# Patient Record
Sex: Male | Born: 1940 | Race: White | Hispanic: No | Smoking: Never smoker
Health system: Southern US, Community
[De-identification: ages and names within clinical notes are randomized; demographics above are authoritative.]

## PROBLEM LIST (undated history)

## (undated) DIAGNOSIS — C61 Malignant neoplasm of prostate: Secondary | ICD-10-CM

## (undated) DIAGNOSIS — I4821 Permanent atrial fibrillation: Secondary | ICD-10-CM

## (undated) DIAGNOSIS — Z7901 Long term (current) use of anticoagulants: Secondary | ICD-10-CM

## (undated) DIAGNOSIS — N390 Urinary tract infection, site not specified: Secondary | ICD-10-CM

## (undated) DIAGNOSIS — M1990 Unspecified osteoarthritis, unspecified site: Secondary | ICD-10-CM

## (undated) DIAGNOSIS — Z972 Presence of dental prosthetic device (complete) (partial): Secondary | ICD-10-CM

## (undated) DIAGNOSIS — N200 Calculus of kidney: Secondary | ICD-10-CM

## (undated) DIAGNOSIS — I519 Heart disease, unspecified: Secondary | ICD-10-CM

## (undated) DIAGNOSIS — I442 Atrioventricular block, complete: Secondary | ICD-10-CM

## (undated) DIAGNOSIS — Z950 Presence of cardiac pacemaker: Secondary | ICD-10-CM

## (undated) DIAGNOSIS — F32A Depression, unspecified: Secondary | ICD-10-CM

## (undated) DIAGNOSIS — N183 Chronic kidney disease, stage 3 unspecified: Secondary | ICD-10-CM

## (undated) DIAGNOSIS — Z9289 Personal history of other medical treatment: Secondary | ICD-10-CM

## (undated) DIAGNOSIS — Z87442 Personal history of urinary calculi: Secondary | ICD-10-CM

## (undated) DIAGNOSIS — D6859 Other primary thrombophilia: Secondary | ICD-10-CM

## (undated) DIAGNOSIS — I10 Essential (primary) hypertension: Secondary | ICD-10-CM

## (undated) DIAGNOSIS — H9190 Unspecified hearing loss, unspecified ear: Secondary | ICD-10-CM

## (undated) DIAGNOSIS — R0600 Dyspnea, unspecified: Secondary | ICD-10-CM

## (undated) DIAGNOSIS — F329 Major depressive disorder, single episode, unspecified: Secondary | ICD-10-CM

## (undated) DIAGNOSIS — E119 Type 2 diabetes mellitus without complications: Secondary | ICD-10-CM

## (undated) DIAGNOSIS — Z973 Presence of spectacles and contact lenses: Secondary | ICD-10-CM

## (undated) DIAGNOSIS — A419 Sepsis, unspecified organism: Secondary | ICD-10-CM

## (undated) HISTORY — DX: Personal history of other medical treatment: Z92.89

## (undated) HISTORY — PX: SHOULDER SURGERY: SHX246

## (undated) HISTORY — PX: RADIOACTIVE SEED IMPLANT: SHX5150

## (undated) HISTORY — DX: Malignant neoplasm of prostate: C61

## (undated) HISTORY — PX: TOTAL KNEE ARTHROPLASTY: SHX125

## (undated) HISTORY — DX: Heart disease, unspecified: I51.9

## (undated) HISTORY — DX: Major depressive disorder, single episode, unspecified: F32.9

## (undated) HISTORY — DX: Presence of cardiac pacemaker: Z95.0

## (undated) HISTORY — DX: Sepsis, unspecified organism: N39.0

## (undated) HISTORY — DX: Morbid (severe) obesity due to excess calories: E66.01

## (undated) HISTORY — DX: Essential (primary) hypertension: I10

## (undated) HISTORY — DX: Unspecified osteoarthritis, unspecified site: M19.90

## (undated) HISTORY — DX: Depression, unspecified: F32.A

## (undated) HISTORY — DX: Calculus of kidney: N20.0

## (undated) HISTORY — DX: Sepsis, unspecified organism: A41.9

## (undated) HISTORY — DX: Long term (current) use of anticoagulants: Z79.01

## (undated) HISTORY — PX: WISDOM TOOTH EXTRACTION: SHX21

## (undated) HISTORY — DX: Permanent atrial fibrillation: I48.21

---

## 1998-12-02 ENCOUNTER — Emergency Department (HOSPITAL_COMMUNITY): Admission: EM | Admit: 1998-12-02 | Discharge: 1998-12-02 | Payer: Self-pay | Admitting: Emergency Medicine

## 1999-11-04 ENCOUNTER — Encounter: Payer: Self-pay | Admitting: Internal Medicine

## 1999-11-04 ENCOUNTER — Ambulatory Visit (HOSPITAL_COMMUNITY): Admission: RE | Admit: 1999-11-04 | Discharge: 1999-11-04 | Payer: Self-pay | Admitting: Internal Medicine

## 2000-06-15 ENCOUNTER — Encounter: Payer: Self-pay | Admitting: Orthopedic Surgery

## 2000-06-22 ENCOUNTER — Inpatient Hospital Stay (HOSPITAL_COMMUNITY): Admission: RE | Admit: 2000-06-22 | Discharge: 2000-06-26 | Payer: Self-pay | Admitting: Orthopedic Surgery

## 2001-05-18 ENCOUNTER — Other Ambulatory Visit: Admission: RE | Admit: 2001-05-18 | Discharge: 2001-05-18 | Payer: Self-pay | Admitting: Urology

## 2001-05-18 ENCOUNTER — Encounter (INDEPENDENT_AMBULATORY_CARE_PROVIDER_SITE_OTHER): Payer: Self-pay | Admitting: Specialist

## 2001-06-16 ENCOUNTER — Ambulatory Visit: Admission: RE | Admit: 2001-06-16 | Discharge: 2001-09-14 | Payer: Self-pay | Admitting: Radiation Oncology

## 2001-07-13 ENCOUNTER — Encounter: Payer: Self-pay | Admitting: Urology

## 2001-07-27 ENCOUNTER — Ambulatory Visit (HOSPITAL_COMMUNITY): Admission: RE | Admit: 2001-07-27 | Discharge: 2001-07-27 | Payer: Self-pay | Admitting: Urology

## 2001-07-27 ENCOUNTER — Encounter: Payer: Self-pay | Admitting: Urology

## 2005-08-05 ENCOUNTER — Encounter: Admission: RE | Admit: 2005-08-05 | Discharge: 2005-08-05 | Payer: Self-pay | Admitting: Internal Medicine

## 2007-09-14 ENCOUNTER — Encounter: Admission: RE | Admit: 2007-09-14 | Discharge: 2007-09-14 | Payer: Self-pay | Admitting: Orthopedic Surgery

## 2007-10-20 ENCOUNTER — Ambulatory Visit (HOSPITAL_COMMUNITY): Admission: RE | Admit: 2007-10-20 | Discharge: 2007-10-21 | Payer: Self-pay | Admitting: Orthopedic Surgery

## 2008-09-14 ENCOUNTER — Inpatient Hospital Stay (HOSPITAL_COMMUNITY): Admission: EM | Admit: 2008-09-14 | Discharge: 2008-09-19 | Payer: Self-pay | Admitting: Emergency Medicine

## 2008-09-15 ENCOUNTER — Ambulatory Visit: Payer: Self-pay | Admitting: Surgery

## 2008-09-15 ENCOUNTER — Encounter (INDEPENDENT_AMBULATORY_CARE_PROVIDER_SITE_OTHER): Payer: Self-pay | Admitting: Internal Medicine

## 2008-10-06 ENCOUNTER — Ambulatory Visit (HOSPITAL_COMMUNITY): Admission: RE | Admit: 2008-10-06 | Discharge: 2008-10-06 | Payer: Self-pay | Admitting: Urology

## 2008-10-13 ENCOUNTER — Ambulatory Visit (HOSPITAL_COMMUNITY): Admission: RE | Admit: 2008-10-13 | Discharge: 2008-10-13 | Payer: Self-pay | Admitting: Urology

## 2008-11-03 ENCOUNTER — Ambulatory Visit (HOSPITAL_COMMUNITY): Admission: RE | Admit: 2008-11-03 | Discharge: 2008-11-03 | Payer: Self-pay | Admitting: Urology

## 2008-11-17 DIAGNOSIS — Z950 Presence of cardiac pacemaker: Secondary | ICD-10-CM

## 2008-11-17 HISTORY — PX: PACEMAKER PLACEMENT: SHX43

## 2008-11-17 HISTORY — DX: Presence of cardiac pacemaker: Z95.0

## 2008-12-11 ENCOUNTER — Inpatient Hospital Stay (HOSPITAL_COMMUNITY): Admission: RE | Admit: 2008-12-11 | Discharge: 2008-12-12 | Payer: Self-pay | Admitting: Cardiology

## 2008-12-18 ENCOUNTER — Encounter: Admission: RE | Admit: 2008-12-18 | Discharge: 2008-12-18 | Payer: Self-pay | Admitting: Cardiology

## 2009-02-15 HISTORY — PX: CARDIOVERSION: SHX1299

## 2009-02-19 ENCOUNTER — Ambulatory Visit (HOSPITAL_COMMUNITY): Admission: RE | Admit: 2009-02-19 | Discharge: 2009-02-19 | Payer: Self-pay | Admitting: Cardiology

## 2009-09-12 IMAGING — CR DG CHEST 2V
2 series · 2 of 2 positions shown · non-contrast
Comparison: Chest x-ray of 12/12/2008

CLINICAL DATA: Pacemaker insertion 1 week ago, some shortness of
breath

CHEST - 2 VIEW

[w chest pa]
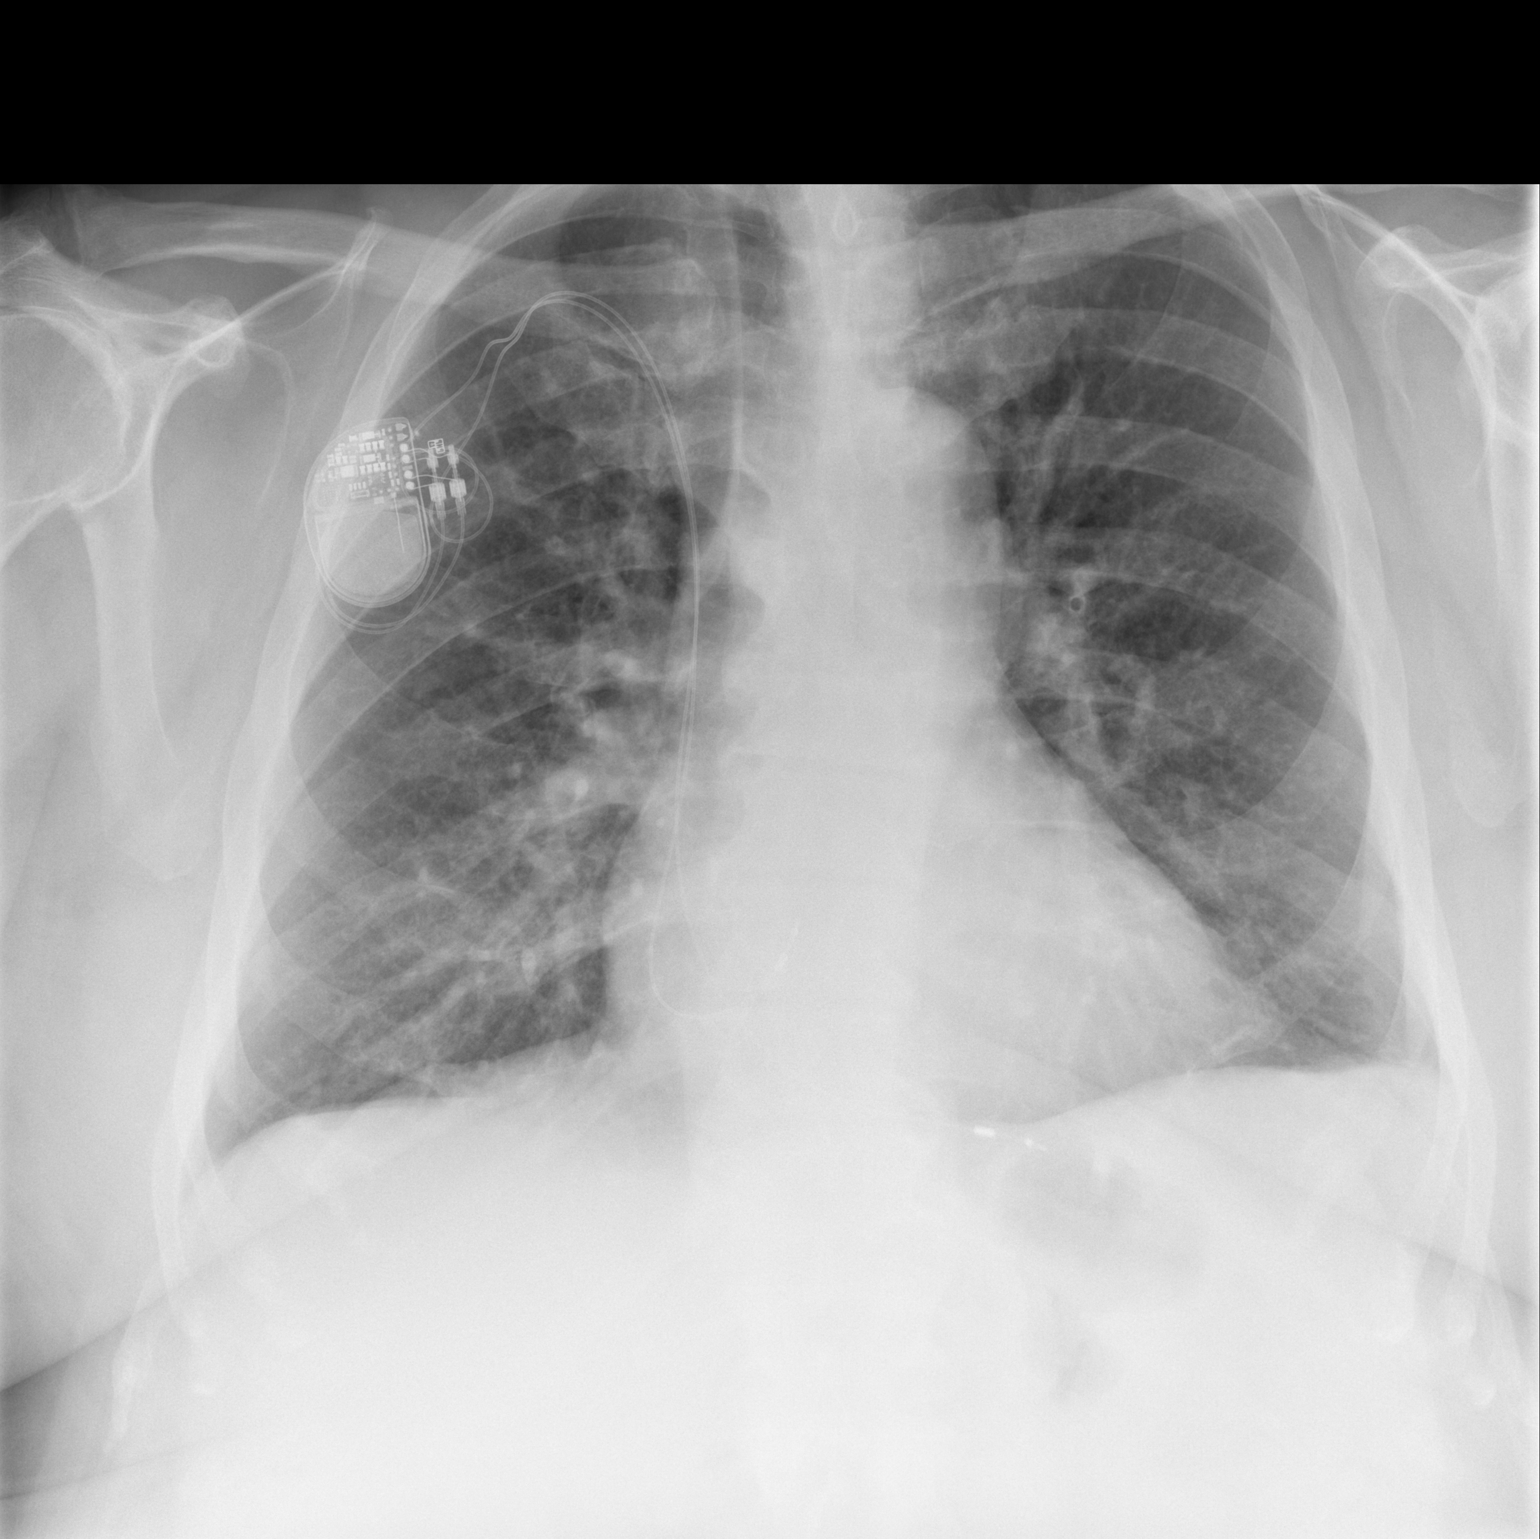

[w chest lat]
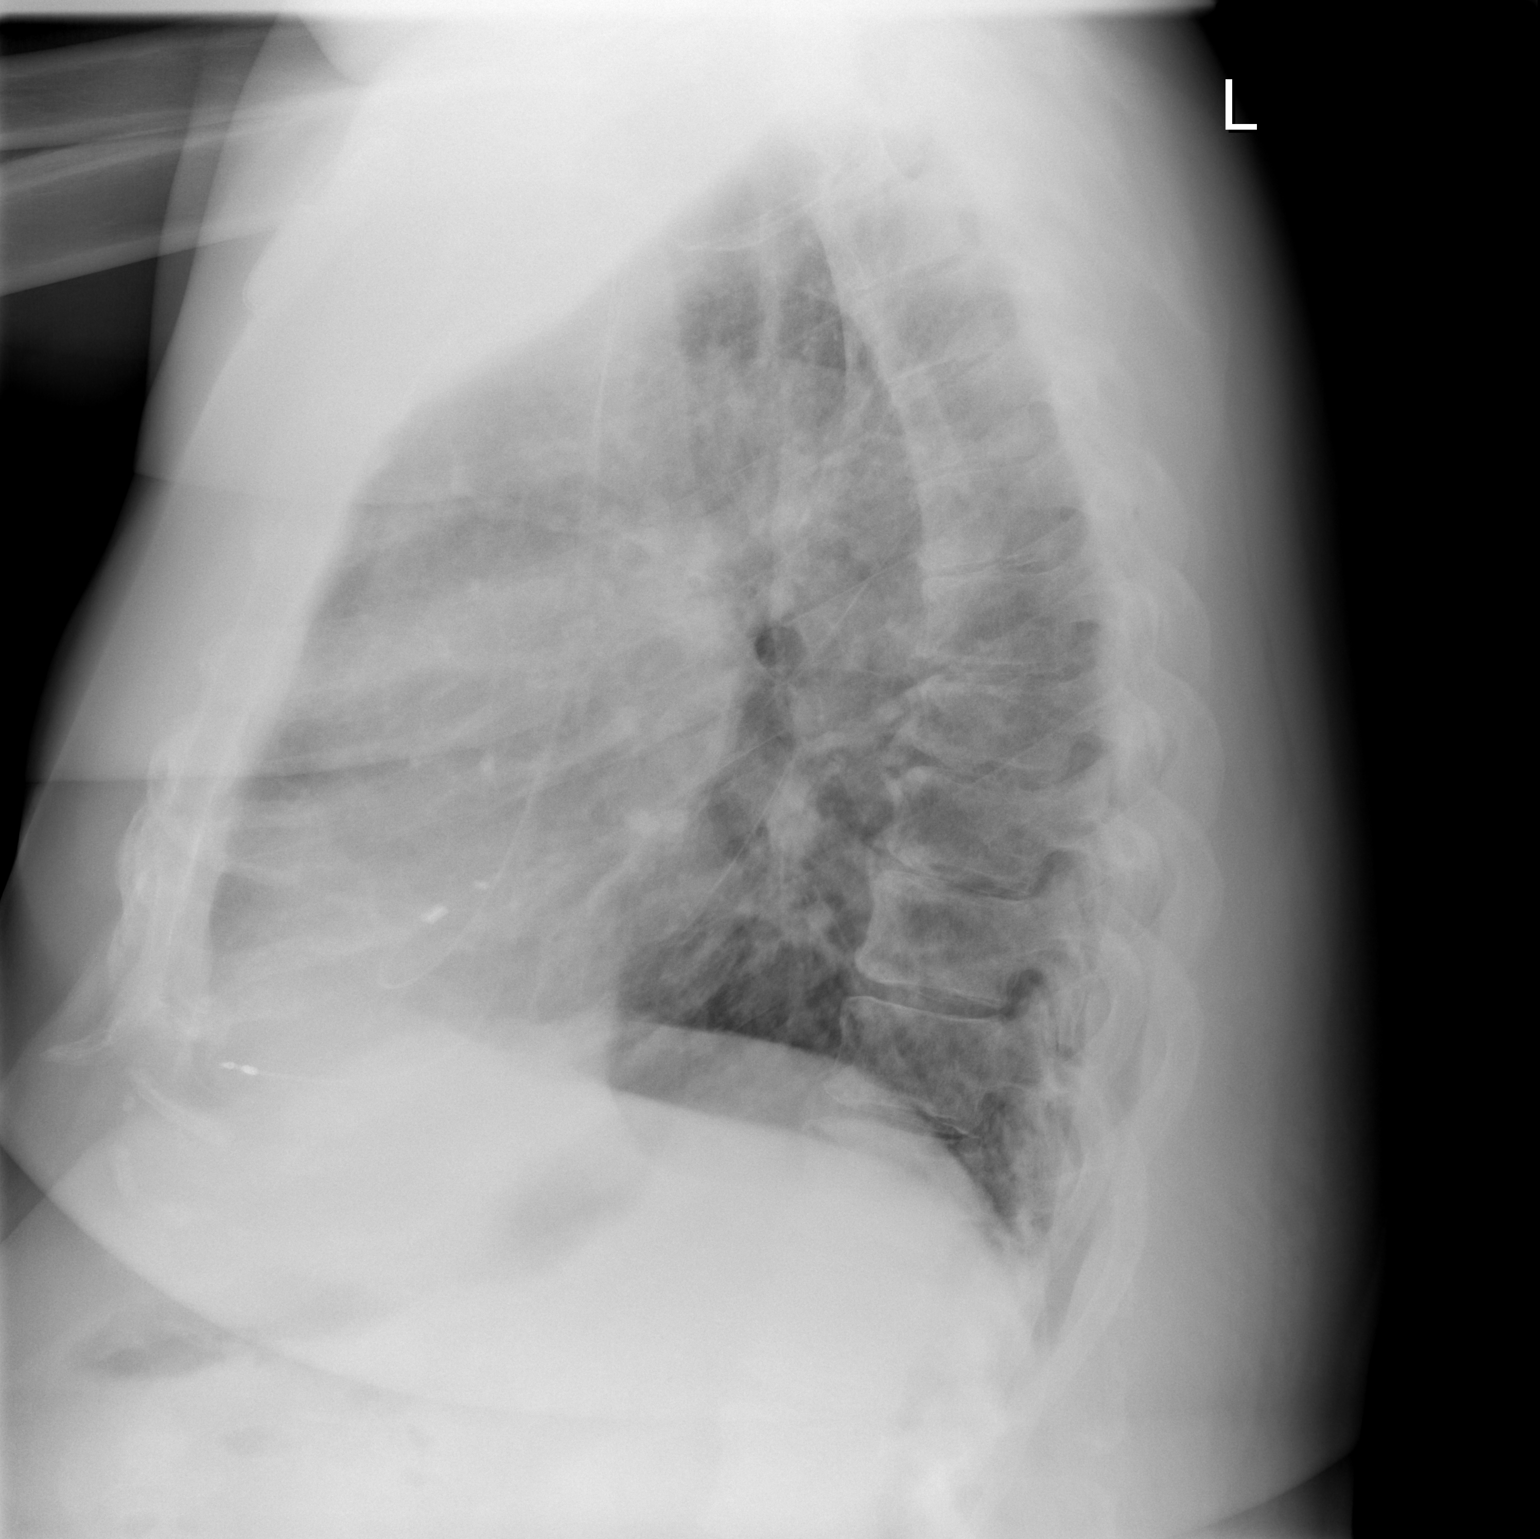

[2 of 2 positions shown; findings below may reference images not displayed]

FINDINGS: The heart is mildly enlarged and very mild pulmonary
vascular congestion is unchanged as noted previously.  A permanent
pacemaker remains.  No pneumothorax is seen.  No bony abnormality
is noted.
IMPRESSION: No significant change in mild cardiomegaly and mild pulmonary
vascular congestion.  No change in pacer.  No pneumothorax.

## 2010-07-31 ENCOUNTER — Ambulatory Visit: Payer: Self-pay | Admitting: Cardiology

## 2010-08-24 ENCOUNTER — Encounter: Payer: Self-pay | Admitting: Internal Medicine

## 2010-08-30 ENCOUNTER — Encounter: Payer: Self-pay | Admitting: Internal Medicine

## 2010-08-30 ENCOUNTER — Ambulatory Visit: Payer: Self-pay | Admitting: Cardiology

## 2010-09-09 ENCOUNTER — Ambulatory Visit: Payer: Self-pay | Admitting: Internal Medicine

## 2010-09-30 ENCOUNTER — Ambulatory Visit: Payer: Self-pay | Admitting: Cardiology

## 2010-10-29 ENCOUNTER — Ambulatory Visit: Payer: Self-pay | Admitting: Cardiology

## 2010-12-02 ENCOUNTER — Ambulatory Visit: Payer: Self-pay | Admitting: Cardiology

## 2010-12-13 ENCOUNTER — Ambulatory Visit
Admission: RE | Admit: 2010-12-13 | Discharge: 2010-12-13 | Payer: Self-pay | Source: Home / Self Care | Attending: Internal Medicine | Admitting: Internal Medicine

## 2010-12-13 ENCOUNTER — Encounter: Payer: Self-pay | Admitting: Internal Medicine

## 2010-12-13 DIAGNOSIS — E669 Obesity, unspecified: Secondary | ICD-10-CM | POA: Insufficient documentation

## 2010-12-13 DIAGNOSIS — I10 Essential (primary) hypertension: Secondary | ICD-10-CM | POA: Insufficient documentation

## 2010-12-13 DIAGNOSIS — I4821 Permanent atrial fibrillation: Secondary | ICD-10-CM | POA: Insufficient documentation

## 2010-12-13 DIAGNOSIS — I442 Atrioventricular block, complete: Secondary | ICD-10-CM | POA: Insufficient documentation

## 2010-12-17 NOTE — Miscellaneous (Signed)
Summary: Device preload  Clinical Lists Changes  Observations: Added new observation of PPM INDICATN: Sick sinus syndrome (08/24/2010 15:55) Added new observation of MAGNET RTE: BOL 85 ERI 65 (08/24/2010 15:55) Added new observation of PPMLEADSTAT2: active (08/24/2010 15:55) Added new observation of PPMLEADSER2: [id number]  (08/24/2010 15:55) Added new observation of PPMLEADMOD2: 4471  (08/24/2010 15:55) Added new observation of PPMLEADDOI2: 12/11/2008  (08/24/2010 15:55) Added new observation of PPMLEADLOC2: RV  (08/24/2010 15:55) Added new observation of PPMLEADSTAT1: active  (08/24/2010 15:55) Added new observation of PPMLEADSER1: [id number]  (08/24/2010 15:55) Added new observation of PPMLEADMOD1: 4470  (08/24/2010 15:55) Added new observation of PPMLEADDOI1: 12/11/2008  (08/24/2010 15:55) Added new observation of PPMLEADLOC1: RA  (08/24/2010 15:55) Added new observation of PPM IMP MD: Dorothyann Peng Tennant,MD  (08/24/2010 15:55) Added new observation of PPM DOI: 12/11/2008  (08/24/2010 15:55) Added new observation of PPM SERL#: [id number]  (08/24/2010 15:55) Added new observation of PPM MODL#: [id number]  (08/24/2010 15:55) Added new observation of PACEMAKERMFG: Medtronic  (08/24/2010 15:55) Added new observation of PPM REFER MD: Romeo Apple, MD  (08/24/2010 15:55) Added new observation of PACEMAKER MD: Thompson Grayer, MD  (08/24/2010 15:55)      PPM Specifications Following MD:  Thompson Grayer, MD     Referring MD:  Romeo Apple, MD PPM Vendor:  Medtronic     PPM Model Number:  [id number]     PPM Serial Number:  [id number] PPM DOI:  12/11/2008     PPM Implanting MD:  Servando Salina  Lead 1    Location: RA     DOI: 12/11/2008     Model #: [id number]     Serial #: [id number]     Status: active Lead 2    Location: RV     DOI: 12/11/2008     Model #: [id number]     Serial #: [id number]     Status: active  Magnet Response Rate:  BOL 85 ERI 65  Indications:  Sick sinus syndrome

## 2010-12-17 NOTE — Cardiovascular Report (Signed)
Summary: Office Visit   Office Visit   Imported By: Sallee Provencal 09/16/2010 16:04:24  _____________________________________________________________________  External Attachment:    Type:   Image     Comment:   External Document

## 2010-12-17 NOTE — Procedures (Signed)
Summary: pacer check/medtronic   Current Medications (verified): 1)  Allopurinol 100 Mg Tabs (Allopurinol) .... One By Mouth Daily 2)  Coumadin 7.5 Mg Tabs (Warfarin Sodium) .... As Directed 3)  Celebrex 200 Mg Caps (Celecoxib) .... One Every Other Day  Allergies (verified): 1)  ! Zoloft  PPM Specifications Following MD:  Thompson Grayer, MD     Referring MD:  Romeo Apple, MD PPM Vendor:  Medtronic     PPM Model Number:  [id number]     PPM Serial Number:  [id number] PPM DOI:  12/11/2008     PPM Implanting MD:  Servando Salina  Lead 1    Location: RA     DOI: 12/11/2008     Model #: [id number]     Serial #: [id number]     Status: active Lead 2    Location: RV     DOI: 12/11/2008     Model #: [id number]     Serial #: [id number]     Status: active  Magnet Response Rate:  BOL 85 ERI 65  Indications:  Sick sinus syndrome   PPM Follow Up Remote Check?  No Battery Voltage:  2.97 V       PPM Device Measurements Atrium  Amplitude: 0.4 mV, Impedance: 448 ohms,  Right Ventricle  Amplitude: 11.4 mV, Impedance: 512 ohms, Threshold: 0.5 V at 0.4 msec  Episodes MS Episodes:  1     Percent Mode Switch:  100%     Coumadin:  Yes Ventricular High Rate:  0     Ventricular Pacing:  99.1%  Parameters Mode:  VVIR     Lower Rate Limit:  70     Upper Rate Limit:  130 Tech Comments:  PT IN AF 100% OF TIME.  + COUMADIN.  NORMAL DEVICE FUNCTION. NO CHANGES MADE. PT WANTS TO BE ENROLLED IN [facility].   ROV IN 3 MTHS W/JA. Shelly Bombard  September 10, 2010 8:17 AM

## 2010-12-19 NOTE — Assessment & Plan Note (Signed)
Summary: pc2   Visit Type:  Pacemaker check   History of Present Illness: [name] is a pleasant 70 yo WM with a h/o obesity, permanent atrial fibrillation, and bradycardia s/p PPM (MDT) implanted by Dr Doreatha Lew who presents today to establish care in the EP clinic.  He reports doing reasonably well recently.  He continues to struggle with obesity and decreased exercise tolerance.  He sleeps in a chair due to orthopedic/ back issues.  He takes celebrex chronically.  He denies symptoms of palpitations, chest pain,  dizziness, presyncope, syncope, or neurologic sequela. The patient is tolerating medications without difficulties and is otherwise without complaint today.   Current Medications (verified): 1)  Allopurinol 100 Mg Tabs (Allopurinol) .... One By Mouth Daily 2)  Coumadin 7.5 Mg Tabs (Warfarin Sodium) .... As Directed 3)  Celebrex 200 Mg Caps (Celecoxib) .... One Every Other Day  Allergies (verified): 1)  ! Zoloft  Past History:  Past Medical History: permanent atrial fibrillation bradycardia s/p PPM obesity venous insufficiency HTN DDD/DJD Gout  Past Surgical History: PPM (MDT) by Dr Doreatha Lew Jan. 25, 2010  Family History: Reviewed history from 12/12/2010 and no changes required. Father died of a heart attack at age 42 and mother   lived until she was 57.  He has 1 brother who is 88.   Social History: Reviewed history from 12/12/2010 and no changes required.  He is a retired Administrator.  He lives alone.  His   wife died of cancer in her mid 83s.  He has no alcohol or tobacco use.      Review of Systems       All systems are reviewed and negative except as listed in the HPI.   Vital Signs:  Patient profile:   70 year old male Height:      71 inches Weight:      370 pounds BMI:     51.79 Pulse rate:   80 / minute BP sitting:   128 / 70  (left arm) Cuff size:   regular  Vitals Entered By: Mignon Pine, RMA (December 13, 2010 12:32 PM)  Physical  Exam  General:  morbidly obese, NAD Head:  normocephalic and atraumatic Eyes:  PERRLA/EOM intact; conjunctiva and lids normal. Mouth:  Teeth, gums and palate normal. Oral mucosa normal. Neck:  supple Chest Wall:  pacemaker pocket is well healed Lungs:  Clear bilaterally to auscultation and percussion. Heart:  RRR (paced) Abdomen:  Bowel sounds positive; abdomen soft and non-tender without masses, organomegaly, or hernias noted. No hepatosplenomegaly. Msk:  Back normal, normal gait. Muscle strength and tone normal. Extremities:  No clubbing or cyanosis. chronic venous stasis changes Neurologic:  Alert and oriented x 3. Skin:  Intact without lesions or rashes.   PPM Specifications Following MD:  Thompson Grayer, MD     Referring MD:  Romeo Apple, MD PPM Vendor:  Medtronic     PPM Model Number:  [id number]     PPM Serial Number:  [id number] H PPM DOI:  12/11/2008     PPM Implanting MD:  Servando Salina  Lead 1    Location: RA     DOI: 12/11/2008     Model #: [id number]     Serial #: [id number]     Status: active Lead 2    Location: RV     DOI: 12/11/2008     Model #: [id number]     Serial #: [id number]     Status: active  Magnet Response Rate:  BOL 85 ERI 65  Indications:  Sick sinus syndrome   PPM Follow Up Remote Check?  No Battery Voltage:  2.97 V     Pacer Dependent:  No       PPM Device Measurements Atrium  Amplitude: 0.15 mV,  Right Ventricle  Amplitude: 15.5 mV, Impedance: 504 ohms, Threshold: 0.5 V at 0.4 msec  Episodes Percent Mode Switch:  100%     Coumadin:  Yes Ventricular Pacing:  97.8%  Parameters Mode:  VVIR     Lower Rate Limit:  70     Upper Rate Limit:  130 Next Remote Date:  03/13/2011     Next Cardiology Appt Due:  11/18/2011 Tech Comments:  No parameter changes.  Device function normal.  A-fib, ventricular rates controlled, + coumadin.  [facility] transmissions every 3 months.  ROV 1 year with [name]. Alma Friendly, LPN  January 27, [id number] 12:43 PM  MD Comments:   agree  Impression & Recommendations:  Problem # 1:  ATRIAL FIBRILLATION, PAROXYSMAL, CHRONIC (ICD-427.31) continue coumadin longterm rate controlled  Problem # 2:  BRADYCARDIA-TACHYCARDIA SYNDROME (ICD-427.81) normal pacemaker function as above  Problem # 3:  ESSENTIAL HYPERTENSION, BENIGN (ICD-401.1) stable salt restriction celebrex use is discouraged  Problem # 4:  MORBID OBESITY (ICD-278.01) weight loss advised

## 2010-12-25 NOTE — Cardiovascular Report (Signed)
Summary: Office Visit   Office Visit   Imported By: Sallee Provencal 12/18/2010 15:19:19  _____________________________________________________________________  External Attachment:    Type:   Image     Comment:   External Document

## 2010-12-26 ENCOUNTER — Other Ambulatory Visit: Payer: Self-pay

## 2010-12-27 ENCOUNTER — Other Ambulatory Visit (INDEPENDENT_AMBULATORY_CARE_PROVIDER_SITE_OTHER): Payer: MEDICARE

## 2010-12-27 DIAGNOSIS — Z7901 Long term (current) use of anticoagulants: Secondary | ICD-10-CM

## 2010-12-27 DIAGNOSIS — I4891 Unspecified atrial fibrillation: Secondary | ICD-10-CM

## 2011-01-07 ENCOUNTER — Ambulatory Visit (INDEPENDENT_AMBULATORY_CARE_PROVIDER_SITE_OTHER): Payer: MEDICARE | Admitting: Cardiology

## 2011-01-07 DIAGNOSIS — Z7901 Long term (current) use of anticoagulants: Secondary | ICD-10-CM

## 2011-01-07 DIAGNOSIS — I4891 Unspecified atrial fibrillation: Secondary | ICD-10-CM

## 2011-01-07 DIAGNOSIS — I10 Essential (primary) hypertension: Secondary | ICD-10-CM

## 2011-01-14 NOTE — Letter (Signed)
Summary: [facility]   Imported By: Marilynne Drivers 01/06/2011 14:51:10  _____________________________________________________________________  External Attachment:    Type:   Image     Comment:   External Document

## 2011-02-10 ENCOUNTER — Encounter: Payer: Self-pay | Admitting: Nurse Practitioner

## 2011-02-10 ENCOUNTER — Ambulatory Visit (INDEPENDENT_AMBULATORY_CARE_PROVIDER_SITE_OTHER): Payer: MEDICARE | Admitting: Nurse Practitioner

## 2011-02-10 VITALS — BP 112/76 | HR 72 | Wt 370.0 lb

## 2011-02-10 DIAGNOSIS — I4891 Unspecified atrial fibrillation: Secondary | ICD-10-CM

## 2011-02-10 DIAGNOSIS — Z950 Presence of cardiac pacemaker: Secondary | ICD-10-CM

## 2011-02-10 DIAGNOSIS — I10 Essential (primary) hypertension: Secondary | ICD-10-CM

## 2011-02-10 LAB — BASIC METABOLIC PANEL
BUN: 22 mg/dL (ref 6–23)
CO2: 28 mEq/L (ref 19–32)
Calcium: 9.3 mg/dL (ref 8.4–10.5)
Chloride: 100 mEq/L (ref 96–112)
Creat: 1.11 mg/dL (ref 0.40–1.50)
Glucose, Bld: 135 mg/dL — ABNORMAL HIGH (ref 70–99)
Potassium: 3.7 mEq/L (ref 3.5–5.3)
Sodium: 140 mEq/L (ref 135–145)

## 2011-02-10 NOTE — Assessment & Plan Note (Signed)
Encouraged him to cut back on the soda. His obesity will be his most limiting factor for good health.

## 2011-02-10 NOTE — Assessment & Plan Note (Signed)
Rate is controlled. On coumadin.

## 2011-02-10 NOTE — Progress Notes (Signed)
History of Present Illness: [name] is seen back today for a follow up visit. He is seen for Dr. Doreatha Lew. He has had Losartan added to his regimen.He continues on his HCTZ.  He feels good. He does not check his blood pressure at home. He remains limited due to his morbid obesity. He is not having chest pain. He is not dizzy.   Current Outpatient Prescriptions on File Prior to Visit  Medication Sig Dispense Refill  . allopurinol (ZYLOPRIM) 300 MG tablet Take 300 mg by mouth daily.        . celecoxib (CELEBREX) 200 MG capsule Take 200 mg by mouth every other day.        . warfarin (COUMADIN) 7.5 MG tablet Take 7.5 mg by mouth daily.        Marland Kitchen DISCONTD: hydrochlorothiazide 25 MG tablet Take 25 mg by mouth daily.        Marland Kitchen DISCONTD: losartan-hydrochlorothiazide (HYZAAR) 50-12.5 MG per tablet Take 1 tablet by mouth daily.          Allergies  Allergen Reactions  . Oxycodone   . Sertraline Hcl     Past Medical History  Diagnosis Date  . Chronic a-fib     failed cardioversion in April of 2010  . Chronic anticoagulation   . Morbid obesity   . Osteoarthritis   . HTN (hypertension)   . 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 Urosepsis   . Prostate ca   . Nephrolithiasis   . Depression   . Gout   . Arthritis     Past Surgical History  Procedure Date  . Pacemaker placement 2010    Medtronic  . Total knee arthroplasty   . Radioactive seed implant   . Shoulder surgery     History  Smoking status  . Never Smoker   Smokeless tobacco  . Not on file    History  Alcohol Use No    Family History  Problem Relation Age of Onset  . Heart attack Father     Review of Systems: The review of systems is positive for DOE. He is limited with his mobility due to morbid obesity. He is back drinking sodas. Weight is up 3 pounds. No chest pain.   All other systems were reviewed and are negative.  Physical Exam: BP 112/76  Pulse 72  Wt 370 lb (167.831 kg) He is a pleasant morbidly obese male, in no acute  distress. Skin is warm and dry. Color is normal. HEENT is unremarkable. Lungs are fairly clear. Cardiac exam shows an irregular irregular rhythm. Rate is controlled. Abdomen is obese. Extremities are without edema. Gait is steady. ROM is intact. He has no gross neurologic deficits.  ECG: N/A  Assessment / Plan:

## 2011-02-10 NOTE — Patient Instructions (Addendum)
Continue your HCTZ 25 mg daily. Stay on the Losartan 50mg  daily   We are going to check lab today.  Continue your current dose of coumadin and we will recheck your INR in 4 weeks  See Cecille Rubin in about 3 months  No Soda!!

## 2011-02-10 NOTE — Assessment & Plan Note (Signed)
Followed in device clinic.

## 2011-02-10 NOTE — Assessment & Plan Note (Signed)
Blood pressure is at goal. Will continue the losartan and HCTZ. Cannot do a combo (Hyzaar 50/25). Dose does not exist. I will see him back in about 3 to 4 months.

## 2011-02-11 ENCOUNTER — Telehealth: Payer: Self-pay | Admitting: *Deleted

## 2011-02-11 NOTE — Telephone Encounter (Signed)
Pt notified of BMET results and told to continue same medications.

## 2011-02-11 NOTE — Telephone Encounter (Signed)
Message copied by Raelene Bott on Tue Feb 11, 2011  2:22 PM ------      Message from: Truitt Merle      Created: Tue Feb 11, 2011  7:46 AM       BMET is ok. Continue with current meds.

## 2011-03-03 ENCOUNTER — Encounter: Payer: MEDICARE | Admitting: *Deleted

## 2011-03-03 LAB — GLUCOSE, CAPILLARY: Glucose-Capillary: 142 mg/dL — ABNORMAL HIGH (ref 70–99)

## 2011-03-04 ENCOUNTER — Ambulatory Visit (INDEPENDENT_AMBULATORY_CARE_PROVIDER_SITE_OTHER): Payer: MEDICARE | Admitting: *Deleted

## 2011-03-04 DIAGNOSIS — I4891 Unspecified atrial fibrillation: Secondary | ICD-10-CM

## 2011-03-04 DIAGNOSIS — Z7901 Long term (current) use of anticoagulants: Secondary | ICD-10-CM

## 2011-03-09 ENCOUNTER — Other Ambulatory Visit: Payer: Self-pay | Admitting: Cardiology

## 2011-03-13 ENCOUNTER — Encounter: Payer: Self-pay | Admitting: *Deleted

## 2011-03-16 ENCOUNTER — Encounter: Payer: Self-pay | Admitting: *Deleted

## 2011-03-24 ENCOUNTER — Telehealth: Payer: Self-pay | Admitting: Internal Medicine

## 2011-03-24 NOTE — Telephone Encounter (Signed)
Spoke with patient and reviewed transmission instructions.  He will attempt another transmission later this evening.

## 2011-03-24 NOTE — Telephone Encounter (Signed)
Pt trying a transmission and he is having trouble when I asked him to call number on box he said he didn't have a box

## 2011-04-01 ENCOUNTER — Ambulatory Visit (INDEPENDENT_AMBULATORY_CARE_PROVIDER_SITE_OTHER): Payer: MEDICARE | Admitting: *Deleted

## 2011-04-01 DIAGNOSIS — I4891 Unspecified atrial fibrillation: Secondary | ICD-10-CM

## 2011-04-01 DIAGNOSIS — Z7901 Long term (current) use of anticoagulants: Secondary | ICD-10-CM

## 2011-04-01 LAB — POCT INR: INR: 2.7

## 2011-04-01 NOTE — Op Note (Signed)
[name], [name]                 ACCOUNT NO.:  [id number]   MEDICAL RECORD NO.:  [id number]          PATIENT TYPE:  AMB   LOCATION:  DAY                          FACILITY:  [facility]   PHYSICIAN:  Ronald L. Rosana Hoes, M.D.  DATE OF BIRTH:  [birth year]   DATE OF PROCEDURE:  10/06/2008  DATE OF DISCHARGE:                               OPERATIVE REPORT   PREOPERATIVE DIAGNOSIS:  Left ureterolithiasis with hydronephrosis and  resolved urosepsis.   OPERATIVE PROCEDURE:  Cystourethroscopy, left retrograde pyelogram,  placement of left double-J stent, and dilatation of fossa navicularis  stricture.   SURGEON:  Tresa Endo, M.D.   ANESTHESIA:  General.   ESTIMATED BLOOD LOSS:  Negligible.   TUBES:  26-cm, 6-French contoured double pigtail stent.   COMPLICATIONS:  None.   INDICATIONS FOR PROCEDURE:  [name] is a very nice 70 year old obese  male who presented to [facility] in urosepsis.  He was found to have  a 7-mm upper ureteral obstructing calculus and hydronephrosis and  subsequently underwent placement of left percutaneous nephrostomy.  The  urosepsis is resolved.  He is in chronic atrial fibula ablation with  slow ventricular response.  He notes he has not tolerated Coumadin, and  that has not really been resolved at this point, but he has not had  problems before, and he presents for cystourethroscopy and placement of  left double-J stent in preparation of removing the percutaneous  nephrostomy and performing a secondary ureteroscopy.  He is too large  for ESWL.  Understanding risks, benefits and alternatives, he has  elected to proceed.   PROCEDURE IN DETAIL:  The patient was placed in supine position.  After  proper general anesthesia, he was placed in the dorsal lithotomy  position and prepped and draped with Betadine in sterile fashion.  Cystourethroscopy was performed with a 22.5 [place] Olympus panendoscope.  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  With filiforms and followers, this was  dilated to 30-French.  The remainder of the urethra was widely patent,  and there was mild trilobar hypertrophy.  Bladder was inspected and  noted to have grade 1 trabeculation, and both ureteral orifices were  normal in location.  Under fluoroscopic guidance, a 0.03 French sensor  wire was placed.  We were able to go by the stone into the left renal  pelvis next to nephrostomy tube.  The stone was noted be highly  impacted.  However, a 6-French open-ended catheter was passed and was  eventually gotten by the stone, but it was very fibrotic.  Retrograde  pyelogram was then performed with 6-French open-ended catheter, and  there were no filling defects noted, but the pigtail of the left  percutaneous nephrostomy was noted to be in good position.  The 6-French  open-ended catheter was removed after a wire had been replaced.  Under  fluoroscopic guidance, a 26-cm 6-French contour double pigtail stent was  placed and noted to be in good position within the left renal pelvis  within the bladder fluoroscopically.  The  bladder was drained.  Panendoscope was removed.  No string was attached.  The patient was  taken to the recovery room stable.   Plans is to hopefully remove the left nephrostomy tube next week and  then to perform superior a secondary ureteroscopy at a later date.      Yale Rosana Hoes, M.D.  Electronically Signed     RLD/MEDQ  D:  10/06/2008  T:  10/06/2008  Job:  [id number]

## 2011-04-01 NOTE — Discharge Summary (Signed)
NAMEEDUAR, COUZENS                 ACCOUNT NO.:  [id number]   MEDICAL RECORD NO.:  [id number]          PATIENT TYPE:  INP   LOCATION:  5532                         FACILITY:  [facility]   PHYSICIAN:  Ashby Dawes. Polite, M.D. DATE OF BIRTH:  Aug 07, 1941   DATE OF ADMISSION:  09/14/2008  DATE OF DISCHARGE:  09/19/2008                               DISCHARGE SUMMARY   DISCHARGE DIAGNOSES:  1. Enterococcal urinary tract infection/bacteremia, sensitive to      ampicillin.  The patient is to complete 2-week course of      ampicillin.  2. Left hydronephrosis secondary to obstructing stone.  3. Acute renal failure secondary to obstructive pathology, creatinine      on admission 2.3, creatinine at discharge 1.5.  4. Atrial fibrillation, new onset.  The patient is a poor Coumadin      candidate secondary to poor medical compliance.  The patient is to      be on aspirin for cerebrovascular accident prophylaxis after      resolution of hematuria from percutaneous drain.  5. Hydronephrosis secondary to obstructive pathology status post left      percutaneous drain by Interventional Radiology, being discharged      with drain in place secondary to unsuccessful deployment of JJ      stent.  6. Gout.  7. Morbid obesity  8. History of prostate cancer.  9. Hypertriglyceridemia.  10.Depression.   DISCHARGE MEDICATIONS:  1. Ampicillin 500 mg q.6 h. x10 days.  2. Allopurinol.  3. Percocet 5/325 one tablet q.8 h. p.r.n. pain.   CONSULTANTS:  1. Interventional radiology.  2. Urology.   PROCEDURE:  The patient underwent percutaneous drain by Interventional  Radiology.  The patient had unsuccessful placement of JJ stent.   STUDIES:  1. A 2-D echo.  Normal left ventricular function, ejection fraction 55-      60%.  Left atrium was mildly dilated.  2. The patient had a Ventilation/perfusion scan, low probability for      pulmonary embolism.  3. The patient had an unsuccessful attempt at placement of  antegrade      stent.  Did get a new 10-French nephrostomy tube placed.  4. CT of the abdomen and pelvis on September 14, 2008, moderate left      hydronephrosis extending to a 7-mm calculus in the proximal left      ureter associated with perinephric and peripelvic inflammatory      changes.  Urine culture, greater than 100,000 Enterococcus.  Blood      culture x2 showed Enterococcus.   DISPOSITION:  The patient is being discharged to home with a nephrostomy  tube in place.  He is asked to follow up with Urology post-completion of  his antibiotics.  Home health nurse will be arranged.   HISTORY OF PRESENT ILLNESS:  This is a 70 year old male who presented to  the hospital with complaint of abdominal pain.  In ED, the patient was  evaluated, found to be in AFib.  Ultimately, he had CT which showed left  hydronephrosis.  Admission was deemed necessary for further  evaluation  and treatment.  Please see dictated H and P for further details.   PAST MEDICAL HISTORY:  As stated above significant for:  1. Depression.  2. Gout.  3. Hypertriglyceridemia.  4. Osteoarthritis.  5. Morbid obesity.  6. Prostate cancer.   Meds on admission include allopurinol and Celebrex.   SOCIAL HISTORY:  Per admission H and P.  No alcohol, no drugs.   PAST SURGICAL HISTORY:  Per admission H and [place] COURSE:  1. The patient was admitted to a [facility] for evaluation and      treatment of his multiple medical problems.  For his left      hydronephrosis, the patient was seen in consultation by Urology,      Interventional Radiology.  He had a nephrostomy tube placed.  An      attempt was made to place an antegrade stent; however, it was      unsuccessful.  The patient's nephrostomy tube was functioning well      as creatinine has drifted back towards normal.  He will continue      his course of antibiotics and follow up on an outpatient basis with      Urology.  He is without any nausea or  vomiting, without fever or      chills, without leukocytosis, tolerant of full p.o. intake.  2. Acute renal failure secondary to obstructive pathology, resolved      status post nephrostomy tube.  3. AFib, new onset.  Echo as stated above in the data section.  Due to      the patient's poor medical compliance and followup, not felt to be      a safe Coumadin candidate.  After resolution of hematuria from      nephrostomy tube, the patient will start aspirin for CVA      prophylaxis.  TSH and cardiac enzymes were within normal limits.  4. Elevated D-dimer.  V/Q scan, low probability for PE.  The patient      has several other chronic comorbidities.  We will continue meds as      stated above.      Ashby Dawes. Polite, M.D.  Electronically Signed     RDP/MEDQ  D:  09/19/2008  T:  09/19/2008  Job:  [id number]

## 2011-04-01 NOTE — Op Note (Signed)
NAMEGRANDVILLE, GALEY                 ACCOUNT NO.:  [id number]   MEDICAL RECORD NO.:  [id number]          PATIENT TYPE:  AMB   LOCATION:  DAY                          FACILITY:  [facility]   PHYSICIAN:  Ronald L. Rosana Hoes, M.D.  DATE OF BIRTH:  [birth year]   DATE OF PROCEDURE:  11/03/2008  DATE OF DISCHARGE:  11/03/2008                               OPERATIVE REPORT   This is a redictation.   DIAGNOSIS:  Left ureteropelvic junction obstruction with urosepsis and a  7 x 6 mm impacted upper ureteral calculus.   OPERATIVE PROCEDURE:  Cystourethroscopy, left retrograde ureteral  pyelogram, left ureteroscopy with laser lithotripsy and basket stone  extraction and replacement of left double-J stent.   SURGEON:  Abbie Sons, MD   ASSISTANT:  Dr. Posey Pronto.   ANESTHESIA:  General.   ESTIMATED BLOOD LOSS:  Negligible.   TUBES:  26 cm 6 French Contour double pigtail stent.   COMPLICATIONS:  None.   INDICATIONS FOR PROCEDURE:  Ms. Tortora is a very nice 70 year old white  male who originally presented with an approximately 6 x 7 mm impacted  upper ureteral stone and urosepsis.  He underwent placement of a left  percutaneous nephrostomy and subsequently improved.  He had other  underlying medical problems.  He subsequently on October 06, 2008,  underwent cystoscopy, left retrograde pyelogram, dilatation of fossa  navicularis stricture and placement of left double-J stent and it was  felt that manipulation of the stone at this time would be quite  dangerous.  He is now for removal of the stone.  He has had his left  percutaneous nephrostomy removed.   PROCEDURE IN DETAIL:  The patient was placed in supine position and  after proper general anesthesia was placed in the dorsal lithotomy  position and prepped and draped with Betadine in sterile fashion.  Cystourethroscopy was performed with a 22.5 [place] Olympus panendoscope.  The previously placed stent in the left ureter was grasped and pulled  to  the meatus.  Under fluoroscopic guidance a 0.838 French sensor wire was  placed through the old stent in the left renal pelvis by the stone and  the old stent was removed.  Ureteroscopy was then performed with a semi-  rigid 6 French ureteroscope.  The stone was visualized and noted to be  impacted.  Utilizing a 365 micron laser fiber the stone which was 7 x 6  mm was fragmented into multiple fragments and each was grasped and  removed with a basket stone extraction and will be submitted for stone  analysis.  Reinspection revealed there were no perforations and no  significant stones in place.  Dye was injected and again there were no  perforations noted.  The ureteroscope was visually removed and under  fluoroscopic guidance a new 26 cm 6 French Contour double  pigtail stent was placed without a tether and noted to be in good  position in the left renal pelvis and within the bladder.  The bladder  was drained and the panendoscope was removed and the patient was taken  to the recovery room  stable.      [place] Rosana Hoes, M.D.  Electronically Signed     RLD/MEDQ  D:  12/23/2008  T:  12/23/2008  Job:  [id number]

## 2011-04-01 NOTE — H&P (Signed)
[name], [name]                 ACCOUNT NO.:  [id number]   MEDICAL RECORD NO.:  [id number]          PATIENT TYPE:  INP   LOCATION:  1843                         FACILITY:  [facility]   PHYSICIAN:  Farris Has, MDDATE OF BIRTH:  December 29, 1940   DATE OF ADMISSION:  09/14/2008  DATE OF DISCHARGE:                              HISTORY & PHYSICAL   PRIMARY CARE PHYSICIAN:  Ashby Dawes. Polite, M.D.   CHIEF COMPLAINT:  Abdominal pain.   The patient is a 70 year old gentleman with history of gout and severe  obesity, who for the past 2 weeks has been having worsening abdominal  pain.  It started as a lower abdominal pain and also had some midline  abdominal pain as well.  It has been on and off for the past 2 weeks.  He had been having occasional fevers; also with overall just malaise and  not feeling well, as well as occasional chest pains (that he attributes  to not sleeping well).  He can't really verbalize more details about his  chest pain as well as he minimizes them very much; that they were not  exertional and did not last a long time.  He was not willing to be more  specific than that about it.  Otherwise the review of systems he had  been having nausea with occasional vomiting.  No diarrhea.  He has not  been passing blood.  He has not had any back pain, has not had any  dysuria.   On presentation to the emergency department he had a CT scan of the  abdomen and pelvis obtained, that showed moderate left hydronephrosis, a  7 mm calculus and perinephric inflammation.  Dr. Rosana Hoes of Urology was  called, and agreed to, as per discussion with radiology, having a DD  tube placed to help with drainage of his ureter.  The patient was  explained to undergo this procedure today, and thereafter admitted to  Medicine for further followup.   Of note, also while in the ED the patient had an abnormal EKG which was  suggestive of atrial fibrillation.  [facility] was called for  admission for Dr. Ashby Dawes. Polite.   REVIEW OF SYSTEMS:  As per above, otherwise unremarkable.   PAST MEDICAL HISTORY:  1. Arthritis, status post left knee repair.  2. Gout.  3. Severe obesity.  Otherwise unremarkable.   FAMILY HISTORY:  Noncontributory.   SOCIAL HISTORY:  The patient denies either drinking or smoking.  Not  married.  Lives alone by himself.   MEDICATIONS:  1. Allopurinol.  2. Celebrex.  Not sure what the doses are.   ALLERGIES:  NO KNOWN DRUG ALLERGIES.   PHYSICAL EXAMINATION:  VITALS:  Temperature 101.1, blood pressure 147/71  and now down to 117/70, heart rate 81, respirations 20, saturations 92%  on room air and 96% on 2 liters.  The patient appears to be a very obese male in no acute distress;  somewhat disheveled.  HEENT:  Head nontraumatic.  NECK:  Obese, but no lymphadenopathy and no JVD noted.  LUNGS:  Clear to auscultation  bilaterally, but somewhat distant.  HEART:  Somewhat irregular, but again distant heart sounds; no murmurs  could be appreciated.  ABDOMEN:  Very obese.  Difficult to examine.  There may be some mild  epigastric tenderness, but otherwise unremarkable.  EXTREMITIES:  Lower extremities without clubbing, cyanosis or edema.  NEUROLOGIC:  Neurologically intact.  Strength 5/5 in all 4 extremities.  Cranial nerves intact.   LABS:  White blood cell count 9.1, hemoglobin 14.7.  Sodium 133,  potassium 4.3, creatinine 2.31, AST 38.  Cardiac enzymes within normal  limits, except for myoglobin above 500.  Lipase within normal limits.  UA showing nitrite positive, white blood cell count 0-2, bacteria few.  There is a lot of blood in the urine and protein in the urine.  Culture  of the blood and urine are currently pending.  Chest x-ray with  hyperinflation in a abnormal small area, which may need a CT scan in the  future.  CT of the abdomen and pelvis showing moderate left  hydronephrosis with a 7 mm calculus obstructing; and  perinephric  inflammation and fatty liver infiltration.   EKG:  With questionable atrial fibrillation.  In the past he had first-  degree AV block.  Heart rate ranges between 60s to 80s.  There is very  poor baseline, making it hard to read.   ASSESSMENT AND PLAN:  1. Possibly early urosepsis with obstructing renal calculus, with      hydronephrosis on the left:  Appreciate Dr. Rosana Hoes and      interventional radiology help.  The patient is to go for      percutaneous nephrostomy done in the near future.  Thereafter will      admit for further evaluation in step-down.  Will give IV Rocephin,      IV fluids.  Will follow creatinine.  Will follow up on urine and      blood cultures.  2. Acute renal failure:  I think this is partially a combination of      obstruction versus possible dehydration from nausea and vomiting.      Will give IV fluids and follow.  Hope that there is going to be      improvement after obstruction has been taken care of.  3. Atrial fibrillation:  Will check a TSH.  Will check a 2-D      echocardiogram and monitor on cardiac monitor.  Will have a repeat      EKG to try and further assess what rhythm the patient is in; if      there is any evidence of second-degree block or third-degree block.      He may need a cardiology consult.  The patient otherwise will      probably need to be followed up with cardiology either way.  For      right now we will not anticoagulate, but once his acute illness      resolves, pending if atrial fibrillation will resolve itself or if      it persists, may need to be considered for anticoagulation.  4. Gout:  Continue allopurinol.  5. Fatty liver infiltration:  Will check a fasting lipid panel.  6. Obesity and slightly elevated blood sugar at 144:  Will check      hemoglobin A1c.   PROPHYLAXIS:  SCDs and Protonix.      Farris Has, MD  Electronically Signed     AVD/MEDQ  D:  09/14/2008  T:  09/14/2008  Job:   [id number]   cc:   Ashby Dawes. Polite, M.D.  [place] Rosana Hoes, M.D.

## 2011-04-01 NOTE — Discharge Summary (Signed)
[name], [name]                 ACCOUNT NO.:  [id number]   MEDICAL RECORD NO.:  [id number]          PATIENT TYPE:  INP   LOCATION:  2508                         FACILITY:  [facility]   PHYSICIAN:  Ludwig Lean. Doreatha Lew, M.D.DATE OF BIRTH:  12-29-1940   DATE OF ADMISSION:  12/11/2008  DATE OF DISCHARGE:  12/12/2008                               DISCHARGE SUMMARY   DISCHARGE DIAGNOSIS:  1. Atrial fibrillation with slow ventricular response that now status      post dual-chamber pacemaker implantation with Medtronic P1501DR      EnRhythm, serial [id number].  2. Morbid obesity.  3. Gout.  4. Osteoarthritis.  5. Recent urosepsis.   HISTORY OF PRESENT ILLNESS:  The patient is a 70 year old morbidly obese  male who was referred for pacemaker implantation.  Back in the latter  part of last year, he had had an episode of urosepsis.  During that  hospitalization, he was noted to have atrial fibrillation with a slow  ventricular response.  He has not had any syncope, chest pain,  lightheadedness, or dizziness but he does get short of breath easily.  He had a Holter monitor placed which showed profound bradycardia.  He  subsequently was referred for pacemaker implantation.   Please see the history and physical for further patient presentation and  profile.   LABORATORY DATA:  His EKG showed atrial fibrillation with a slow  ventricular response.  Chemistry showed sodium 144, potassium 4.8,  chloride 103, CO2 31, BUN was 22, creatinine 1.2, and glucose of 111.  His CBC was normal.  PT and PTT were unremarkable.  BNP level was only  74.   HOSPITAL COURSE:  The patient was admitted electively on December 11, 2008.  He underwent implantation with a dual-chamber Medtronic unit  P1501DR EnRhythm, serial [id number].  That procedure was tolerated  well.  Afterwards he was transferred to 2500 and today on December 12, 2008, he is doing well without complaints and is felt to be a stable  candidate  for discharge.  It will be our plan to start Coumadin later on  this week with the ultimate goal being cardioversion in approximately 4-  6 weeks to restore sinus rhythm.   CONDITION ON DISCHARGE:  Stable.   DISCHARGE DIET:  Low-salt, heart-healthy.   DISCHARGE INSTRUCTIONS:  Extensive written instructions are given  regarding pacemaker care, specifically not to raise the right arm above  his head for the next 2-3 weeks.  He is also to paint the wound 2 times  a day with Betadine swabs and he is to avoid getting the wound wet for  the first week as well.   DISCHARGE MEDICATIONS:  1. Allopurinol 300 mg a day.  2. Coumadin 5 mg each evening, start taking this Thursday.   FOLLOWUP:  We will plan on seeing him back in the office on Monday,  December 18, 2008 for a first Protime as well as office visit.  He is  asked to call to schedule the actual time and certainly sooner if any  problems arise in the interim.  Doyle Askew, N.P.      Ludwig Lean. Doreatha Lew, M.D.  Electronically Signed    LC/MEDQ  D:  12/12/2008  T:  12/12/2008  Job:  [id number]

## 2011-04-01 NOTE — Cardiovascular Report (Signed)
[name], BENSINGER                 ACCOUNT NO.:  [id number]   MEDICAL RECORD NO.:  [id number]          PATIENT TYPE:  INP   LOCATION:  2508                         FACILITY:  [facility]   PHYSICIAN:  Ludwig Lean. Doreatha Lew, M.D.DATE OF BIRTH:  May 06, 1941   DATE OF PROCEDURE:  12/11/2008  DATE OF DISCHARGE:                            CARDIAC CATHETERIZATION   PROCEDURE:  Implantation of a dual-chamber pulse generating system under  fluoroscopy.   INDICATIONS FOR PROCEDURE:  Atrial fibrillation with bradycardia with  plans for possible cardioversion.   PROCEDURE:  The right subclavicular area was prepped and draped.  The  area was infiltrated with 1% Xylocaine.  Using sharp and Bovie  dissection, a pocket was created to the prepectoral fascia.  Hemostasis  was obtained.  Hemostasis was difficult but satisfactory.  Two punctures  were made into the subclavian vein over top first rib.  Using a 7-French  Cook introducers, atrial and ventricular leads were introduced.  The  ventricular lead is a Guidant bipolar screw-in lead model 44715, line 2,  serial number [id number].  R waves measured 6.5 mV.  Ventricular lead  impedance was 930 ohms.  Ventricular capture threshold is 0.8 volts with  a current of 1.3 mA of 0.5 milliseconds pulse width.  A 10-volt pacing  was negative for stimulation.  The lead was placed at the RV apex.  Attempts initially were made to place it on the septal wall, but not  felt to be satisfactory.   The atrial lead was a Guidant bipolar screw-in lead model D2011204,  serial number [id number].  The following atrial thresholds were not obtained  due to atrial fibrillation.  The atrial fibrillation was 0.5 mV in  height.   The leads were sutured in place.  The wound was flushed with gentamicin  solution.  Hemostasis was obtained.  The leads were connected to a  Medtronic EN RHYTHM, serial number P1501DR, serial number [id number].  The leads were connected to the pulse generator.  The  pulse generator  was sutured in place.  The wound was again flushed with gentamicin  solution.  The wound was subsequently closed with 2-0 and 4-0 Vicryl and  Steri-Strips were applied.  The patient tolerated the procedure well.  He received Valium preoperatively and Versed and fentanyl during the  procedure.      Ludwig Lean. Doreatha Lew, M.D.  Electronically Signed     SNT/MEDQ  D:  12/11/2008  T:  12/12/2008  Job:  [id number]

## 2011-04-01 NOTE — H&P (Signed)
[name], [name]                 ACCOUNT NO.:  [id number]   MEDICAL RECORD NO.:  [id number]          PATIENT TYPE:  OIB   LOCATION:                               FACILITY:  [facility]   PHYSICIAN:  Ludwig Lean. Doreatha Lew, M.D.DATE OF BIRTH:  February 10, 1941   DATE OF ADMISSION:  12/11/2008  DATE OF DISCHARGE:                              HISTORY & PHYSICAL   CHIEF COMPLAINT:  Shortness of breath.   HISTORY OF PRESENT ILLNESS:  The patient is a 70 year old morbidly obese  male who is referred for pacemaker implantation.  He had a history of  urosepsis in October of last year, and at that time was noted to be in  atrial fibrillation.  He was referred to our office for bradycardia.  He  has had a Holter monitor which has documented profound bradycardia with  rates down in the 30s and as high as 110.  He is felt to be in chronic  atrial fibrillation and he is now referred for pacemaker implantation.   PAST MEDICAL HISTORY:  1. Recent renal stone with urinary obstruction and urosepsis in      October 2009.  2. Morbid obesity.  3. Hypertension.  4. Chronic fatigue.  5. History of headaches.  6. History of cataracts.  7. History of depression.  8. History of gout.  9. Generalized osteoarthritis.   SURGICAL HISTORY:  1. Left knee replacement in 2001.  2. Prostate cancer with seed implantation in 2004.  3. Kidney stone in October 2009.  4. Right shoulder surgery approximately 2 years ago.   ALLERGIES:  OXYCODONE.   CURRENT MEDICATIONS:  1. Allopurinol 300 mg a day.  2. Celebrex 400 mg a day.   FAMILY HISTORY:  Father died of a heart attack at age 2.  Mother is  living at the age of 76.  He has 1 brother who is 52 years of age.   SOCIAL HISTORY:  He is a retired Administrator.  He lives alone.  He has  2 sons, one of which lives with him.  His wife died of cancer here in  her mid 29s.  He does not have any alcohol or tobacco use.   REVIEW OF SYSTEMS:  He sleeps in the recliner primarily  because of  shortness of breath.  He does feel refreshed with his sleep.  He does  snore.  He has never had a sleep study performed.  He has been morbidly  obese for several years.  He has had headaches most recently.  He has  had no frank syncope.  No history of strokes.  He notes that his main  weakness is that of excessive snacking as well as soft drinks.  He has  had no abdominal pain.  He has had no blood in his stool.  He has had no  recent fever or chills, and all other review of systems are negative.   PHYSICAL EXAMINATION:  GENERAL:  He is a morbidly obese white male.  NAD. His weight is over 360 pounds.  He was not able to be weighed in  our office.  HEENT:  Normocephalic and atraumatic.  Pupils were equal.  Conjunctivae  were clear.  Oropharynx showed decreased airway with prominent tongue.  NECK:  Supple.  No masses.  No lymphadenopathy.  No JVD.  SKIN:  Warm and dry.  Color is unremarkable.  LUNGS:  Clear.  HEART:  An irregular rhythm.  Heart tones are distant.  There was no  murmur.  ABDOMEN:  Obese and soft with positive bowel sounds.  EXTREMITIES:  Trace amount of lower extremity edema bilateral with  adequate pulses intact.  NEUROLOGIC:  No gross focal deficits.   PERTINENT LABORATORY STUDIES:  His EKG showed atrial fibrillation with  slow ventricular response.  His Holter monitor showed tachybrady  syndrome.   OVERALL IMPRESSION:  1. Atrial fibrillation with slow ventricular response.  2. Morbid obesity.  3. Recent urosepsis.  4. History of prostate cancer.  5. History of depression.  6. Gout.  7. Generalized arthritis.   PLAN:  We will proceed on with pacemaker implantation.  We will attempt  dual-chamber pacemaker implant.  The patient's echo has been reviewed.  He really only has mild left atrial enlargement and it is felt that we  may be able to place him on Coumadin and then proceed on with  cardioversion in the future.  Procedure has been reviewed in  full detail  and he is willing to proceed on Monday, December 11, 2008.      Doyle Askew, N.P.      Ludwig Lean. Doreatha Lew, M.D.  Electronically Signed    LC/MEDQ  D:  12/06/2008  T:  12/07/2008  Job:  [id number]

## 2011-04-01 NOTE — Cardiovascular Report (Signed)
NAMEEARLEY, POCK                 ACCOUNT NO.:  [id number]   MEDICAL RECORD NO.:  [id number]          PATIENT TYPE:  OIB   LOCATION:  2899                         FACILITY:  [facility]   PHYSICIAN:  Ludwig Lean. Doreatha Lew, M.D.DATE OF BIRTH:  1941-08-13   DATE OF PROCEDURE:  DATE OF DISCHARGE:  02/19/2009                            CARDIAC CATHETERIZATION   PROCEDURE:  Cardioversion.   Anesthesia was given by Dr. Annye Asa, 375 mg IV pentothal.  Cardioversion was attempted with anterior and posterior pads.  A 201  seconds of biphasic energy were delivered on 2 different occasions with  no cardioversions.  The patient was felt to have unsuccessful  cardioversion.  He was reprogrammed to DDI at a pacing rate and left in  atrial fibrillation.  He will need to be managed with rate control and  chronic anticoagulation.      Ludwig Lean. Doreatha Lew, M.D.  Electronically Signed     SNT/MEDQ  D:  02/19/2009  T:  02/20/2009  Job:  [id number]

## 2011-04-01 NOTE — Op Note (Signed)
NAMETYLER, BLECH                 ACCOUNT NO.:  [id number]   MEDICAL RECORD NO.:  [id number]          PATIENT TYPE:  AMB   LOCATION:  DAY                          [facility]ACILITY[facility]   PHYSICIAN:  Tarri Glenn, M.D.  DATE OF BIRTH:  [birth year]   DATE OF PROCEDURE:  10/20/2007  DATE OF DISCHARGE:                               OPERATIVE REPORT   PREOPERATIVE DIAGNOSES:  1. Degenerative arthritis, right shoulder joint, with labral      disruption and partial rotator cuff tear versus complete rotator      cuff tear.  2. Degenerative arthritis, acromioclavicular joint.   OPERATION:  1. Right shoulder arthroscopy with debridement of joint, labrum and      undersurface of rotator cuff.  2. Arthroscopic subacromial decompression.  3. Open distal clavicle resection.   SURGEON:  Tarri Glenn, M.D.   ASSISTANTElodia Florence. Vanita Ingles.   ANESTHESIA:  General.   PATHOLOGY AND JUSTIFICATION FOR PROCEDURE:  He had a very painful right  shoulder with an MRI on October 28 demonstrating the above preoperative  diagnoses.  The question was whether not there was a substantial full-  thickness tear or not and whether or not open rotator cuff repair would  be required.  See operative description below.   PROCEDURE:  Prophylactic antibiotics, satisfactory general anesthesia,  beach-chair position on the Allen frame, right shoulder girdle was  prepped with DuraPrep, draped in a sterile field, time-out performed.  Anatomy of the shoulder joint was marked out and lateral portal and  posterior portal and subacromial space and incision for the distal  clavicle resection were all infiltrated with 0.5% Marcaine with  adrenalin.  Through a posterior soft spot portal I atraumatically  entered the glenohumeral joint.  It was somewhat tight because of his  size and degenerative arthritis.  He had marked synovitis present with  only a remnant of the biceps tendon and no complete labral tear.  There  was a good bit of fraying of the undersurface of the rotator cuff.  Accordingly, I advanced the scope anteriorly and using switching stick  made an anterior incision, over which I placed a metal cannula and  placed a 4.2 shaver into the joint.  I then began debriding out the  synovium in the joint, debrided down the labrum and the underneath  surface of the rotator cuff with representative pictures being taken.  I  then redirected the scope in the subacromial space.  He had good bit of  bursitis.  There was some roughening of the rotator cuff but I could not  identify any full-thickness rotator cuff tear or real discontinuity in  the tendon.  Accordingly, I proceeded with the subacromial  decompression.  Using the 90-degree ArthroCare vaporizer, I trimmed the  soft tissue from the undersurface of the distal acromion back to the [facility]  joint and then following this with a 4-mm bur and then I went back and  forth between the bur, the vaporizer and the shaver until we had a wide  decompression and the rotator cuff was smooth.  I then  removed all fluid  possible from the subacromial space and made an open incision over the  distal clavicle, identifying a very fibrotic AC joint and measuring back  1.5 cm medial to it and then undermined the clavicle at this point.  I  amputated the clavicle with a micro saw and removed the fragments with  towel clip and cautery technique.  There were no remnants of bone  remaining at the conclusion of this and the underneath surface of the  acromion at the [facility] joint appeared to be free of any bony spurs.  I  placed bone wax over the distal cut surface of the clavicle, irrigated  the wound well, and then placed Gelfoam in the resection interval.  The  wound was then closed with interrupted #1 Vicryl in the fascial and  subperiosteal complex and in the deep subcutaneous tissue, 2-0 Vicryl in  the superficial subcutaneous tissue and Steri-Strips on the skin with 4-  0  nylon for the three portals, all of which were re-infiltrated with  0.5% Marcaine with adrenaline.  Dry sterile dressing, shoulder  immobilizer applied.  He tolerated the procedure well and was taken to  the recovery room in satisfactory condition with no known complications.           ______________________________  Tarri Glenn, M.D.     JA/MEDQ  D:  10/20/2007  T:  10/20/2007  Job:  [id number]

## 2011-04-01 NOTE — H&P (Signed)
NAMECAMERIN, [name]                 ACCOUNT NO.:  [id number]   MEDICAL RECORD NO.:  [id number]          PATIENT TYPE:  OIB   LOCATION:                               FACILITY:  [facility]   PHYSICIAN:  Ludwig Lean. Doreatha Lew, M.D.DATE OF BIRTH:  04/16/1941   DATE OF ADMISSION:  02/19/2009  DATE OF DISCHARGE:                              HISTORY & PHYSICAL   CHIEF COMPLAINT:  None.   HISTORY OF PRESENT ILLNESS:  [name] is a very pleasant 70 year old  white male, who presents for attempts at elective cardioversion.  He has  had atrial fibrillation and had pacemaker implantation earlier this  year.  He has now been maintained on Coumadin.  His INRs have been  therapeutic over the last month and he presents for attempts at  cardioversion.  Clinically, he had had some shortness of breath as well  as fatigue.  Most of that was felt to be due to profound bradycardia,  which is now basically resolved since he has had a pacemaker in place.  He really has no awareness of the atrial fibrillation.  He has not been  lightheaded, dizzy, and has had no syncope.   PAST MEDICAL HISTORY:  1. Profound bradycardia, status post dual-chamber pacemaker      implantation on December 11, 2008.  He currently has a Medtronic      EnRhythm unit in place.  2. History of renal stone with urinary obstruction and subsequent      urosepsis in October 2009.  3. Chronic atrial fib.  4. Morbid obesity.  5. Hypertension.  6. Chronic fatigue.  7. History of headaches.  8. History of cataracts.  9. Depression.  10.History of gout.  11.Generalized osteoarthritis.   SURGICAL HISTORY:  1. Left knee replacement in 2001.  2. Prostate cancer with seed implantation in 2004.  3. Kidney stone in October 2009.  4. Right shoulder surgery approximately 2 years ago.   ALLERGIES:  OXYCODONE.   CURRENT MEDICATIONS:  1. Coumadin currently taking 10 mg 2 days a week, 7.5 x5.  2. Allopurinol 300 mg a day.  3. Tylenol Arthritis  daily.   FAMILY HISTORY:  Father died of a heart attack at age 78 and mother  lived until she was 14.  He has 1 brother who is 90.   SOCIAL HISTORY:  He is a retired Administrator.  He lives alone.  His  wife died of cancer in her mid 29s.  He has no alcohol or tobacco use.   REVIEW OF SYSTEMS:  His shortness of breath has improved.  He does  continue to sleep in the recliner, which has been chronic.  He does  snore.  He has never had a sleep study performed.  He has been morbidly  obese for several years, but has been trying to lose weight after  stopping soda as well as excessive snacking.  He has had no chest pain.  He has had no abdominal pain.  No constipation or diarrhea.  He has  tolerated his Coumadin very well.  He has no complaints of palpitations  and really has no awareness of the atrial fibrillation.  All other  review of systems are negative.   PHYSICAL EXAMINATION:  GENERAL:  He is a very pleasant, morbidly obese  white male, who is in no acute distress.  He is pleasant and conversive.  Responds appropriately.  VITAL SIGNS:  Blood pressure of 136/80 sitting, 130/80 standing, heart  rate is 60, and his weight is 356.6 pounds.  SKIN:  Warm and dry.  Color is unremarkable.  HEENT:  Normocephalic and atraumatic.  Pupils are equal and reactive.  Conjunctivae is normal.  NECK:  Supple with no masses.  No bruits. No JVD.  LUNGS:  Clear to auscultate. Not short of breath at rest.  CARDIAC:  Irregular rhythm.  Pacemaker is in the right upper chest and  is well healed.  ABDOMEN:  Morbidly obese, yet soft positive bowel sounds and nontender.  EXTREMITIES:  Full, but really no significant edema.  MUSCULOSKELETAL:  Strength is normal. Gait and range of motion are  intact.  NEUROLOGIC:  No gross focal deficits.   PERTINENT LABS:  Pending.   OVERALL IMPRESSION:  1. Atrial fibrillation.  2. Recent pacemaker implantation.  3. Morbid obesity.  4. Previous urosepsis.  5. History  of prostate cancer.  6. History of depression.  7. Gout.  8. Generalized arthritis.   PLAN:  We will proceed on with attempts at cardioversion.  The risks of  procedure and benefits have all been explained and he is willing to  proceed on Monday, February 19, 2009.      Doyle Askew, N.P.      Ludwig Lean. Doreatha Lew, M.D.  Electronically Signed    LC/MEDQ  D:  02/07/2009  T:  02/07/2009  Job:  [id number]

## 2011-04-02 ENCOUNTER — Ambulatory Visit (INDEPENDENT_AMBULATORY_CARE_PROVIDER_SITE_OTHER): Payer: Medicare Other | Admitting: *Deleted

## 2011-04-02 ENCOUNTER — Other Ambulatory Visit: Payer: Self-pay | Admitting: Internal Medicine

## 2011-04-02 DIAGNOSIS — Z950 Presence of cardiac pacemaker: Secondary | ICD-10-CM

## 2011-04-02 DIAGNOSIS — I4891 Unspecified atrial fibrillation: Secondary | ICD-10-CM

## 2011-04-02 DIAGNOSIS — Z7901 Long term (current) use of anticoagulants: Secondary | ICD-10-CM

## 2011-04-02 DIAGNOSIS — I495 Sick sinus syndrome: Secondary | ICD-10-CM

## 2011-04-04 NOTE — Op Note (Signed)
[facility]  Patient:    [name], [name]                        MRN: [id number] Proc. Date: 06/22/00 Adm. Date:  [id number] Attending:  Tarri Glenn Page                           Operative Report  PREOPERATIVE DIAGNOSIS:  Advanced osteoarthritis of left knee.  POSTOPERATIVE DIAGNOSIS:  Advanced osteoarthritis of left knee.  OPERATION:  Osteonics total knee replacement, left.  SURGEON:  Laurice Record. Aplington, M.D.  ASSISTANT:  Philips J. Eulas Post, M.D. and Sarina Ill L. Clabe Seal, P.A.  ANESTHESIA:  General.  PATHOLOGY AND INDICATIONS FOR PROCEDURE:  He had bone-on-bone abutment of the medial compartment of the knee joint, severe patellofemoral arthritis, and about a 10-15 degree flexion contracture.  DESCRIPTION OF PROCEDURE:  Prophylactic antibiotics, satisfactory general anesthesia after spinal was attempted and was unsuccessful, pneumatic tourniquet, Stulberg foot holder, and leg balance over the lateral hip.  The left leg was prepped from tourniquet to ankle with DuraPrep, and draped in the sterile field.  A vertical midline incision down to the patellar mechanism with median parapatellar incision to open the joint.  The pes anserinus and medial collateral ligament were undermined off the tibia.  Large osteophytes around the femur and the patella were excised, and a good bit of reactive synovium and soft tissue was removed from the superior femur just proximal to the femoral condyles.  The patella was then everted and the knee flexed, and further removed osteophytes from around the femur was completed.  The anterior portions of both menisci were detached and the ACL excised.  I then made a 5/16 inch drill hole in the distal femur and the intercondylar notch followed by a canal finder and the distal femoral cutting jig, cut at 5 degrees for the left knee.  Because of flexion contracture, I elected to take 12 mm off the distal femur, and after  setting the jig for 10 mm cut, we then advanced it for another 2 mm cut, and the distal femoral cut was made.  X-ray templating had indicated probably a size 13 to the distal femur and we then used the distal femoral sizing jig to confirm this.  We also placed to describe lines for application of the femoral cutting jig for anterior and posterior.  The #13 cutting jig was then applied, and anterior and posterior cuts, and posterior and anterior chamferings were made.  We then went to the tibia where the remnants of menisci were removed and the leveling cut was made on the proximal tibia which was sized, initially tentatively of a #13, but we subsequently down sized this to an 11.  The 13 tibial template was placed and the intermedullary drill hole made, enlarged with the step cut drill followed by a canal finder.  To this, I attached the external cutting jig, lining it up with an external rod, splitting imalleolar distance.  I elected to start with a 6 mm cut off the medial tibia while making the first cut.  It was clear that this was insufficient, and I advanced this for another 4 mm cut.  I then used the laminar spreader to remove remnants of bone posteriorly on the femur, and both compartments.  I then placed a femoral cutting jig for the patella and the patellar groove was made. I elected to use the  posterior cruciate retaining Scorpio model.  We then went through a trial reduction with femoral and tibial tray, and initially a 10 mm spacer which was a little loose, and I felt that we would use a 12 mm tray.  He was a little tight medially and further medial release medially and posteriorly on the tibia was made which balanced out the ligament structures.  We then went to measure for the tibial tray once again, and the 11 was the best fit, and using external alining rods, splitting the bimalleolar disance as described.  The lines were placed on the anterior tibia.  While  the components were in and the knee was in extension, we then sized him for a patella.  He had a good bit of medial and lateral size, but superiorly and inferiorly, the 28 was the best choice.  The 10 mm recessed cutting jig was placed and a 10 mm recessed cut made.  We then placed the holder for drilling through fixation screw and then we placed the hole for drilling three fixation pegs and the three holes were made.  I then returned to the tibia and using the 11 tibial tray and the scrab lines as depicted, and attached the 11 tibial tray to the proximal tibia and then used the tripod apparatus, working up to an 11 cemented.  Upon the creation of the groove of the tibial keel, we then water picked the knee while the methyl methacrylate was being mixed.  We then applied the methyl methacrylate first to the tibia, tightly impacting the tibial tray, and removing excess methyl methacrylate.  We then placed methyl methacrylate on the femur, and we impacted the femoral component tightly, and again, removed excess methyl methacrylate.  Finally, methyl methacrylate within the patella, holding with a patellar clamp, and removing excess methyl methacrylate.  Gelfoam was packed in the distal femoral hole which we had used initially for alignment, and with the knee in extension, we waited until the methyl methacrylate had hardened. Small bits of methyl methacrylate were then removed from around the components and placed a 12 mm trial spacer, and found that this seemed to be the ideal fit with excellent medial and lateral stability, extension to neutral, and flexion pass 90 degrees.  The patellar mechanism was tight and complete lateral release was performed, and the patella then stayed in the midline with flexion and extension of the knee.  Following placement of final tibial 12 mm spacer, the Hemovac was inserted and the wound closed.  The synovium and capsule in two layers distally  with interrupted #1 Vicryl, and proximally the quadriceps in two layers with interrupted #1 Vicryl.  The subcutaneous tissue was closed with a combination of #1 and 2-0 Vicryl, and the skin with staples.  Betadine Adaptic dry sterile  dressings were applied and at this time, there was 2 hours of tourniquet time, and the tourniquet was released.  The patient tolerated the procedure well and was taken to the recovery room in satisfactory condition with no known complications.  Estimated blood loss was basically zero.  Mr. Delorse Lek assisted just to the final stages of the closure and dressings. DD:  06/22/00 TD:  06/23/00 Job: [id number]

## 2011-04-04 NOTE — Discharge Summary (Signed)
[facility]  Patient:    [name], [name]                        MRN: [id number] Adm. Date:  [id number] Disc. Date: [id number] Attending:  Tarri Glenn Page Dictator:   Elodia Florence. Clabe Seal, P.A. CC:         Sandy Salaam. Chevis Pretty., M.D.                           Discharge Summary  ADMITTING DIAGNOSES 1. 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 2. Gout. 3. Asthma. 4. 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 5. Obesity.  DISCHARGE DIAGNOSES 1. 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 2. Gout. 3. Asthma. 4. 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 5. Obesity. 6. Mild postoperative anemia.  OPERATIONS:  On June 22, 2000, the patient underwent Osteonics total knee replacement arthroplasty to the left knee, Dr. Purnell Shoemaker. Eulas Post and [place] Clabe Seal, P.A.C. assisted.  CONSULTANTS:  None.  BRIEF HISTORY:  This is a 70 year old white male who has had progressive problems concerning his left knee.  He essentially now is at a point where he just does not function.  He has had knee arthroscopy to the knee about eight years ago which gave him some relief but presently, he has extreme pain into the left knee.  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  He has developed a flexion contracture of 10 to 15 degrees of the left knee and a varus deformity has also developed.  X-rays have shown bone-on-bone deformity and marked arthritic changes.  After much discussion and the fact that patient has not responded to conservative care including nonsteroidal anti-inflammatories, it was felt that he would benefit with surgical intervention and is being admitted for the above procedure.  COURSE IN THE HOSPITAL:  The patient tolerated the surgical procedure quite well.  He was entered into the total knee protocol and progressed very nicely. His wound  remained essentially dry postoperatively and neurovascularly, he remained intact to the lower extremity.  We allowed partial weightbearing with physical therapy and he did this quite well.  He did not develop any vascular compromise postoperatively and negative Homans was noted on daily examination. The patient eventually felt that he could be maintained at home with home physical therapy and arrangements were made for discharge.  His vital signs were stable at discharge.  LABORATORY AND X-RAY FINDINGS:  Laboratory values in the hospital hematologically showed a CBC on admission which was completely within normal limits.  Final hemoglobin was 10.7.  Hematocrit was 30.5.  Blood chemistries also were normal, other than a very slightly elevated BUN at 25.  Urinalysis was negative for urinary tract infection.  Electrocardiogram showed normal sinus rhythm with first degree A-V block.  The chest x-ray showed mild interstitial prominence, "this could represent chronic interstitial disease."  CONDITION ON DISCHARGE:  Improved and stable.  PLAN:  The patient is discharged to his home in the care of his family.  WOUND CARE:  He is to keep the wound dry.  Use dry dressing p.r.n.  ACTIVITIES:  Continue with home physical therapy.  FOLLOWUP:  He is to return to the clinic in about two weeks after the date of surgery.  Follow up with Dr. Sandy Salaam. Chevis Pretty. as needed or  as instructed.  DISCHARGE MEDICATIONS 1. Coumadin per pharmacy at [facility]. 2. Robaxin 500 mg, #40, one q.6h. p.r.n. muscle spasm. 3. Percocet 5 mg, #50, one to two q.4-6h. p.r.n. pain. 4. He is to continue with his home medications.  DIET:  He is to continue with his home diet.  SPECIAL INSTRUCTIONS:  Call if any problems. DD:  07/04/00 TD:  07/07/00 Job: [id number]

## 2011-04-04 NOTE — Op Note (Signed)
[facility]  Patient:    [name], [name] Visit Number: [id number] MRN: [id number]          Service Type: DSU Location: DAY Attending Physician:  Lux Dopp Proc. Date: 07/27/01 Admit Date:  07/27/2001   CC:         Sandy Salaam. Chevis Pretty., M.D.  Truddie Crumble, M.D.   Operative Report  PREOPERATIVE DIAGNOSIS:  Adenocarcinoma of the prostate, clinical state T1CNXMX.  POSTOPERATIVE DIAGNOSIS:  Adenocarcinoma of the prostate, clinical state T1CNXMX.  PRINCIPAL PROCEDURE:  Placement of I-125 seeds in prostate, cystoscopy.  SURGEON:  Lillette Boxer. Diona Fanti, M.D.  RADIATION ONCOLOGIST:  Truddie Crumble, M.D.  ANESTHESIA:  General endotracheal.  COMPLICATIONS:  None.  BRIEF HISTORY:  A 70 year old male with biopsy-proven adenocarcinoma of the prostate.  He was found to have Gleason grade 3+3, involving 20% of one core on the right side.  The left-sided prostatic biopsies were negative.  He had an elevated PSA, leading to his ultrasound biopsy.  He has chosen brachial therapy, having been instructed in the risks and complications of this as well as alternatives which include surgery and watchful waiting.  DESCRIPTION OF PROCEDURE:  The patient was administered a general endotracheal anesthetic after postoperative IV antibiotics had been administered in the holding area.  He was placed in the dorsal lithotomy position.  Genitalia, perineum, and lower abdomen were prepped and draped.  Foley catheter was placed transurethrally and then hooked to dependent drainage.  The transrectal ultrasound probe was placed, and images were taken scanning through the prostate.  The reference plane for seed placement was 2 cm from the base. Once adequate positioning of ultrasound probe was performed, fluoroscopic and ultrasonographic guidance were used to place 114 seeds using 24 needles. Adequate positioning was seen with all of these.   Flexible cystourethroscopy after the procedure revealed no evidence of loose seeds within the urethra or bladder.  At this point, a 55 French Foley catheter was replaced and hooked to dependent drainage.  The patient was awakened, extubated, and taken to the PACU in stable condition. Attending Physician:  Berkovich Dopp DD:  07/27/01 TD:  07/27/01 Job: [id number]

## 2011-04-07 ENCOUNTER — Encounter: Payer: Self-pay | Admitting: *Deleted

## 2011-04-09 NOTE — Progress Notes (Signed)
Pacer remote check  

## 2011-04-29 ENCOUNTER — Ambulatory Visit (INDEPENDENT_AMBULATORY_CARE_PROVIDER_SITE_OTHER): Payer: Medicare Other | Admitting: *Deleted

## 2011-04-29 DIAGNOSIS — I4891 Unspecified atrial fibrillation: Secondary | ICD-10-CM

## 2011-04-29 DIAGNOSIS — Z7901 Long term (current) use of anticoagulants: Secondary | ICD-10-CM

## 2011-04-29 LAB — POCT INR: INR: 2.6

## 2011-05-27 ENCOUNTER — Ambulatory Visit (INDEPENDENT_AMBULATORY_CARE_PROVIDER_SITE_OTHER): Payer: Medicare Other | Admitting: *Deleted

## 2011-05-27 DIAGNOSIS — Z7901 Long term (current) use of anticoagulants: Secondary | ICD-10-CM

## 2011-05-27 DIAGNOSIS — I4891 Unspecified atrial fibrillation: Secondary | ICD-10-CM

## 2011-05-27 LAB — POCT INR: INR: 2.4

## 2011-06-24 ENCOUNTER — Encounter: Payer: Medicare Other | Admitting: *Deleted

## 2011-07-01 ENCOUNTER — Encounter: Payer: Medicare Other | Admitting: *Deleted

## 2011-07-03 ENCOUNTER — Encounter: Payer: MEDICARE | Admitting: *Deleted

## 2011-07-07 ENCOUNTER — Encounter: Payer: Self-pay | Admitting: *Deleted

## 2011-07-11 ENCOUNTER — Ambulatory Visit (INDEPENDENT_AMBULATORY_CARE_PROVIDER_SITE_OTHER): Payer: Medicare Other | Admitting: *Deleted

## 2011-07-11 ENCOUNTER — Other Ambulatory Visit: Payer: Self-pay

## 2011-07-11 ENCOUNTER — Encounter: Payer: Self-pay | Admitting: Internal Medicine

## 2011-07-11 DIAGNOSIS — I495 Sick sinus syndrome: Secondary | ICD-10-CM

## 2011-07-14 ENCOUNTER — Ambulatory Visit (INDEPENDENT_AMBULATORY_CARE_PROVIDER_SITE_OTHER): Payer: Medicare Other | Admitting: *Deleted

## 2011-07-14 DIAGNOSIS — I4891 Unspecified atrial fibrillation: Secondary | ICD-10-CM

## 2011-07-14 DIAGNOSIS — Z7901 Long term (current) use of anticoagulants: Secondary | ICD-10-CM

## 2011-07-14 LAB — POCT INR: INR: 2.3

## 2011-07-14 LAB — REMOTE PACEMAKER DEVICE
AL IMPEDENCE PM: 416 Ohm
BMOD-0004RV: 5
BRDY-0002RV: 70 {beats}/min
RV LEAD IMPEDENCE PM: 536 Ohm

## 2011-07-22 ENCOUNTER — Encounter: Payer: Self-pay | Admitting: *Deleted

## 2011-07-29 NOTE — Progress Notes (Signed)
pacer checked by remote

## 2011-08-04 ENCOUNTER — Other Ambulatory Visit: Payer: Self-pay | Admitting: Cardiology

## 2011-08-04 NOTE — Telephone Encounter (Signed)
Refilled Meds from fax

## 2011-08-15 ENCOUNTER — Ambulatory Visit (INDEPENDENT_AMBULATORY_CARE_PROVIDER_SITE_OTHER): Payer: Medicare Other | Admitting: *Deleted

## 2011-08-15 DIAGNOSIS — I4891 Unspecified atrial fibrillation: Secondary | ICD-10-CM

## 2011-08-15 DIAGNOSIS — Z7901 Long term (current) use of anticoagulants: Secondary | ICD-10-CM

## 2011-08-15 LAB — POCT INR: INR: 2.2

## 2011-08-18 ENCOUNTER — Encounter: Payer: Medicare Other | Admitting: *Deleted

## 2011-08-19 LAB — URINALYSIS, ROUTINE W REFLEX MICROSCOPIC
Glucose, UA: NEGATIVE
Ketones, ur: 15 — AB
Nitrite: POSITIVE — AB
Protein, ur: >300 — AB
Specific Gravity, Urine: 1.029
Urobilinogen, UA: 4 — ABNORMAL HIGH
pH: 5.5

## 2011-08-19 LAB — CARDIAC PANEL(CRET KIN+CKTOT+MB+TROPI)
CK, MB: 1.7
CK, MB: 2.2
Relative Index: 0.4
Total CK: 302 — ABNORMAL HIGH
Total CK: 444 — ABNORMAL HIGH
Total CK: 497 — ABNORMAL HIGH
Troponin I: 0.02
Troponin I: 0.02
Troponin I: 0.03

## 2011-08-19 LAB — CBC
HCT: 41.6
HCT: 44.7
Hemoglobin: 13.8
MCHC: 33.1
Platelets: 151
Platelets: 186
Platelets: 208
RBC: 4.08 — ABNORMAL LOW
RDW: 13.8
RDW: 14
WBC: 9.1
WBC: 8.2

## 2011-08-19 LAB — GLUCOSE, CAPILLARY
Glucose-Capillary: 107 — ABNORMAL HIGH
Glucose-Capillary: 122 — ABNORMAL HIGH
Glucose-Capillary: 125 — ABNORMAL HIGH
Glucose-Capillary: 132 — ABNORMAL HIGH
Glucose-Capillary: 136 — ABNORMAL HIGH
Glucose-Capillary: 187 — ABNORMAL HIGH

## 2011-08-19 LAB — COMPREHENSIVE METABOLIC PANEL
AST: 38 — ABNORMAL HIGH
Albumin: 2.9 — ABNORMAL LOW
Albumin: 3.2 — ABNORMAL LOW
Alkaline Phosphatase: 77
BUN: 20
BUN: 24 — ABNORMAL HIGH
Calcium: 8.4
Chloride: 96
Creatinine, Ser: 2.31 — ABNORMAL HIGH
Creatinine, Ser: 2.36 — ABNORMAL HIGH
GFR calc Af Amer: 34 — ABNORMAL LOW
Glucose, Bld: 129 — ABNORMAL HIGH
Potassium: 3.9
Potassium: 4.3
Total Bilirubin: 1
Total Protein: 6.5
Total Protein: 7.5

## 2011-08-19 LAB — APTT
aPTT: 33
aPTT: 40 — ABNORMAL HIGH

## 2011-08-19 LAB — DIFFERENTIAL
Eosinophils Relative: 0
Lymphocytes Relative: 4 — ABNORMAL LOW
Lymphocytes Relative: 15
Lymphs Abs: 1.2
Monocytes Absolute: 0.5
Monocytes Relative: 6
Neutro Abs: 8.1 — ABNORMAL HIGH
Neutro Abs: 5.9
Neutrophils Relative %: 72

## 2011-08-19 LAB — CULTURE, BLOOD (ROUTINE X 2)

## 2011-08-19 LAB — D-DIMER, QUANTITATIVE: D-Dimer, Quant: 5.2 — ABNORMAL HIGH

## 2011-08-19 LAB — URINE CULTURE
Colony Count: >=100000
Colony Count: >=100000

## 2011-08-19 LAB — URINE MICROSCOPIC-ADD ON

## 2011-08-19 LAB — POCT CARDIAC MARKERS
CKMB, poc: 1.5
Myoglobin, poc: >500
Troponin i, poc: <0.05

## 2011-08-19 LAB — TSH: TSH: 1.487

## 2011-08-19 LAB — BASIC METABOLIC PANEL
BUN: 21
BUN: 32 — ABNORMAL HIGH
Calcium: 8.6
Calcium: 8.6
Creatinine, Ser: 1.67 — ABNORMAL HIGH
GFR calc Af Amer: 50 — ABNORMAL LOW
GFR calc Af Amer: 55 — ABNORMAL LOW
GFR calc non Af Amer: 29 — ABNORMAL LOW
GFR calc non Af Amer: 41 — ABNORMAL LOW
GFR calc non Af Amer: 46 — ABNORMAL LOW
Glucose, Bld: 126 — ABNORMAL HIGH
Glucose, Bld: 142 — ABNORMAL HIGH
Sodium: 137
Sodium: 142

## 2011-08-19 LAB — LIPID PANEL
Cholesterol: 126
LDL Cholesterol: 79
Triglycerides: 181 — ABNORMAL HIGH
VLDL: 36

## 2011-08-19 LAB — LACTIC ACID, PLASMA: Lactic Acid, Venous: 1.6

## 2011-08-19 LAB — HEMOGLOBIN A1C
Hgb A1c MFr Bld: 6.3 — ABNORMAL HIGH
Mean Plasma Glucose: 134

## 2011-08-19 LAB — PROTIME-INR
INR: 1.1
Prothrombin Time: 14.5

## 2011-08-19 LAB — TROPONIN I: Troponin I: 0.03

## 2011-08-19 LAB — SEDIMENTATION RATE: Sed Rate: 63 — ABNORMAL HIGH

## 2011-08-20 LAB — BASIC METABOLIC PANEL
BUN: 22
Calcium: 9.5
GFR calc non Af Amer: 53 — ABNORMAL LOW
Glucose, Bld: 127 — ABNORMAL HIGH
Sodium: 142

## 2011-08-20 LAB — URINALYSIS, ROUTINE W REFLEX MICROSCOPIC
Bilirubin Urine: NEGATIVE
Nitrite: NEGATIVE
Specific Gravity, Urine: 1.017
Urobilinogen, UA: 0.2

## 2011-08-20 LAB — PROTIME-INR: INR: 1

## 2011-08-20 LAB — APTT: aPTT: 30

## 2011-08-20 LAB — CBC
Hemoglobin: 13.6
Platelets: 198
RDW: 15

## 2011-08-22 LAB — COMPREHENSIVE METABOLIC PANEL
ALT: 21 U/L (ref 0–53)
Albumin: 3.9 g/dL (ref 3.5–5.2)
Alkaline Phosphatase: 69 U/L (ref 39–117)
GFR calc Af Amer: >60 mL/min (ref >60)
Potassium: 4.2 mEq/L (ref 3.5–5.1)
Sodium: 136 mEq/L (ref 135–145)
Total Protein: 6.8 g/dL (ref 6.0–8.3)

## 2011-08-22 LAB — CBC
Platelets: 233 10*3/uL (ref 150–400)
RDW: 15.1 % (ref 11.5–15.5)

## 2011-08-25 LAB — DIFFERENTIAL
Basophils Absolute: 0
Eosinophils Relative: 3
Lymphocytes Relative: 27
Monocytes Absolute: 0.5
Monocytes Relative: 8

## 2011-08-25 LAB — CBC
HCT: 43
Hemoglobin: 14.6
MCHC: 34
RBC: 4.78
RDW: 13.9

## 2011-08-25 LAB — BASIC METABOLIC PANEL
CO2: 31
GFR calc non Af Amer: >60
Glucose, Bld: 128 — ABNORMAL HIGH
Potassium: 4.6
Sodium: 146 — ABNORMAL HIGH

## 2011-09-12 ENCOUNTER — Ambulatory Visit (INDEPENDENT_AMBULATORY_CARE_PROVIDER_SITE_OTHER): Payer: Medicare Other | Admitting: *Deleted

## 2011-09-12 DIAGNOSIS — I4891 Unspecified atrial fibrillation: Secondary | ICD-10-CM

## 2011-09-12 DIAGNOSIS — Z7901 Long term (current) use of anticoagulants: Secondary | ICD-10-CM

## 2011-09-12 LAB — POCT INR: INR: 2.5

## 2011-10-17 ENCOUNTER — Other Ambulatory Visit: Payer: Self-pay | Admitting: Cardiology

## 2011-10-24 ENCOUNTER — Ambulatory Visit (INDEPENDENT_AMBULATORY_CARE_PROVIDER_SITE_OTHER): Payer: Medicare Other | Admitting: *Deleted

## 2011-10-24 DIAGNOSIS — Z7901 Long term (current) use of anticoagulants: Secondary | ICD-10-CM

## 2011-10-24 DIAGNOSIS — I4891 Unspecified atrial fibrillation: Secondary | ICD-10-CM

## 2011-12-04 ENCOUNTER — Other Ambulatory Visit: Payer: Self-pay | Admitting: Nurse Practitioner

## 2011-12-05 ENCOUNTER — Ambulatory Visit (INDEPENDENT_AMBULATORY_CARE_PROVIDER_SITE_OTHER): Payer: Medicare Other | Admitting: *Deleted

## 2011-12-05 DIAGNOSIS — Z7901 Long term (current) use of anticoagulants: Secondary | ICD-10-CM

## 2011-12-05 DIAGNOSIS — I4891 Unspecified atrial fibrillation: Secondary | ICD-10-CM

## 2011-12-05 LAB — POCT INR: INR: 2

## 2011-12-12 ENCOUNTER — Other Ambulatory Visit: Payer: Self-pay | Admitting: Nurse Practitioner

## 2011-12-12 ENCOUNTER — Other Ambulatory Visit: Payer: Self-pay | Admitting: Cardiology

## 2011-12-25 ENCOUNTER — Encounter: Payer: Self-pay | Admitting: Internal Medicine

## 2011-12-25 ENCOUNTER — Ambulatory Visit (INDEPENDENT_AMBULATORY_CARE_PROVIDER_SITE_OTHER): Payer: Medicare Other | Admitting: Internal Medicine

## 2011-12-25 VITALS — BP 136/80 | HR 73 | Resp 20 | Ht 72.0 in | Wt 334.0 lb

## 2011-12-25 DIAGNOSIS — Z950 Presence of cardiac pacemaker: Secondary | ICD-10-CM

## 2011-12-25 DIAGNOSIS — I4821 Permanent atrial fibrillation: Secondary | ICD-10-CM

## 2011-12-25 DIAGNOSIS — I10 Essential (primary) hypertension: Secondary | ICD-10-CM

## 2011-12-25 DIAGNOSIS — I495 Sick sinus syndrome: Secondary | ICD-10-CM

## 2011-12-25 DIAGNOSIS — I4891 Unspecified atrial fibrillation: Secondary | ICD-10-CM

## 2011-12-25 LAB — PACEMAKER DEVICE OBSERVATION
AL AMPLITUDE: 0.3 mv
BMOD-0003RV: 30
BMOD-0004RV: 5
BRDY-0002RV: 70 {beats}/min
BRDY-0004RV: 130 {beats}/min
RV LEAD IMPEDENCE PM: 472 Ohm
RV LEAD THRESHOLD: 1 V
VENTRICULAR PACING PM: 98.78

## 2011-12-25 NOTE — Progress Notes (Signed)
PCP:  [name], MD, MD  The patient presents today for routine electrophysiology followup.  Since last being seen in our clinic, the patient reports doing reasonably well.  He has lost a significant amount of weight since his last visit.  He finds that he feels "much better" with weight loss.  His exercise capacity has improved. Today, he denies symptoms of palpitations, chest pain, dizziness, presyncope, syncope, or neurologic sequela.  The patient feels that he is tolerating medications without difficulties and is otherwise without complaint today.   Past Medical History  Diagnosis Date  . Permanent atrial fibrillation     failed cardioversion in April of 2010  . Chronic anticoagulation   . Morbid obesity   . Osteoarthritis   . HTN (hypertension)   . 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 Urosepsis   . Prostate ca   . Nephrolithiasis   . Depression   . Gout   . Arthritis    Past Surgical History  Procedure Date  . Pacemaker placement 2010    Medtronic  . Total knee arthroplasty   . Radioactive seed implant   . Shoulder surgery     Current Outpatient Prescriptions  Medication Sig Dispense Refill  . allopurinol (ZYLOPRIM) 300 MG tablet TAKE ONE TABLET BY MOUTH EVERY DAY  30 tablet  4  . celecoxib (CELEBREX) 200 MG capsule Take 200 mg by mouth every other day.        . hydrochlorothiazide (HYDRODIURIL) 25 MG tablet TAKE ONE TABLET BY MOUTH EVERY DAY  30 tablet  3  . warfarin (COUMADIN) 5 MG tablet TAKE ONE AND ONE-HALF TABLETS BY MOUTH EVERY DAY  135 tablet  1  . losartan (COZAAR) 50 MG tablet Take 50 mg by mouth daily.          Allergies  Allergen Reactions  . Oxycodone   . Sertraline Hcl     History   Social History  . Marital Status: Widowed    Spouse Name: N/A    Number of Children: 1  . Years of Education: N/A   Occupational History  . Retired Administrator    Social History Main Topics  . Smoking status: Never Smoker   . Smokeless tobacco: Not on file  . Alcohol  Use: No  . Drug Use: No  . Sexually Active: No   Other Topics Concern  . Not on file   Social History Narrative  . No narrative on file    Family History  Problem Relation Age of Onset  . Heart attack Father     Physical Exam: Filed Vitals:   12/25/11 1414  BP: 136/80  Pulse: 73  Resp: 20  Height: 6' (1.829 m)  Weight: 334 lb (151.501 kg)    GEN- The patient is overweight appearing, alert and oriented x 3 today.   Head- normocephalic, atraumatic Eyes-  Sclera clear, conjunctiva pink Ears- hearing intact Oropharynx- clear Neck- supple, no JVP Lymph- no cervical lymphadenopathy Lungs- Clear to ausculation bilaterally, normal work of breathing Chest- pacemaker pocket is well healed Heart- Regular rate and rhythm (V paced) GI- soft, NT, ND, + BS Extremities- no clubbing, cyanosis,+1 edema  Pacemaker interrogation- reviewed in detail today,  See PACEART report  Assessment and Plan:

## 2011-12-25 NOTE — Assessment & Plan Note (Signed)
Normal pacemaker function See Pace Art report No changes today  

## 2011-12-25 NOTE — Assessment & Plan Note (Signed)
Stable No change required today Goal INR 2-3

## 2011-12-25 NOTE — Assessment & Plan Note (Signed)
Stable No change required today  

## 2011-12-25 NOTE — Patient Instructions (Addendum)
Your physician has requested that you have an echocardiogram. Echocardiography is a painless test that uses sound waves to create images of your heart. It provides your doctor with information about the size and shape of your heart and how well your heart's chambers and valves are working. This procedure takes approximately one hour. There are no restrictions for this procedure.  Your physician wants you to follow-up in: 1 year with [name].  You will receive a reminder letter in the mail two months in advance. If you don't receive a letter, please call our office to schedule the follow-up appointment.  Remote monitoring is used to monitor your Pacemaker of ICD from home. This monitoring reduces the number of office visits required to check your device to one time per year. It allows [place] to keep an eye on the functioning of your device to ensure it is working properly. You are scheduled for a device check from home on 03/25/12. You may send your transmission at any time that day. If you have a wireless device, the transmission will be sent automatically. After your physician reviews your transmission, you will receive a postcard with your next transmission date.

## 2011-12-25 NOTE — Assessment & Plan Note (Signed)
I am encouraged with his weight loss and have applauded his efforts.

## 2012-01-16 ENCOUNTER — Ambulatory Visit (HOSPITAL_COMMUNITY): Payer: Medicare Other | Attending: Cardiology

## 2012-01-16 ENCOUNTER — Ambulatory Visit (INDEPENDENT_AMBULATORY_CARE_PROVIDER_SITE_OTHER): Payer: Medicare Other | Admitting: *Deleted

## 2012-01-16 ENCOUNTER — Other Ambulatory Visit: Payer: Self-pay

## 2012-01-16 DIAGNOSIS — I4821 Permanent atrial fibrillation: Secondary | ICD-10-CM

## 2012-01-16 DIAGNOSIS — I10 Essential (primary) hypertension: Secondary | ICD-10-CM | POA: Insufficient documentation

## 2012-01-16 DIAGNOSIS — Z7901 Long term (current) use of anticoagulants: Secondary | ICD-10-CM

## 2012-01-16 DIAGNOSIS — I4891 Unspecified atrial fibrillation: Secondary | ICD-10-CM

## 2012-01-16 LAB — POCT INR: INR: 2

## 2012-01-21 ENCOUNTER — Telehealth: Payer: Self-pay | Admitting: Internal Medicine

## 2012-01-21 NOTE — Telephone Encounter (Signed)
FU Call: Pt returning call from our office regarding pt recent test results. Pleas return pt call to discuss further.

## 2012-01-21 NOTE — Telephone Encounter (Signed)
Spoke with patient and let him know he should restart the Losartan 50mg  daily(he found it in the cabinet after we talked yesterday)  I also let him know I have scheduled him to see [name] on 02/27/12 same day as he is having his coumadin level checked

## 2012-02-04 ENCOUNTER — Other Ambulatory Visit: Payer: Self-pay | Admitting: Nurse Practitioner

## 2012-02-27 ENCOUNTER — Encounter: Payer: Self-pay | Admitting: Nurse Practitioner

## 2012-02-27 ENCOUNTER — Ambulatory Visit (INDEPENDENT_AMBULATORY_CARE_PROVIDER_SITE_OTHER): Payer: Medicare Other | Admitting: Nurse Practitioner

## 2012-02-27 ENCOUNTER — Ambulatory Visit (INDEPENDENT_AMBULATORY_CARE_PROVIDER_SITE_OTHER): Payer: Medicare Other | Admitting: Pharmacist

## 2012-02-27 DIAGNOSIS — I4821 Permanent atrial fibrillation: Secondary | ICD-10-CM

## 2012-02-27 DIAGNOSIS — I4891 Unspecified atrial fibrillation: Secondary | ICD-10-CM

## 2012-02-27 DIAGNOSIS — Z950 Presence of cardiac pacemaker: Secondary | ICD-10-CM

## 2012-02-27 DIAGNOSIS — Z7901 Long term (current) use of anticoagulants: Secondary | ICD-10-CM

## 2012-02-27 DIAGNOSIS — I519 Heart disease, unspecified: Secondary | ICD-10-CM | POA: Insufficient documentation

## 2012-02-27 DIAGNOSIS — I5020 Unspecified systolic (congestive) heart failure: Secondary | ICD-10-CM

## 2012-02-27 DIAGNOSIS — K089 Disorder of teeth and supporting structures, unspecified: Secondary | ICD-10-CM | POA: Insufficient documentation

## 2012-02-27 LAB — BASIC METABOLIC PANEL
BUN: 22 mg/dL (ref 6–23)
CO2: 31 mEq/L (ref 19–32)
Calcium: 9.3 mg/dL (ref 8.4–10.5)
Chloride: 97 mEq/L (ref 96–112)
Creatinine, Ser: 1.2 mg/dL (ref 0.4–1.5)
GFR: 64.92 mL/min (ref >60.00)
Glucose, Bld: 123 mg/dL — ABNORMAL HIGH (ref 70–99)
Potassium: 3.5 mEq/L (ref 3.5–5.1)
Sodium: 139 mEq/L (ref 135–145)

## 2012-02-27 MED ORDER — CARVEDILOL 3.125 MG PO TABS: 3.1250 mg | ORAL_TABLET | Freq: Two times a day (BID) | ORAL | Status: DC

## 2012-02-27 NOTE — Assessment & Plan Note (Signed)
He has had several extractions and is currently being treated with antibiotics. If he needs a second round, I would refill.

## 2012-02-27 NOTE — Patient Instructions (Signed)
Stay on your current medicines  Finish your antibiotics. If you need a second round of antibiotics - call me and tell the nurse what the medicine is and I will refill  I am going to start you on Coreg 3.125 mg two times a day - this is to help with your heart's pumping  Minimize your salt  Keep up the good work with weight loss.  I will see you with your next coumadin check.  Call the [facility] office at [phone] if you have any questions, problems or concerns.

## 2012-02-27 NOTE — Assessment & Plan Note (Signed)
Rate is controlled. He remains on his coumadin.

## 2012-02-27 NOTE — Progress Notes (Signed)
[name] Date of Birth: Oct 19, 1941 Medical Record [id number]  History of Present Illness: [name] is seen back today for a follow up visit. He is seen for Dr. Rayann [name]. He has HTN, morbid obesity, chronic atrial fib and is on chronic anticoagulation with Coumadin. He does have a pacemaker in place.   He comes in today. He is here alone. Feels nervous and jittery since he had his device last checked.  EF has dropped to about 35% per recent echo. He is now back on his ARB therapy. He had been off of his ARB for some unknown reason. He is not having any chest pain. Says his breathing has improved with weight loss. He is down over 40 pounds. No swelling. Eating better. Very rarely dizzy. Has had some teeth pulled and had some infection. Currently on antibiotics but not sure what he is exactly taking.   Current Outpatient Prescriptions on File Prior to Visit  Medication Sig Dispense Refill  . allopurinol (ZYLOPRIM) 300 MG tablet TAKE ONE TABLET BY MOUTH EVERY DAY  30 tablet  4  . amoxicillin-clavulanate (AUGMENTIN) 875-125 MG per tablet       . celecoxib (CELEBREX) 200 MG capsule Take 200 mg by mouth every other day.        . clotrimazole-betamethasone (LOTRISONE) cream       . hydrochlorothiazide (HYDRODIURIL) 25 MG tablet TAKE ONE TABLET BY MOUTH EVERY DAY  30 tablet  3  . HYDROcodone-acetaminophen (LORCET) 10-650 MG per tablet       . losartan (COZAAR) 50 MG tablet TAKE ONE TABLET BY MOUTH EVERY DAY  30 tablet  6  . metFORMIN (GLUCOPHAGE) 500 MG tablet Take 500 mg by mouth daily with breakfast.       . warfarin (COUMADIN) 5 MG tablet TAKE ONE AND ONE-HALF TABLETS BY MOUTH EVERY DAY  135 tablet  1  . carvedilol (COREG) 3.125 MG tablet Take 1 tablet (3.125 mg total) by mouth 2 (two) times daily.  60 tablet  11    Allergies  Allergen Reactions  . Oxycodone   . Sertraline Hcl     Past Medical History  Diagnosis Date  . Permanent atrial fibrillation     failed cardioversion in April of  2010  . Chronic anticoagulation   . Morbid obesity   . Osteoarthritis   . HTN (hypertension)   . 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 Urosepsis   . Prostate ca   . Nephrolithiasis   . Depression   . Gout   . Arthritis   . LV dysfunction     EF down to 30% per echo Feb 2013    Past Surgical History  Procedure Date  . Pacemaker placement 2010    Medtronic  . Total knee arthroplasty   . Radioactive seed implant   . Shoulder surgery     History  Smoking status  . Never Smoker   Smokeless tobacco  . Not on file    History  Alcohol Use No    Family History  Problem Relation Age of Onset  . Heart attack Father     Review of Systems: The review of systems is positive for mouth soreness from recent extractions/infection.  All other systems were reviewed and are negative.  Physical Exam: BP 114/82  Pulse 68  Ht 6' (1.829 m)  Wt 327 lb (148.326 kg)  BMI 44.35 kg/m2 Patient is very pleasant and in no acute distress. He is morbidly obese but is thinner than I've  seen him in the past. Skin is warm and dry. Color is normal.  HEENT is unremarkable. Normocephalic/atraumatic. PERRL. Sclera are nonicteric. Neck is supple. No masses. No JVD. Lungs are clear. Cardiac exam shows an irregular rhythm. Rate is controlled. Abdomen is soft. Extremities are without edema. Gait and ROM are intact. No gross neurologic deficits noted.  LABORATORY DATA: Pacemaker check is ok. EKG shows pacing. BMET is pending  Assessment / Plan:

## 2012-02-27 NOTE — Assessment & Plan Note (Signed)
[name] presents today for follow up after his echo. This does show a reduction in his EF. He is back on his ARB. He is agreeable to trying some low dose Coreg. We will start him on 3.125 mg BID. While I would like to see him in just a couple of weeks, he chooses to not come back until his next INR. He is currently not having any symptoms. Patient is agreeable to this plan and will call if any problems develop in the interim.

## 2012-02-27 NOTE — Assessment & Plan Note (Signed)
He is encouraged to keep up with the efforts at weight loss. He is down 40 pounds in one year.

## 2012-02-27 NOTE — Assessment & Plan Note (Signed)
Pacemaker was rechecked today and is satisfactory. I suspect more of his jitteriness is just anxiety driven.

## 2012-03-01 ENCOUNTER — Telehealth: Payer: Self-pay | Admitting: *Deleted

## 2012-03-01 NOTE — Telephone Encounter (Signed)
Message copied by Lennox Grumbles on Mon Mar 01, 2012  9:53 AM ------      Message from: Burtis Junes      Created: Sun Feb 29, 2012 10:33 AM       Ok to report. Labs look ok.

## 2012-03-01 NOTE — Telephone Encounter (Signed)
Advised patient of results.  

## 2012-03-02 ENCOUNTER — Other Ambulatory Visit: Payer: Self-pay

## 2012-03-02 MED ORDER — HYDROCHLOROTHIAZIDE 25 MG PO TABS: 25.0000 mg | ORAL_TABLET | Freq: Every day | ORAL | Status: DC

## 2012-03-02 NOTE — Telephone Encounter (Signed)
.   Requested Prescriptions   Signed Prescriptions Disp Refills  . hydrochlorothiazide (HYDRODIURIL) 25 MG tablet 30 tablet 3    Sig: Take 1 tablet (25 mg total) by mouth daily.    Authorizing Provider: Thompson Grayer    Ordering User: Laurence Compton

## 2012-03-03 NOTE — Progress Notes (Signed)
Addended by: Janne Napoleon on: 03/03/2012 11:02 AM   Modules accepted: Orders

## 2012-03-20 ENCOUNTER — Other Ambulatory Visit: Payer: Self-pay | Admitting: Nurse Practitioner

## 2012-03-22 NOTE — Telephone Encounter (Signed)
..   Requested Prescriptions   Pending Prescriptions Disp Refills  . allopurinol (ZYLOPRIM) 300 MG tablet [Pharmacy Med Name: ALLOPURINOL 300MG    TAB] 30 tablet 4    Sig: TAKE ONE TABLET BY MOUTH EVERY DAY

## 2012-03-25 ENCOUNTER — Encounter: Payer: Medicare Other | Admitting: *Deleted

## 2012-04-01 ENCOUNTER — Ambulatory Visit (INDEPENDENT_AMBULATORY_CARE_PROVIDER_SITE_OTHER): Payer: Medicare Other | Admitting: Nurse Practitioner

## 2012-04-01 ENCOUNTER — Ambulatory Visit (INDEPENDENT_AMBULATORY_CARE_PROVIDER_SITE_OTHER): Payer: Medicare Other

## 2012-04-01 ENCOUNTER — Encounter: Payer: Self-pay | Admitting: Nurse Practitioner

## 2012-04-01 VITALS — BP 108/80 | HR 76 | Ht 72.0 in | Wt 322.0 lb

## 2012-04-01 DIAGNOSIS — I4891 Unspecified atrial fibrillation: Secondary | ICD-10-CM

## 2012-04-01 DIAGNOSIS — I4821 Permanent atrial fibrillation: Secondary | ICD-10-CM

## 2012-04-01 DIAGNOSIS — S46219A Strain of muscle, fascia and tendon of other parts of biceps, unspecified arm, initial encounter: Secondary | ICD-10-CM | POA: Insufficient documentation

## 2012-04-01 DIAGNOSIS — I519 Heart disease, unspecified: Secondary | ICD-10-CM

## 2012-04-01 LAB — POCT INR: INR: 1.8

## 2012-04-01 NOTE — Assessment & Plan Note (Signed)
Rate is controlled. He remains on his coumadin.

## 2012-04-01 NOTE — Progress Notes (Signed)
[name] Date of Birth: Jun 20, 1941 Medical Record [id number]  History of Present Illness: [name] is seen back today for a follow up visit. He is seen for [name]. He has HTN, morbid obesity, chronic atrial fib and is on coumadin. He does have a pacemaker in place. EF has dropped to 35% per recent echo. He is now on some low dose ARB and some low dose beta blocker.   He comes in today. He is here alone. He seems to be doing well. Continues to actively lose weight and is down another 5 pounds. He is doing this intentionally. He does tire easily but overall, has more energy. Still with some DOE. He does feel a little more lightheaded with his medicines but he is taking them. Overall, he is feeling better. His only complaint today is that he felt a "pop" in his arm after trying to work on a car.   Current Outpatient Prescriptions on File Prior to Visit  Medication Sig Dispense Refill  . allopurinol (ZYLOPRIM) 300 MG tablet TAKE ONE TABLET BY MOUTH EVERY DAY  30 tablet  4  . amoxicillin-clavulanate (AUGMENTIN) 875-125 MG per tablet       . carvedilol (COREG) 3.125 MG tablet Take 1 tablet (3.125 mg total) by mouth 2 (two) times daily.  60 tablet  11  . celecoxib (CELEBREX) 200 MG capsule Take 200 mg by mouth every other day.        . clotrimazole-betamethasone (LOTRISONE) cream       . hydrochlorothiazide (HYDRODIURIL) 25 MG tablet Take 1 tablet (25 mg total) by mouth daily.  30 tablet  3  . losartan (COZAAR) 50 MG tablet TAKE ONE TABLET BY MOUTH EVERY DAY  30 tablet  6  . warfarin (COUMADIN) 5 MG tablet TAKE ONE AND ONE-HALF TABLETS BY MOUTH EVERY DAY  135 tablet  1    Allergies  Allergen Reactions  . Oxycodone   . Sertraline Hcl     Past Medical History  Diagnosis Date  . Permanent atrial fibrillation     failed cardioversion in April of 2010  . Chronic anticoagulation   . Morbid obesity   . Osteoarthritis   . HTN (hypertension)   . 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 Urosepsis   .  Prostate ca   . Nephrolithiasis   . Depression   . Gout   . Arthritis   . LV dysfunction     EF down to 30% per echo Feb 2013 -  started on ARB and low dose beta blocker    Past Surgical History  Procedure Date  . Pacemaker placement 2010    Medtronic  . Total knee arthroplasty   . Radioactive seed implant   . Shoulder surgery     History  Smoking status  . Never Smoker   Smokeless tobacco  . Not on file    History  Alcohol Use No    Family History  Problem Relation Age of Onset  . Heart attack Father     Review of Systems: The review of systems is per the HPI.  All other systems were reviewed and are negative.  Physical Exam: BP 108/80  Pulse 76  Ht 6' (1.829 m)  Wt 322 lb (146.058 kg)  BMI 43.67 kg/m2 Patient is very pleasant and in no acute distress. He is morbidly obese. Skin is warm and dry. Color is normal.  HEENT is unremarkable. Normocephalic/atraumatic. PERRL. Sclera are nonicteric. Neck is supple. No masses. No JVD.  Lungs are clear. Cardiac exam shows a regular rate and rhythm. Abdomen is soft. Extremities are without edema. He has a protrusion of his bicep on the right with some resolving ecchymosis. Gait and ROM are intact. No gross neurologic deficits noted.  LABORATORY DATA: N/A   Assessment / Plan:

## 2012-04-01 NOTE — Assessment & Plan Note (Signed)
I offered to send him to ortho but he does not wish to pursue any evaluation or treatment. He has seen [facility] in the past and will let us know if he changes his mind.

## 2012-04-01 NOTE — Assessment & Plan Note (Signed)
He appears pretty compensated. He is a little lightheaded and dizzy. He is taking the Losartan and the low dose Coreg. I do not think he will tolerate further titration. Will consider repeat echo in a few months. I will see him back in 3 months.

## 2012-04-01 NOTE — Patient Instructions (Addendum)
Stay on all your medicine  Let me know if you want to see an orthopedist about your arm  I want to see you in August with your coumadin appointment.  Call the [facility] office at [phone] if you have any questions, problems or concerns.

## 2012-04-02 ENCOUNTER — Encounter: Payer: Self-pay | Admitting: *Deleted

## 2012-04-09 ENCOUNTER — Ambulatory Visit: Payer: Medicare Other | Admitting: Nurse Practitioner

## 2012-04-13 ENCOUNTER — Ambulatory Visit (INDEPENDENT_AMBULATORY_CARE_PROVIDER_SITE_OTHER): Payer: Medicare Other | Admitting: Internal Medicine

## 2012-04-13 ENCOUNTER — Encounter: Payer: Self-pay | Admitting: Internal Medicine

## 2012-04-13 DIAGNOSIS — I495 Sick sinus syndrome: Secondary | ICD-10-CM

## 2012-04-15 ENCOUNTER — Encounter: Payer: Self-pay | Admitting: *Deleted

## 2012-04-15 LAB — REMOTE PACEMAKER DEVICE
BATTERY VOLTAGE: 2.95 V
BMOD-0002RV: 7
BMOD-0003RV: 30
BRDY-0004RV: 130 {beats}/min
RV LEAD AMPLITUDE: 12.7923 mv
RV LEAD IMPEDENCE PM: 464 Ohm
VENTRICULAR PACING PM: 99.55

## 2012-04-15 NOTE — Progress Notes (Signed)
PPM remote 

## 2012-04-28 ENCOUNTER — Encounter: Payer: Self-pay | Admitting: *Deleted

## 2012-04-29 ENCOUNTER — Ambulatory Visit (INDEPENDENT_AMBULATORY_CARE_PROVIDER_SITE_OTHER): Payer: Medicare Other | Admitting: *Deleted

## 2012-04-29 DIAGNOSIS — I4821 Permanent atrial fibrillation: Secondary | ICD-10-CM

## 2012-04-29 DIAGNOSIS — I4891 Unspecified atrial fibrillation: Secondary | ICD-10-CM

## 2012-04-29 LAB — POCT INR: INR: 2.1

## 2012-05-27 ENCOUNTER — Ambulatory Visit (INDEPENDENT_AMBULATORY_CARE_PROVIDER_SITE_OTHER): Payer: Medicare Other | Admitting: *Deleted

## 2012-05-27 DIAGNOSIS — I4891 Unspecified atrial fibrillation: Secondary | ICD-10-CM

## 2012-05-27 DIAGNOSIS — I4821 Permanent atrial fibrillation: Secondary | ICD-10-CM

## 2012-05-27 LAB — POCT INR: INR: 1.8

## 2012-06-05 ENCOUNTER — Other Ambulatory Visit: Payer: Self-pay | Admitting: Cardiology

## 2012-06-17 ENCOUNTER — Ambulatory Visit (INDEPENDENT_AMBULATORY_CARE_PROVIDER_SITE_OTHER): Payer: Medicare Other | Admitting: *Deleted

## 2012-06-17 DIAGNOSIS — I4821 Permanent atrial fibrillation: Secondary | ICD-10-CM

## 2012-06-17 DIAGNOSIS — I4891 Unspecified atrial fibrillation: Secondary | ICD-10-CM

## 2012-06-17 LAB — POCT INR: INR: 1.9

## 2012-07-02 ENCOUNTER — Ambulatory Visit (INDEPENDENT_AMBULATORY_CARE_PROVIDER_SITE_OTHER): Payer: Medicare Other | Admitting: Nurse Practitioner

## 2012-07-02 ENCOUNTER — Ambulatory Visit (INDEPENDENT_AMBULATORY_CARE_PROVIDER_SITE_OTHER): Payer: Medicare Other

## 2012-07-02 ENCOUNTER — Encounter: Payer: Self-pay | Admitting: Nurse Practitioner

## 2012-07-02 VITALS — BP 133/76 | HR 74 | Resp 18 | Ht 72.0 in | Wt 313.8 lb

## 2012-07-02 DIAGNOSIS — I4821 Permanent atrial fibrillation: Secondary | ICD-10-CM

## 2012-07-02 DIAGNOSIS — I519 Heart disease, unspecified: Secondary | ICD-10-CM

## 2012-07-02 DIAGNOSIS — I4891 Unspecified atrial fibrillation: Secondary | ICD-10-CM

## 2012-07-02 LAB — POCT INR: INR: 1.8

## 2012-07-02 NOTE — Assessment & Plan Note (Signed)
His EF was 35%. He is on just low dose beta blocker and ARB. He is not really wanting any titration of his medicines. Wants to hold off on repeating his echo. I will see him back in about 3 months. He knows to call if he needs anything. Patient is agreeable to this plan and will call if any problems develop in the interim.

## 2012-07-02 NOTE — Assessment & Plan Note (Signed)
He is actively losing weight. I have encouraged him to keep up the good work.

## 2012-07-02 NOTE — Progress Notes (Signed)
[name] Date of Birth: [birth year] Medical Record [id number]  History of Present Illness: [name] is seen today for a 3 month check. He is seen for [name]. He has HTN, morbid obesity, chronic atrial fib on coumadin and has a pacemaker in place. His EF is 35% per echo earlier this year. He is now on some low dose ARB and beta blocker therapy.   He comes in today. He is doing well. He has lost almost 75 pounds over the past year. He does feel better. Still limited by his knees and hips but still able to get out and about. No chest pain. Feels ok on his current medicines. Doesn't really want to take any more medicines.   Current Outpatient Prescriptions on File Prior to Visit  Medication Sig Dispense Refill  . allopurinol (ZYLOPRIM) 300 MG tablet TAKE ONE TABLET BY MOUTH EVERY DAY  30 tablet  4  . amoxicillin-clavulanate (AUGMENTIN) 875-125 MG per tablet       . carvedilol (COREG) 3.125 MG tablet Take 1 tablet (3.125 mg total) by mouth 2 (two) times daily.  60 tablet  11  . celecoxib (CELEBREX) 200 MG capsule Take 200 mg by mouth every other day.        . clotrimazole-betamethasone (LOTRISONE) cream       . hydrochlorothiazide (HYDRODIURIL) 25 MG tablet Take 1 tablet (25 mg total) by mouth daily.  30 tablet  3  . losartan (COZAAR) 50 MG tablet TAKE ONE TABLET BY MOUTH EVERY DAY  30 tablet  6  . warfarin (COUMADIN) 5 MG tablet TAKE ONE & ONE-HALF TABLETS BY MOUTH EVERY DAY  135 tablet  1    Allergies  Allergen Reactions  . Oxycodone   . Sertraline Hcl     Past Medical History  Diagnosis Date  . Permanent atrial fibrillation     failed cardioversion in April of 2010  . Chronic anticoagulation   . Morbid obesity   . Osteoarthritis   . HTN (hypertension)   . 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 Urosepsis   . Prostate ca   . Nephrolithiasis   . Depression   . Gout   . Arthritis   . LV dysfunction     EF down to 30% per echo Feb 2013 -  started on ARB and low dose beta blocker     Past Surgical History  Procedure Date  . Pacemaker placement 2010    Medtronic  . Total knee arthroplasty   . Radioactive seed implant   . Shoulder surgery     History  Smoking status  . Never Smoker   Smokeless tobacco  . Not on file    History  Alcohol Use No    Family History  Problem Relation Age of Onset  . Heart attack Father     Review of Systems: The review of systems is per the HPI.  All other systems were reviewed and are negative.  Physical Exam: BP 133/76  Pulse 74  Resp 18  Ht 6' (1.829 m)  Wt 313 lb 12.8 oz (142.339 kg)  BMI 42.56 kg/m2  SpO2 96% Patient is very pleasant and in no acute distress. He is obese but is actively losing weight. Skin is warm and dry. Color is normal.  HEENT is unremarkable. Normocephalic/atraumatic. PERRL. Sclera are nonicteric. Neck is supple. No masses. No JVD. Lungs are clear. Cardiac exam shows an irregular rhythm.His rate is controlled.  Abdomen is obese but soft. Extremities are full but  without significant edema. Gait and ROM are intact. No gross neurologic deficits noted.   LABORATORY DATA:  Lab Results  Component Value Date   INR 1.8 07/02/2012   INR 1.9 06/17/2012   INR 1.8 05/27/2012    Assessment / Plan:

## 2012-07-02 NOTE — Patient Instructions (Addendum)
I am very happy with how you are doing  Keep up the good work  Stay on your current medicines  I will see you in 3 months. 10/04/12 @ 2 pm with Truitt Merle, NP  Call the [facility] office at [phone] if you have any questions, problems or concerns.

## 2012-07-02 NOTE — Assessment & Plan Note (Signed)
His rate is controlled. His coumadin has been running a little lower here lately. I suspect it is due to his diet. He has done a great job with losing weight.

## 2012-07-15 ENCOUNTER — Other Ambulatory Visit: Payer: Self-pay | Admitting: Internal Medicine

## 2012-07-16 NOTE — Telephone Encounter (Signed)
Fax Received. Refill Completed. Yuliya Nova Chowoe (R.M.A)   

## 2012-07-22 ENCOUNTER — Encounter: Payer: Medicare Other | Admitting: *Deleted

## 2012-07-28 ENCOUNTER — Encounter: Payer: Self-pay | Admitting: *Deleted

## 2012-07-30 ENCOUNTER — Ambulatory Visit (INDEPENDENT_AMBULATORY_CARE_PROVIDER_SITE_OTHER): Payer: Medicare Other | Admitting: *Deleted

## 2012-07-30 DIAGNOSIS — I4891 Unspecified atrial fibrillation: Secondary | ICD-10-CM

## 2012-07-30 DIAGNOSIS — I4821 Permanent atrial fibrillation: Secondary | ICD-10-CM

## 2012-08-06 ENCOUNTER — Ambulatory Visit (INDEPENDENT_AMBULATORY_CARE_PROVIDER_SITE_OTHER): Payer: Medicare Other | Admitting: *Deleted

## 2012-08-06 ENCOUNTER — Encounter: Payer: Self-pay | Admitting: Internal Medicine

## 2012-08-06 DIAGNOSIS — Z950 Presence of cardiac pacemaker: Secondary | ICD-10-CM

## 2012-08-06 DIAGNOSIS — I495 Sick sinus syndrome: Secondary | ICD-10-CM

## 2012-08-06 LAB — REMOTE PACEMAKER DEVICE
BATTERY VOLTAGE: 2.95 V
RV LEAD IMPEDENCE PM: 488 Ohm
VENTRICULAR PACING PM: 99.13

## 2012-08-19 ENCOUNTER — Other Ambulatory Visit: Payer: Self-pay | Admitting: Nurse Practitioner

## 2012-08-20 ENCOUNTER — Encounter: Payer: Self-pay | Admitting: *Deleted

## 2012-08-27 ENCOUNTER — Ambulatory Visit (INDEPENDENT_AMBULATORY_CARE_PROVIDER_SITE_OTHER): Payer: Medicare Other | Admitting: *Deleted

## 2012-08-27 DIAGNOSIS — I4891 Unspecified atrial fibrillation: Secondary | ICD-10-CM

## 2012-08-27 DIAGNOSIS — I4821 Permanent atrial fibrillation: Secondary | ICD-10-CM

## 2012-09-24 ENCOUNTER — Ambulatory Visit (INDEPENDENT_AMBULATORY_CARE_PROVIDER_SITE_OTHER): Payer: Medicare Other | Admitting: *Deleted

## 2012-09-24 DIAGNOSIS — I4821 Permanent atrial fibrillation: Secondary | ICD-10-CM

## 2012-09-24 DIAGNOSIS — I4891 Unspecified atrial fibrillation: Secondary | ICD-10-CM

## 2012-10-01 ENCOUNTER — Other Ambulatory Visit: Payer: Self-pay | Admitting: *Deleted

## 2012-10-01 MED ORDER — LOSARTAN POTASSIUM 50 MG PO TABS: 50.0000 mg | ORAL_TABLET | Freq: Every day | ORAL | Status: DC

## 2012-10-04 ENCOUNTER — Ambulatory Visit: Payer: Medicare Other | Admitting: Nurse Practitioner

## 2012-10-25 ENCOUNTER — Ambulatory Visit: Payer: Medicare Other | Admitting: Nurse Practitioner

## 2012-10-29 ENCOUNTER — Encounter: Payer: Self-pay | Admitting: Nurse Practitioner

## 2012-10-29 ENCOUNTER — Ambulatory Visit (INDEPENDENT_AMBULATORY_CARE_PROVIDER_SITE_OTHER): Payer: Medicare Other | Admitting: Nurse Practitioner

## 2012-10-29 ENCOUNTER — Ambulatory Visit (INDEPENDENT_AMBULATORY_CARE_PROVIDER_SITE_OTHER): Payer: Medicare Other

## 2012-10-29 VITALS — BP 126/80 | HR 64 | Ht 72.0 in | Wt 326.8 lb

## 2012-10-29 DIAGNOSIS — I4821 Permanent atrial fibrillation: Secondary | ICD-10-CM

## 2012-10-29 DIAGNOSIS — I519 Heart disease, unspecified: Secondary | ICD-10-CM

## 2012-10-29 DIAGNOSIS — I4891 Unspecified atrial fibrillation: Secondary | ICD-10-CM

## 2012-10-29 NOTE — Patient Instructions (Signed)
Stay on your current medicines  I will see you in about 4 months  Get back on track!!!  Call the [place] office at [phone] if you have any questions, problems or concerns.

## 2012-10-29 NOTE — Progress Notes (Signed)
[name] Date of Birth: Oct 24, 1941 Medical Record [id number]  History of Present Illness: [name] is seen today for a 3 month check. He has HTN, morbid obesity, chronic atrial fib on coumadin and has a pacemaker in place. His EF is 35% per echo back in March. Very reluctant to take more medicines and does not want a lot [name] for him. He is on some low dose ARB and BB.  He comes in today. He is here with a new friend, [name]. He is doing ok. Has a few skin lesions and probably needs to see the dermatologist. No chest pain. He has stable DOE. Limited by his hip pain but tries to get out and stay active. He notes that he has gotten off track with his diet. Back drinking diet Dr. [name].   Current Outpatient Prescriptions on File Prior to Visit  Medication Sig Dispense Refill  . allopurinol (ZYLOPRIM) 300 MG tablet TAKE ONE TABLET BY MOUTH EVERY DAY  30 tablet  3  . carvedilol (COREG) 3.125 MG tablet Take 1 tablet (3.125 mg total) by mouth 2 (two) times daily.  60 tablet  11  . celecoxib (CELEBREX) 200 MG capsule Take 200 mg by mouth every other day.        . clotrimazole-betamethasone (LOTRISONE) cream Apply 1 application topically as needed.       . hydrochlorothiazide (HYDRODIURIL) 25 MG tablet TAKE ONE TABLET BY MOUTH EVERY DAY  30 tablet  5  . losartan (COZAAR) 50 MG tablet Take 1 tablet (50 mg total) by mouth daily.  30 tablet  7  . warfarin (COUMADIN) 5 MG tablet TAKE ONE & ONE-HALF TABLETS BY MOUTH EVERY DAY  135 tablet  1    Allergies  Allergen Reactions  . Oxycodone   . Sertraline Hcl     Past Medical History  Diagnosis Date  . Permanent atrial fibrillation     failed cardioversion in April of 2010  . Chronic anticoagulation   . Morbid obesity   . Osteoarthritis   . HTN (hypertension)   . 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 Urosepsis   . Prostate ca   . Nephrolithiasis   . Depression   . Gout   . Arthritis   . LV dysfunction     EF down to 30% per echo Feb 2013 -  started on  ARB and low dose beta blocker    Past Surgical History  Procedure Date  . Pacemaker placement 2010    Medtronic  . Total knee arthroplasty   . Radioactive seed implant   . Shoulder surgery     History  Smoking status  . Never Smoker   Smokeless tobacco  . Not on file    History  Alcohol Use No    Family History  Problem Relation Age of Onset  . Heart attack Father     Review of Systems: The review of systems is per the HPI.  All other systems were reviewed and are negative.  Physical Exam: BP 126/80  Pulse 64  Ht 6' (1.829 m)  Wt 326 lb 12.8 oz (148.236 kg)  BMI 44.32 kg/m2 Patient is very pleasant and in no acute distress. He is morbidly obese. Weight is up 13 pounds today. Skin is warm and dry. Color is normal.  HEENT is unremarkable. Normocephalic/atraumatic. PERRL. Sclera are nonicteric. Neck is supple. No masses. No JVD. Lungs are clear. Cardiac exam shows an irregular rhythm. Rate is controlled. Abdomen is obese but soft. Extremities  are quite full with edema which is chronic. Gait and ROM are intact. No gross neurologic deficits noted.   LABORATORY DATA:    Chemistry      Component Value Date/Time   NA 139 02/27/2012 1340   K 3.5 02/27/2012 1340   CL 97 02/27/2012 1340   CO2 31 02/27/2012 1340   BUN 22 02/27/2012 1340   CREATININE 1.2 02/27/2012 1340   CREATININE 1.11 02/10/2011 1613      Component Value Date/Time   CALCIUM 9.3 02/27/2012 1340   ALKPHOS 69 11/03/2008 0913   AST 19 11/03/2008 0913   ALT 21 11/03/2008 0913   BILITOT 0.8 11/03/2008 0913       Assessment / Plan: 1. Chronic atrial fib - on coumadin. Rate is controlled. Checking INR today.  2. Morbid obesity - he knows he has to get back on track.   3. LV dysfunction - not symptomatic. Not interested in more medicine or further testing.   I have left him on his current regimen. See him back in 4 months. Will consider repeat echo on return if he is agreeable. Check labs on  return.  Patient is agreeable to this plan and will call if any problems develop in the interim.

## 2012-11-08 ENCOUNTER — Ambulatory Visit (INDEPENDENT_AMBULATORY_CARE_PROVIDER_SITE_OTHER): Payer: Medicare Other | Admitting: *Deleted

## 2012-11-08 DIAGNOSIS — I495 Sick sinus syndrome: Secondary | ICD-10-CM

## 2012-11-08 DIAGNOSIS — Z950 Presence of cardiac pacemaker: Secondary | ICD-10-CM

## 2012-11-15 LAB — REMOTE PACEMAKER DEVICE
AL IMPEDENCE PM: 400 Ohm
BMOD-0004RV: 5
RV LEAD IMPEDENCE PM: 384 Ohm
VENTRICULAR PACING PM: 98.82

## 2012-11-18 ENCOUNTER — Other Ambulatory Visit: Payer: Self-pay | Admitting: *Deleted

## 2012-11-18 MED ORDER — WARFARIN SODIUM 5 MG PO TABS: 5.0000 mg | ORAL_TABLET | ORAL | Status: DC

## 2012-11-23 ENCOUNTER — Encounter: Payer: Self-pay | Admitting: *Deleted

## 2012-11-25 ENCOUNTER — Encounter: Payer: Self-pay | Admitting: Internal Medicine

## 2012-12-10 ENCOUNTER — Ambulatory Visit (INDEPENDENT_AMBULATORY_CARE_PROVIDER_SITE_OTHER): Payer: Medicare Other

## 2012-12-10 DIAGNOSIS — I4821 Permanent atrial fibrillation: Secondary | ICD-10-CM

## 2012-12-10 DIAGNOSIS — I4891 Unspecified atrial fibrillation: Secondary | ICD-10-CM

## 2012-12-21 ENCOUNTER — Other Ambulatory Visit: Payer: Self-pay | Admitting: Nurse Practitioner

## 2013-01-18 ENCOUNTER — Other Ambulatory Visit: Payer: Self-pay | Admitting: Emergency Medicine

## 2013-01-18 MED ORDER — HYDROCHLOROTHIAZIDE 25 MG PO TABS: ORAL_TABLET | ORAL | Status: DC

## 2013-01-25 ENCOUNTER — Ambulatory Visit (INDEPENDENT_AMBULATORY_CARE_PROVIDER_SITE_OTHER): Payer: Medicare Other | Admitting: *Deleted

## 2013-01-25 DIAGNOSIS — I4891 Unspecified atrial fibrillation: Secondary | ICD-10-CM

## 2013-01-25 DIAGNOSIS — I4821 Permanent atrial fibrillation: Secondary | ICD-10-CM

## 2013-02-02 ENCOUNTER — Other Ambulatory Visit: Payer: Self-pay | Admitting: Emergency Medicine

## 2013-02-02 MED ORDER — LOSARTAN POTASSIUM 50 MG PO TABS: 50.0000 mg | ORAL_TABLET | Freq: Every day | ORAL | Status: DC

## 2013-02-09 ENCOUNTER — Encounter: Payer: Self-pay | Admitting: Nurse Practitioner

## 2013-02-25 ENCOUNTER — Ambulatory Visit: Payer: Medicare Other | Admitting: Nurse Practitioner

## 2013-03-03 ENCOUNTER — Other Ambulatory Visit: Payer: Self-pay | Admitting: Nurse Practitioner

## 2013-03-08 ENCOUNTER — Ambulatory Visit (INDEPENDENT_AMBULATORY_CARE_PROVIDER_SITE_OTHER): Payer: Medicare Other | Admitting: Nurse Practitioner

## 2013-03-08 ENCOUNTER — Ambulatory Visit (INDEPENDENT_AMBULATORY_CARE_PROVIDER_SITE_OTHER): Payer: Medicare Other | Admitting: *Deleted

## 2013-03-08 ENCOUNTER — Encounter: Payer: Self-pay | Admitting: Nurse Practitioner

## 2013-03-08 VITALS — BP 120/86 | HR 60 | Ht 72.0 in | Wt 341.0 lb

## 2013-03-08 DIAGNOSIS — I4891 Unspecified atrial fibrillation: Secondary | ICD-10-CM

## 2013-03-08 DIAGNOSIS — I4821 Permanent atrial fibrillation: Secondary | ICD-10-CM

## 2013-03-08 DIAGNOSIS — T148XXA Other injury of unspecified body region, initial encounter: Secondary | ICD-10-CM

## 2013-03-08 LAB — CBC WITH DIFFERENTIAL/PLATELET
Basophils Absolute: 0 10*3/uL (ref 0.0–0.1)
Basophils Relative: 0.5 % (ref 0.0–3.0)
Eosinophils Absolute: 0.2 10*3/uL (ref 0.0–0.7)
Eosinophils Relative: 2.9 % (ref 0.0–5.0)
HCT: 44.7 % (ref 39.0–52.0)
Hemoglobin: 15.1 g/dL (ref 13.0–17.0)
Lymphocytes Relative: 25.6 % (ref 12.0–46.0)
Lymphs Abs: 1.6 10*3/uL (ref 0.7–4.0)
MCHC: 33.8 g/dL (ref 30.0–36.0)
MCV: 89.2 fl (ref 78.0–100.0)
Monocytes Absolute: 0.6 10*3/uL (ref 0.1–1.0)
Monocytes Relative: 9 % (ref 3.0–12.0)
Neutro Abs: 3.9 10*3/uL (ref 1.4–7.7)
Neutrophils Relative %: 62 % (ref 43.0–77.0)
Platelets: 178 10*3/uL (ref 150.0–400.0)
RBC: 5.01 Mil/uL (ref 4.22–5.81)
RDW: 14.8 % — ABNORMAL HIGH (ref 11.5–14.6)
WBC: 6.3 10*3/uL (ref 4.5–10.5)

## 2013-03-08 LAB — BASIC METABOLIC PANEL
BUN: 20 mg/dL (ref 6–23)
CO2: 32 mEq/L (ref 19–32)
Calcium: 9 mg/dL (ref 8.4–10.5)
Chloride: 98 mEq/L (ref 96–112)
Creatinine, Ser: 1.1 mg/dL (ref 0.4–1.5)
GFR: 72.47 mL/min (ref >60.00)
Glucose, Bld: 119 mg/dL — ABNORMAL HIGH (ref 70–99)
Potassium: 3.6 mEq/L (ref 3.5–5.1)
Sodium: 136 mEq/L (ref 135–145)

## 2013-03-08 NOTE — Progress Notes (Signed)
[name] Date of Birth: Jun 20, 1941 Medical Record [id number]  History of Present Illness: [name] is seen back today for his 4 month check. He is seen for [name]. He has HTN, morbid obesity, chronic atrial fib on coumadin and has a pacemaker in place. EF is 35% per echo in March 2013. He has been very reluctant to take more medicines and has not wanted a lot [name] for him. He is on low dose ARB and BB.   Seen back in December. He was doing well. Had gotten off track with losing weight. Likes diet Dr. [name].   He comes in today. He is here alone. Doing ok. He is still off of track. Has gained 15 pounds back. Back drinking diet soda. Taking more OTC agents for shoulder pain. Has not been to talk with Dr. Virgina Jock. He is not sure what it is. He is bruising more and I suspect it is due to this reason. Breathing is at baseline. Due for his pacemaker check and visit with [name]. His financial limitations unfortunately result in limited evaluation/follow up.   Current Outpatient Prescriptions on File Prior to Visit  Medication Sig Dispense Refill  . allopurinol (ZYLOPRIM) 300 MG tablet TAKE ONE TABLET BY MOUTH EVERY DAY  30 tablet  2  . carvedilol (COREG) 3.125 MG tablet TAKE ONE TABLET BY MOUTH TWICE DAILY  60 tablet  3  . celecoxib (CELEBREX) 200 MG capsule Take 200 mg by mouth every other day.        . clotrimazole-betamethasone (LOTRISONE) cream Apply 1 application topically as needed.       . hydrochlorothiazide (HYDRODIURIL) 25 MG tablet TAKE ONE TABLET BY MOUTH EVERY DAY  30 tablet  5  . losartan (COZAAR) 50 MG tablet Take 1 tablet (50 mg total) by mouth daily.  30 tablet  7  . warfarin (COUMADIN) 5 MG tablet Take 1 tablet (5 mg total) by mouth as directed.  150 tablet  1   No current facility-administered medications on file prior to visit.    Allergies  Allergen Reactions  . Oxycodone   . Sertraline Hcl     Past Medical History  Diagnosis Date  . Permanent atrial  fibrillation     failed cardioversion in April of 2010  . Chronic anticoagulation   . Morbid obesity   . Osteoarthritis   . HTN (hypertension)   . 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 Urosepsis   . Prostate ca   . Nephrolithiasis   . Depression   . Gout   . Arthritis   . LV dysfunction     EF down to 30% per echo Feb 2013 -  started on ARB and low dose beta blocker    Past Surgical History  Procedure Laterality Date  . Pacemaker placement  2010    Medtronic  . Total knee arthroplasty    . Radioactive seed implant    . Shoulder surgery      History  Smoking status  . Never Smoker   Smokeless tobacco  . Not on file    History  Alcohol Use No    Family History  Problem Relation Age of Onset  . Heart attack Father     Review of Systems: The review of systems is per the HPI.  All other systems were reviewed and are negative.  Physical Exam: BP 120/86  Pulse 60  Ht 6' (1.829 m)  Wt 341 lb (154.677 kg)  BMI 46.24 kg/m2 Patient  is very pleasant and in no acute distress. He is morbidly obese. Skin is warm and dry. Color is normal.  HEENT is unremarkable. Normocephalic/atraumatic. PERRL. Sclera are nonicteric. Neck is supple. No masses. No JVD. Lungs are clear. Cardiac exam shows an irregular rhythm. Rate is ok. Abdomen is obese but soft. Extremities are without edema. Gait and ROM are intact. No gross neurologic deficits noted.  LABORATORY DATA:   Chemistry      Component Value Date/Time   NA 139 02/27/2012 1340   K 3.5 02/27/2012 1340   CL 97 02/27/2012 1340   CO2 31 02/27/2012 1340   BUN 22 02/27/2012 1340   CREATININE 1.2 02/27/2012 1340   CREATININE 1.11 02/10/2011 1613      Component Value Date/Time   CALCIUM 9.3 02/27/2012 1340   ALKPHOS 69 11/03/2008 0913   AST 19 11/03/2008 0913   ALT 21 11/03/2008 0913   BILITOT 0.8 11/03/2008 0913     Lab Results  Component Value Date   CHOL  Value: 126        ATP III CLASSIFICATION:  <200     mg/dL   Desirable  200-239  mg/dL    Borderline High  >=240    mg/dL   High 09/15/2008   HDL 11* 09/15/2008   LDLCALC  Value: 79        Total Cholesterol/HDL:CHD Risk Coronary Heart Disease Risk Table                     Men   Women  1/2 Average Risk   3.4   3.3 09/15/2008   TRIG 181* 09/15/2008   CHOLHDL 11.5 09/15/2008   Lab Results  Component Value Date   INR 2.3 03/08/2013   INR 2.9 01/25/2013   INR 2.1 12/10/2012   CBC    Component Value Date/Time   WBC 5.5 11/03/2008 0913   RBC 4.51 11/03/2008 0913   HGB 13.8 11/03/2008 0913   HCT 41.0 11/03/2008 0913   PLT 233 11/03/2008 0913   MCV 90.9 11/03/2008 0913   MCHC 33.6 11/03/2008 0913   RDW 15.1 11/03/2008 0913   LYMPHSABS 1.2 09/18/2008 0404   MONOABS 0.9 09/18/2008 0404   EOSABS 0.1 09/18/2008 0404   BASOSABS 0.0 09/18/2008 0404      Assessment / Plan:  1. LV systolic failure - EF is [id number] per echo March 2013 - on low dose ARB and BB - very reluctant to take more medicines. I have left him on his current regimen.   2. Morbid obesity - tried to be encouraging. He drinks lots of soda.   3. Chronic atrial fib - on coumadin - pacemaker in place. Overdue for his check. Will try to get in 6 weeks when his coumadin is due.   4. Bruising - probably from combination of NSAID and coumadin use. We will check labs today. I have encouraged him to try and use Tylenol and to see Dr. Virgina Jock.   Patient is agreeable to this plan and will call if any problems develop in the interim.   Burtis Junes, RN, ANP-C [facility] [address] [place], [place]  [id number]

## 2013-03-08 NOTE — Patient Instructions (Addendum)
Stay on your current medicines  We need to check some lab today  Get back on track with your weight loss  Go see Dr. Virgina Jock  I will see you in 6 months  Schedule a visit with [name] for your pacemaker check - you are due for this now - ok to do with next INR  Call the [place] office at [phone] if you have any questions, problems or concerns.

## 2013-03-27 ENCOUNTER — Other Ambulatory Visit: Payer: Self-pay | Admitting: Nurse Practitioner

## 2013-04-20 ENCOUNTER — Encounter: Payer: Self-pay | Admitting: Internal Medicine

## 2013-04-20 ENCOUNTER — Ambulatory Visit (INDEPENDENT_AMBULATORY_CARE_PROVIDER_SITE_OTHER): Payer: Medicare Other | Admitting: *Deleted

## 2013-04-20 ENCOUNTER — Ambulatory Visit (INDEPENDENT_AMBULATORY_CARE_PROVIDER_SITE_OTHER): Payer: Medicare Other | Admitting: Internal Medicine

## 2013-04-20 VITALS — BP 110/78 | HR 70 | Ht 72.5 in | Wt 342.2 lb

## 2013-04-20 DIAGNOSIS — I4821 Permanent atrial fibrillation: Secondary | ICD-10-CM

## 2013-04-20 DIAGNOSIS — I4891 Unspecified atrial fibrillation: Secondary | ICD-10-CM

## 2013-04-20 DIAGNOSIS — I5022 Chronic systolic (congestive) heart failure: Secondary | ICD-10-CM

## 2013-04-20 DIAGNOSIS — I495 Sick sinus syndrome: Secondary | ICD-10-CM

## 2013-04-20 DIAGNOSIS — I442 Atrioventricular block, complete: Secondary | ICD-10-CM

## 2013-04-20 DIAGNOSIS — Z950 Presence of cardiac pacemaker: Secondary | ICD-10-CM

## 2013-04-20 LAB — PACEMAKER DEVICE OBSERVATION
AL IMPEDENCE PM: 432 Ohm
BMOD-0002RV: 7
BMOD-0003RV: 30
RV LEAD IMPEDENCE PM: 424 Ohm
RV LEAD THRESHOLD: 1 V

## 2013-04-20 LAB — POCT INR: INR: 2.2

## 2013-04-20 NOTE — Patient Instructions (Addendum)
Remote monitoring is used to monitor your Pacemaker of ICD from home. This monitoring reduces the number of office visits required to check your device to one time per year. It allows [place] to keep an eye on the functioning of your device to ensure it is working properly. You are scheduled for a device check from home on July 25, 2013. You may send your transmission at any time that day. If you have a wireless device, the transmission will be sent automatically. After your physician reviews your transmission, you will receive a postcard with your next transmission date.  Your physician wants you to follow-up in: 1 year with Dr Rayann Heman.  You will receive a reminder letter in the mail two months in advance. If you don't receive a letter, please call our office to schedule the follow-up appointment.

## 2013-04-20 NOTE — Progress Notes (Signed)
PCP:  Donald Reel, MD  The patient presents today for routine electrophysiology followup.  Since last being seen in our clinic, the patient reports doing reasonably well.  Unfortunately, he has gained his weight back.  This has been accompanied by worsened exercise tolerance.  Today, he denies symptoms of palpitations, chest pain, dizziness, presyncope, syncope, or neurologic sequela.  The patient feels that he is tolerating medications without difficulties and is otherwise without complaint today.   Past Medical History  Diagnosis Date  . Permanent atrial fibrillation     failed cardioversion in April of 2010  . Chronic anticoagulation   . Morbid obesity   . Osteoarthritis   . HTN (hypertension)   . 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 Urosepsis   . Prostate ca   . Nephrolithiasis   . Depression   . Gout   . Arthritis   . LV dysfunction     EF down to 30% per echo Feb 2013 -  started on ARB and low dose beta blocker   Past Surgical History  Procedure Laterality Date  . Pacemaker placement  2010    Medtronic  . Total knee arthroplasty    . Radioactive seed implant    . Shoulder surgery      Current Outpatient Prescriptions  Medication Sig Dispense Refill  . allopurinol (ZYLOPRIM) 300 MG tablet TAKE ONE TABLET BY MOUTH EVERY DAY  30 tablet  6  . carvedilol (COREG) 3.125 MG tablet TAKE ONE TABLET BY MOUTH TWICE DAILY  60 tablet  3  . celecoxib (CELEBREX) 200 MG capsule Take 200 mg by mouth every other day.        . clotrimazole-betamethasone (LOTRISONE) cream Apply 1 application topically as needed.       . hydrochlorothiazide (HYDRODIURIL) 25 MG tablet TAKE ONE TABLET BY MOUTH EVERY DAY  30 tablet  5  . losartan (COZAAR) 50 MG tablet Take 1 tablet (50 mg total) by mouth daily.  30 tablet  7  . warfarin (COUMADIN) 5 MG tablet Take 1 tablet (5 mg total) by mouth as directed.  150 tablet  1   No current facility-administered medications for this visit.    Allergies  Allergen Reactions   . Oxycodone   . Sertraline Hcl     History   Social History  . Marital Status: Widowed    Spouse Name: N/A    Number of Children: 1  . Years of Education: N/A   Occupational History  . Retired Administrator    Social History Main Topics  . Smoking status: Never Smoker   . Smokeless tobacco: Not on file  . Alcohol Use: No  . Drug Use: No  . Sexually Active: No   Other Topics Concern  . Not on file   Social History Narrative  . No narrative on file    Family History  Problem Relation Age of Onset  . Heart attack Father     Physical Exam: Filed Vitals:   04/20/13 1242  BP: 110/78  Pulse: 70  Height: 6' 0.5" (1.842 m)  Weight: 342 lb 3.2 oz (155.221 kg)    GEN- The patient is overweight appearing, alert and oriented x 3 today.   Head- normocephalic, atraumatic Eyes-  Sclera clear, conjunctiva pink Ears- hearing intact Oropharynx- clear Neck- supple, no JVP Lymph- no cervical lymphadenopathy Lungs- Clear to ausculation bilaterally, normal work of breathing Chest- pacemaker pocket is well healed Heart- Regular rate and rhythm (V paced) GI- soft, NT, ND, +  BS Extremities- no clubbing, cyanosis,+1 edema  Pacemaker interrogation- reviewed in detail today,  See PACEART report  Assessment and Plan:  1. Complete heart block Normal pacemaker function See Pace Art report No changes today  2. Chronic systolic dysfunction Stable No change required today  3. Permanent afib Continue long term anticoagulation  4. Obesity Weight loss is strongly advised

## 2013-05-13 ENCOUNTER — Other Ambulatory Visit: Payer: Self-pay | Admitting: Internal Medicine

## 2013-06-01 ENCOUNTER — Ambulatory Visit (INDEPENDENT_AMBULATORY_CARE_PROVIDER_SITE_OTHER): Payer: Medicare Other | Admitting: *Deleted

## 2013-06-01 DIAGNOSIS — I4891 Unspecified atrial fibrillation: Secondary | ICD-10-CM

## 2013-06-01 DIAGNOSIS — I4821 Permanent atrial fibrillation: Secondary | ICD-10-CM

## 2013-06-01 LAB — POCT INR: INR: 2.8

## 2013-06-28 ENCOUNTER — Other Ambulatory Visit: Payer: Self-pay | Admitting: Nurse Practitioner

## 2013-07-07 ENCOUNTER — Other Ambulatory Visit: Payer: Self-pay | Admitting: Internal Medicine

## 2013-07-13 ENCOUNTER — Ambulatory Visit (INDEPENDENT_AMBULATORY_CARE_PROVIDER_SITE_OTHER): Payer: Medicare Other | Admitting: *Deleted

## 2013-07-13 DIAGNOSIS — I4891 Unspecified atrial fibrillation: Secondary | ICD-10-CM

## 2013-07-13 DIAGNOSIS — I4821 Permanent atrial fibrillation: Secondary | ICD-10-CM

## 2013-07-13 LAB — POCT INR: INR: 3.1

## 2013-07-25 ENCOUNTER — Encounter: Payer: Medicare Other | Admitting: *Deleted

## 2013-08-02 ENCOUNTER — Encounter: Payer: Self-pay | Admitting: *Deleted

## 2013-08-08 ENCOUNTER — Other Ambulatory Visit: Payer: Self-pay | Admitting: *Deleted

## 2013-08-08 MED ORDER — LOSARTAN POTASSIUM 50 MG PO TABS: 50.0000 mg | ORAL_TABLET | Freq: Every day | ORAL | Status: DC

## 2013-08-09 ENCOUNTER — Ambulatory Visit (INDEPENDENT_AMBULATORY_CARE_PROVIDER_SITE_OTHER): Payer: Medicare Other | Admitting: *Deleted

## 2013-08-09 DIAGNOSIS — Z950 Presence of cardiac pacemaker: Secondary | ICD-10-CM

## 2013-08-09 DIAGNOSIS — I442 Atrioventricular block, complete: Secondary | ICD-10-CM

## 2013-08-13 LAB — REMOTE PACEMAKER DEVICE
BMOD-0002RV: 7
BMOD-0003RV: 30
BMOD-0004RV: 5
RV LEAD IMPEDENCE PM: 368 Ohm
VENTRICULAR PACING PM: 98.9

## 2013-08-16 ENCOUNTER — Encounter: Payer: Self-pay | Admitting: *Deleted

## 2013-08-21 ENCOUNTER — Other Ambulatory Visit: Payer: Self-pay | Admitting: Internal Medicine

## 2013-08-22 ENCOUNTER — Other Ambulatory Visit: Payer: Self-pay | Admitting: *Deleted

## 2013-08-22 MED ORDER — WARFARIN SODIUM 5 MG PO TABS: ORAL_TABLET | ORAL | Status: DC

## 2013-08-23 ENCOUNTER — Encounter: Payer: Self-pay | Admitting: Internal Medicine

## 2013-08-24 ENCOUNTER — Ambulatory Visit (INDEPENDENT_AMBULATORY_CARE_PROVIDER_SITE_OTHER): Payer: Medicare Other | Admitting: Nurse Practitioner

## 2013-08-24 ENCOUNTER — Ambulatory Visit (INDEPENDENT_AMBULATORY_CARE_PROVIDER_SITE_OTHER): Payer: Medicare Other | Admitting: *Deleted

## 2013-08-24 ENCOUNTER — Encounter (INDEPENDENT_AMBULATORY_CARE_PROVIDER_SITE_OTHER): Payer: Self-pay

## 2013-08-24 ENCOUNTER — Encounter: Payer: Self-pay | Admitting: Nurse Practitioner

## 2013-08-24 VITALS — BP 120/80 | HR 60 | Ht 72.0 in | Wt 348.8 lb

## 2013-08-24 DIAGNOSIS — I4821 Permanent atrial fibrillation: Secondary | ICD-10-CM

## 2013-08-24 DIAGNOSIS — R0602 Shortness of breath: Secondary | ICD-10-CM

## 2013-08-24 DIAGNOSIS — I4891 Unspecified atrial fibrillation: Secondary | ICD-10-CM

## 2013-08-24 DIAGNOSIS — R0600 Dyspnea, unspecified: Secondary | ICD-10-CM

## 2013-08-24 LAB — CBC WITH DIFFERENTIAL/PLATELET
Basophils Absolute: 0 10*3/uL (ref 0.0–0.1)
Basophils Relative: 0.4 % (ref 0.0–3.0)
Eosinophils Absolute: 0.2 10*3/uL (ref 0.0–0.7)
Eosinophils Relative: 2.7 % (ref 0.0–5.0)
HCT: 41.8 % (ref 39.0–52.0)
Hemoglobin: 14.2 g/dL (ref 13.0–17.0)
Lymphocytes Relative: 24.7 % (ref 12.0–46.0)
Lymphs Abs: 1.6 10*3/uL (ref 0.7–4.0)
MCHC: 33.9 g/dL (ref 30.0–36.0)
MCV: 90 fl (ref 78.0–100.0)
Monocytes Absolute: 0.5 10*3/uL (ref 0.1–1.0)
Monocytes Relative: 8.1 % (ref 3.0–12.0)
Neutro Abs: 4.1 10*3/uL (ref 1.4–7.7)
Neutrophils Relative %: 64.1 % (ref 43.0–77.0)
Platelets: 185 10*3/uL (ref 150.0–400.0)
RBC: 4.64 Mil/uL (ref 4.22–5.81)
RDW: 14.6 % (ref 11.5–14.6)
WBC: 6.4 10*3/uL (ref 4.5–10.5)

## 2013-08-24 LAB — BASIC METABOLIC PANEL
BUN: 22 mg/dL (ref 6–23)
CO2: 29 mEq/L (ref 19–32)
Calcium: 9.4 mg/dL (ref 8.4–10.5)
Chloride: 102 mEq/L (ref 96–112)
Creatinine, Ser: 1.1 mg/dL (ref 0.4–1.5)
GFR: 72.37 mL/min (ref >60.00)
Glucose, Bld: 150 mg/dL — ABNORMAL HIGH (ref 70–99)
Potassium: 3.6 mEq/L (ref 3.5–5.1)
Sodium: 140 mEq/L (ref 135–145)

## 2013-08-24 LAB — POCT INR: INR: 4.1

## 2013-08-24 LAB — BRAIN NATRIURETIC PEPTIDE: Pro B Natriuretic peptide (BNP): 61 pg/mL (ref 0.0–100.0)

## 2013-08-24 NOTE — Patient Instructions (Signed)
We are checking labs today   I will see you in 3 months  Stay on your current medicines  Think about what we talked about!!  Call the [place] office at [phone] if you have any questions, problems or concerns.

## 2013-08-24 NOTE — Progress Notes (Signed)
[name] Date of Birth: 01/20/1941 Medical Record [id number]  History of Present Illness: Boysie is seen back today for a follow up visit. Seen for [name]. He has HTN, morbid obesity, chronic atrial fib on coumadin and has a pacemaker in place. EF 35% per echo from March of 2013. He has been very reluctant to take more medicines and has not wanted a lot done for him.   Seen by me back in April of 2014. Saw [name] for his device check in June of 2014. He has gained all of his weight back.   Comes back today. Here with his significant other. Remains off track with his diet - drinking way too much Dr. [name] again. Weight is up. He has had a URI - slow to recover - some associated dizziness. Bruises easily. More short of breath. More swelling in his legs/feet. Using too much salt and he readily admits this.    Current Outpatient Prescriptions  Medication Sig Dispense Refill  . allopurinol (ZYLOPRIM) 300 MG tablet TAKE ONE TABLET BY MOUTH EVERY DAY  30 tablet  6  . carvedilol (COREG) 3.125 MG tablet TAKE ONE TABLET BY MOUTH TWICE DAILY  60 tablet  6  . celecoxib (CELEBREX) 200 MG capsule Take 200 mg by mouth every other day.        . clotrimazole-betamethasone (LOTRISONE) cream Apply 1 application topically as needed.       . hydrochlorothiazide (HYDRODIURIL) 25 MG tablet TAKE ONE TABLET BY MOUTH ONCE DAILY  90 tablet  3  . losartan (COZAAR) 50 MG tablet Take 1 tablet (50 mg total) by mouth daily.  30 tablet  6  . warfarin (COUMADIN) 5 MG tablet Take as directed by coumadin clinic  150 tablet  1   No current facility-administered medications for this visit.    Allergies  Allergen Reactions  . Oxycodone   . Sertraline Hcl     Past Medical History  Diagnosis Date  . Permanent atrial fibrillation     failed cardioversion in April of 2010  . Chronic anticoagulation   . Morbid obesity   . Osteoarthritis   . HTN (hypertension)   . 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 Urosepsis   .  Prostate ca   . Nephrolithiasis   . Depression   . Gout   . Arthritis   . LV dysfunction     EF down to 30% per echo Feb 2013 -  started on ARB and low dose beta blocker    Past Surgical History  Procedure Laterality Date  . Pacemaker placement  2010    Medtronic  . Total knee arthroplasty    . Radioactive seed implant    . Shoulder surgery      History  Smoking status  . Never Smoker   Smokeless tobacco  . Not on file    History  Alcohol Use No    Family History  Problem Relation Age of Onset  . Heart attack Father     Review of Systems: The review of systems is per the HPI.  All other systems were reviewed and are negative.  Physical Exam: BP 120/80  Pulse 60  Ht 6' (1.829 m)  Wt 348 lb 12.8 oz (158.215 kg)  BMI 47.3 kg/m2 Patient is very pleasant and in no acute distress. Morbidly obese. Skin is warm and dry. Color is normal.  HEENT is unremarkable. Normocephalic/atraumatic. PERRL. Sclera are nonicteric. Neck is supple. No masses. No JVD.  Lungs are clear. Cardiac exam shows an irregular rhythm. Rate is ok. Abdomen is soft. Extremities are with 1+edema. Gait and ROM are intact. No gross neurologic deficits noted.  LABORATORY DATA: PENDING    Chemistry      Component Value Date/Time   NA 136 03/08/2013 1421   K 3.6 03/08/2013 1421   CL 98 03/08/2013 1421   CO2 32 03/08/2013 1421   BUN 20 03/08/2013 1421   CREATININE 1.1 03/08/2013 1421   CREATININE 1.11 02/10/2011 1613      Component Value Date/Time   CALCIUM 9.0 03/08/2013 1421   ALKPHOS 69 11/03/2008 0913   AST 19 11/03/2008 0913   ALT 21 11/03/2008 0913   BILITOT 0.8 11/03/2008 0913     Lab Results  Component Value Date   WBC 6.3 03/08/2013   HGB 15.1 03/08/2013   HCT 44.7 03/08/2013   MCV 89.2 03/08/2013   PLT 178.0 03/08/2013   Lab Results  Component Value Date   CHOL  Value: 126        ATP III CLASSIFICATION:  <200     mg/dL   Desirable  200-239  mg/dL   Borderline High  >=240    mg/dL   High  09/15/2008   HDL 11* 09/15/2008   LDLCALC  Value: 79        Total Cholesterol/HDL:CHD Risk Coronary Heart Disease Risk Table                     Men   Women  1/2 Average Risk   3.4   3.3 09/15/2008   TRIG 181* 09/15/2008   CHOLHDL 11.5 09/15/2008    Lab Results  Component Value Date   INR 4.1 08/24/2013   INR 3.1 07/13/2013   INR 2.8 06/01/2013     Assessment / Plan: 1. Chronic systolic HF - tried to get another echo - he does not want to do- he is agreeable to letting me check some labs. Needs to really cut back on the salt and get back on track with his diet.   2. Chronic atrial fib - rate is ok.   3. Chronic coumadin - INR high today - was adjusted in clinic.  4. Morbid obesity - addressed today.   Patient is agreeable to this plan and will call if any problems develop in the interim.   Burtis Junes, RN, [facility] [address] [place], [place]  [phone]

## 2013-09-06 ENCOUNTER — Ambulatory Visit: Payer: Medicare Other | Admitting: Nurse Practitioner

## 2013-09-07 ENCOUNTER — Ambulatory Visit (INDEPENDENT_AMBULATORY_CARE_PROVIDER_SITE_OTHER): Payer: Medicare Other | Admitting: *Deleted

## 2013-09-07 DIAGNOSIS — I4821 Permanent atrial fibrillation: Secondary | ICD-10-CM

## 2013-09-07 DIAGNOSIS — I4891 Unspecified atrial fibrillation: Secondary | ICD-10-CM

## 2013-09-07 LAB — POCT INR: INR: 3

## 2013-09-28 ENCOUNTER — Ambulatory Visit (INDEPENDENT_AMBULATORY_CARE_PROVIDER_SITE_OTHER): Payer: Medicare Other | Admitting: Pharmacist

## 2013-09-28 DIAGNOSIS — I4891 Unspecified atrial fibrillation: Secondary | ICD-10-CM

## 2013-09-28 DIAGNOSIS — I4821 Permanent atrial fibrillation: Secondary | ICD-10-CM

## 2013-10-11 ENCOUNTER — Other Ambulatory Visit: Payer: Self-pay

## 2013-10-11 MED ORDER — ALLOPURINOL 300 MG PO TABS: ORAL_TABLET | ORAL | Status: DC

## 2013-10-31 ENCOUNTER — Ambulatory Visit: Payer: Self-pay | Admitting: Pharmacist

## 2013-10-31 DIAGNOSIS — I4821 Permanent atrial fibrillation: Secondary | ICD-10-CM

## 2013-11-14 ENCOUNTER — Ambulatory Visit (INDEPENDENT_AMBULATORY_CARE_PROVIDER_SITE_OTHER): Payer: Medicare Other | Admitting: *Deleted

## 2013-11-14 DIAGNOSIS — I442 Atrioventricular block, complete: Secondary | ICD-10-CM

## 2013-11-14 LAB — MDC_IDC_ENUM_SESS_TYPE_REMOTE
Battery Voltage: 2.92 V
Brady Statistic RV Percent Paced: 98.66 %
Lead Channel Impedance Value: 424 Ohm
Lead Channel Impedance Value: 432 Ohm
Lead Channel Setting Pacing Amplitude: 2.5 V
Lead Channel Setting Pacing Pulse Width: 0.4 ms
Lead Channel Setting Sensing Sensitivity: 0.9 mV
MDC IDC SESS DTM: 20141229161959
MDC IDC SET ZONE DETECTION INTERVAL: 400 ms
Zone Setting Detection Interval: 350 ms

## 2013-11-23 ENCOUNTER — Encounter: Payer: Self-pay | Admitting: Nurse Practitioner

## 2013-11-23 ENCOUNTER — Ambulatory Visit (INDEPENDENT_AMBULATORY_CARE_PROVIDER_SITE_OTHER): Payer: Medicare Other | Admitting: Nurse Practitioner

## 2013-11-23 VITALS — BP 110/70 | HR 78 | Ht 72.0 in | Wt 342.8 lb

## 2013-11-23 DIAGNOSIS — I5022 Chronic systolic (congestive) heart failure: Secondary | ICD-10-CM

## 2013-11-23 NOTE — Progress Notes (Signed)
[name] Date of Birth: 11/10/1941 Medical Record [id number]  History of Present Illness: [name] is seen back today for a 3 month check. Seen for [name]. He has HTN, morbid obesity, chronic atrial fib on coumadin and has a pacemaker in place. EF 35% per echo from March of 2013. He has been very reluctant to take more medicines and has not wanted a lot [name] for him.   Seen by me back in April of 2014 - was losing weight. Saw [name] for his device check in June of 2014. He had gained all of his weight back. I saw him back in October - remained  way off track with his diet - drinking way too much Dr. [name] again. Weight was up. I tried to get another echo but he did not wish to proceed.   Comes back today. Here with his significant other. Doing ok. Feels ok. Trying to cut back on his food/drink. Weight is down a few pounds. No chest pain. Breathing is stable. He is ok with how he is doing. Now getting his INR with Dr. Virgina Jock due to cost.    Current Outpatient Prescriptions  Medication Sig Dispense Refill  . allopurinol (ZYLOPRIM) 300 MG tablet TAKE ONE TABLET BY MOUTH EVERY DAY  30 tablet  6  . carvedilol (COREG) 3.125 MG tablet TAKE ONE TABLET BY MOUTH TWICE DAILY  60 tablet  6  . celecoxib (CELEBREX) 200 MG capsule Take 200 mg by mouth every other day.        . clotrimazole-betamethasone (LOTRISONE) cream Apply 1 application topically as needed.       . hydrochlorothiazide (HYDRODIURIL) 25 MG tablet TAKE ONE TABLET BY MOUTH ONCE DAILY  90 tablet  3  . losartan (COZAAR) 50 MG tablet Take 1 tablet (50 mg total) by mouth daily.  30 tablet  6  . warfarin (COUMADIN) 5 MG tablet Take as directed by coumadin clinic  150 tablet  1   No current facility-administered medications for this visit.    Allergies  Allergen Reactions  . Oxycodone   . Sertraline Hcl     Past Medical History  Diagnosis Date  . Permanent atrial fibrillation     failed cardioversion in April of 2010    . Chronic anticoagulation   . Morbid obesity   . Osteoarthritis   . HTN (hypertension)   . 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 Urosepsis   . Prostate ca   . Nephrolithiasis   . Depression   . Gout   . Arthritis   . LV dysfunction     EF down to 30% per echo Feb 2013 -  started on ARB and low dose beta blocker    Past Surgical History  Procedure Laterality Date  . Pacemaker placement  2010    Medtronic  . Total knee arthroplasty    . Radioactive seed implant    . Shoulder surgery      History  Smoking status  . Never Smoker   Smokeless tobacco  . Not on file    History  Alcohol Use No    Family History  Problem Relation Age of Onset  . Heart attack Father     Review of Systems: The review of systems is per the HPI.  All other systems were reviewed and are negative.  Physical Exam: BP 110/70  Pulse 78  Ht 6' (1.829 m)  Wt 342 lb 12.8 oz (155.493 kg)  BMI 46.48 kg/m2  SpO2 97% Patient is very pleasant and in no acute distress. Remains morbidly obese - but has lost 6 pounds. Skin is warm and dry. Color is normal.  HEENT is unremarkable. Normocephalic/atraumatic. PERRL. Sclera are nonicteric. Neck is supple. No masses. No JVD. Lungs are clear. Cardiac exam shows an irregular rhythm. Rate is ok. Abdomen is soft. Extremities are quite full - which is chronic. Gait and ROM are intact. No gross neurologic deficits noted.  Wt Readings from Last 3 Encounters:  11/23/13 342 lb 12.8 oz (155.493 kg)  08/24/13 348 lb 12.8 oz (158.215 kg)  04/20/13 342 lb 3.2 oz (155.221 kg)     LABORATORY DATA:    Chemistry      Component Value Date/Time   NA 140 08/24/2013 1505   K 3.6 08/24/2013 1505   CL 102 08/24/2013 1505   CO2 29 08/24/2013 1505   BUN 22 08/24/2013 1505   CREATININE 1.1 08/24/2013 1505   CREATININE 1.11 02/10/2011 1613      Component Value Date/Time   CALCIUM 9.4 08/24/2013 1505   ALKPHOS 69 11/03/2008 0913   AST 19 11/03/2008 0913   ALT 21 11/03/2008 0913    BILITOT 0.8 11/03/2008 0913     Lab Results  Component Value Date   WBC 6.4 08/24/2013   HGB 14.2 08/24/2013   HCT 41.8 08/24/2013   MCV 90.0 08/24/2013   PLT 185.0 08/24/2013   Lab Results  Component Value Date   CHOL  Value: 126        ATP III CLASSIFICATION:  <200     mg/dL   Desirable  200-239  mg/dL   Borderline High  >=240    mg/dL   High 09/15/2008   HDL 11* 09/15/2008   LDLCALC  Value: 79        Total Cholesterol/HDL:CHD Risk Coronary Heart Disease Risk Table                     Men   Women  1/2 Average Risk   3.4   3.3 09/15/2008   TRIG 181* 09/15/2008   CHOLHDL 11.5 09/15/2008   Lab Results  Component Value Date   INR 3.6 09/28/2013   INR 3.0 09/07/2013   INR 4.1 08/24/2013     Assessment / Plan:  1. Chronic systolic HF - seems stable - he is not interested in further testing or changes in medicines. Seems to be holding his own.   2. Chronic atrial fib - managed with rate control and coumadin.   3. Morbid obesity - still the crux of his issues.   Will see him back with a pacemaker check in about 6 months. No changes in medicines.   Patient is agreeable to this plan and will call if any problems develop in the interim.   Burtis Junes, RN, [facility] [address] [place], [place]  [address] [phone]

## 2013-11-23 NOTE — Patient Instructions (Addendum)
Stay on your current medicines  I will see you in 6 months  Try to stay on track with everything  Call the [place] office at [phone] if you have any questions, problems or concerns.

## 2013-11-29 ENCOUNTER — Encounter: Payer: Self-pay | Admitting: *Deleted

## 2013-12-01 ENCOUNTER — Encounter: Payer: Self-pay | Admitting: Internal Medicine

## 2014-01-20 ENCOUNTER — Other Ambulatory Visit: Payer: Self-pay

## 2014-01-20 MED ORDER — CARVEDILOL 3.125 MG PO TABS: ORAL_TABLET | ORAL | Status: DC

## 2014-02-15 ENCOUNTER — Encounter: Payer: Self-pay | Admitting: Internal Medicine

## 2014-02-15 ENCOUNTER — Ambulatory Visit (INDEPENDENT_AMBULATORY_CARE_PROVIDER_SITE_OTHER): Payer: Medicare Other | Admitting: *Deleted

## 2014-02-15 DIAGNOSIS — Z950 Presence of cardiac pacemaker: Secondary | ICD-10-CM

## 2014-02-15 DIAGNOSIS — I4891 Unspecified atrial fibrillation: Secondary | ICD-10-CM

## 2014-02-15 DIAGNOSIS — I4821 Permanent atrial fibrillation: Secondary | ICD-10-CM

## 2014-02-19 LAB — MDC_IDC_ENUM_SESS_TYPE_REMOTE
Battery Voltage: 2.9 V
Brady Statistic RV Percent Paced: 99.39 %
Date Time Interrogation Session: 20150401144134
Lead Channel Impedance Value: 424 Ohm
Lead Channel Setting Pacing Amplitude: 2.5 V
MDC IDC MSMT LEADCHNL RV IMPEDANCE VALUE: 408 Ohm
MDC IDC SET LEADCHNL RV PACING PULSEWIDTH: 0.4 ms
MDC IDC SET LEADCHNL RV SENSING SENSITIVITY: 0.9 mV
Zone Setting Detection Interval: 350 ms
Zone Setting Detection Interval: 400 ms

## 2014-02-24 ENCOUNTER — Encounter: Payer: Self-pay | Admitting: *Deleted

## 2014-03-01 ENCOUNTER — Other Ambulatory Visit: Payer: Self-pay | Admitting: Internal Medicine

## 2014-03-12 ENCOUNTER — Other Ambulatory Visit: Payer: Self-pay | Admitting: Internal Medicine

## 2014-03-20 ENCOUNTER — Ambulatory Visit (INDEPENDENT_AMBULATORY_CARE_PROVIDER_SITE_OTHER): Payer: Medicare Other | Admitting: *Deleted

## 2014-03-21 LAB — MDC_IDC_ENUM_SESS_TYPE_REMOTE
Battery Voltage: 2.9 V
Brady Statistic RV Percent Paced: 98.78 %
Lead Channel Impedance Value: 376 Ohm
Lead Channel Impedance Value: 392 Ohm
Lead Channel Setting Pacing Pulse Width: 0.4 ms
Lead Channel Setting Sensing Sensitivity: 0.9 mV
MDC IDC SESS DTM: 20150505152301
MDC IDC SET LEADCHNL RV PACING AMPLITUDE: 2.5 V
MDC IDC SET ZONE DETECTION INTERVAL: 350 ms
MDC IDC SET ZONE DETECTION INTERVAL: 400 ms

## 2014-03-31 NOTE — Addendum Note (Signed)
Addended by: Tiajuana Amass on: 03/31/2014 11:14 AM   Modules accepted: Level of Service

## 2014-04-06 ENCOUNTER — Encounter: Payer: Self-pay | Admitting: Cardiology

## 2014-04-13 ENCOUNTER — Encounter: Payer: Self-pay | Admitting: Internal Medicine

## 2014-05-16 ENCOUNTER — Other Ambulatory Visit: Payer: Self-pay | Admitting: Internal Medicine

## 2014-05-25 ENCOUNTER — Ambulatory Visit: Payer: Medicare Other | Admitting: Nurse Practitioner

## 2014-05-29 ENCOUNTER — Other Ambulatory Visit: Payer: Self-pay | Admitting: Internal Medicine

## 2014-06-19 ENCOUNTER — Other Ambulatory Visit: Payer: Self-pay | Admitting: Internal Medicine

## 2014-06-21 ENCOUNTER — Ambulatory Visit (INDEPENDENT_AMBULATORY_CARE_PROVIDER_SITE_OTHER): Payer: Medicare Other | Admitting: *Deleted

## 2014-06-21 ENCOUNTER — Encounter: Payer: Self-pay | Admitting: Nurse Practitioner

## 2014-06-21 ENCOUNTER — Ambulatory Visit (INDEPENDENT_AMBULATORY_CARE_PROVIDER_SITE_OTHER): Payer: Medicare Other | Admitting: Nurse Practitioner

## 2014-06-21 VITALS — BP 120/80 | HR 75 | Ht 72.0 in | Wt 337.8 lb

## 2014-06-21 DIAGNOSIS — I442 Atrioventricular block, complete: Secondary | ICD-10-CM

## 2014-06-21 DIAGNOSIS — I4891 Unspecified atrial fibrillation: Secondary | ICD-10-CM

## 2014-06-21 DIAGNOSIS — I4821 Permanent atrial fibrillation: Secondary | ICD-10-CM

## 2014-06-21 DIAGNOSIS — Z950 Presence of cardiac pacemaker: Secondary | ICD-10-CM

## 2014-06-21 DIAGNOSIS — I5022 Chronic systolic (congestive) heart failure: Secondary | ICD-10-CM

## 2014-06-21 LAB — MDC_IDC_ENUM_SESS_TYPE_INCLINIC
Brady Statistic RV Percent Paced: 98.91 %
Date Time Interrogation Session: 20150805115309
Lead Channel Impedance Value: 384 Ohm
Lead Channel Pacing Threshold Amplitude: 1 V
Lead Channel Pacing Threshold Pulse Width: 0.4 ms
Lead Channel Sensing Intrinsic Amplitude: 0.2619
Lead Channel Setting Pacing Amplitude: 2.5 V
Lead Channel Setting Sensing Sensitivity: 0.9 mV
MDC IDC MSMT BATTERY VOLTAGE: 2.88 V
MDC IDC MSMT LEADCHNL RV IMPEDANCE VALUE: 416 Ohm
MDC IDC SET LEADCHNL RV PACING PULSEWIDTH: 0.4 ms
MDC IDC SET ZONE DETECTION INTERVAL: 350 ms
Zone Setting Detection Interval: 400 ms

## 2014-06-21 NOTE — Patient Instructions (Addendum)
I think you are doing ok  Keep trying to cut back on your soda  We will get your pacemaker checked  I will see you in 6 months with a pacemaker check  Call the [place] office at [phone] if you have any questions, problems or concerns.

## 2014-06-21 NOTE — Patient Instructions (Signed)
Pacemaker check in clinic. Normal device function. Threshold and impedances consistent with previous measurements. Device programmed to maximize longevity. Permanent AF + Warfarin. No high ventricular rates noted. Device programmed at appropriate safety margins. Histogram distribution appropriate for patient activity level. Device programmed to optimize intrinsic conduction. Batt voltage 2.88V (ERI 2.81V)---accelerated F/U encouraged after next remote check. Patient will follow up via [facility] on 11-5 and with [employer]/Device in 6 months.

## 2014-06-21 NOTE — Progress Notes (Signed)
Pacemaker check in clinic. Normal device function. Threshold and impedances consistent with previous measurements. Device programmed to maximize longevity. Permanent AF + Warfarin. No high ventricular rates noted. Device programmed at appropriate safety margins. Histogram distribution appropriate for patient activity level. Device programmed to optimize intrinsic conduction. Batt voltage 2.88V (ERI 2.81V)---accelerated F/U encouraged after next remote check. Patient will follow up via [facility] on 11-5 and with [facility]/Device in 6 months.

## 2014-06-21 NOTE — Progress Notes (Signed)
[name] Date of Birth: 04-28-1941 Medical Record [id number]  History of Present Illness: Mr. [name] is seen back today for a 6 month check. Seen for [name]. He has HTN, morbid obesity, chronic atrial fib on coumadin and has a pacemaker in place. EF 35% per echo from March of 2013. He has been very reluctant to take more medicines and has not wanted a lot done for him. Dr. Virgina Jock checks his coumadin.   Seen by me back in April of 2014 - was losing weight. Saw [name] for his device check in June of 2014. He had gained all of his weight back. I saw him back in October - remained way off track with his diet - drinking way too much Dr. [name] again. Weight was up. I tried to get another echo but he did not wish to proceed.   Last seen back in January. He was stable. Still not interested in further testing or change in medicines.   Comes back today. Here with his significant other. Doing ok. Feels ok. Has cut back some on his Dr. [name]. Weight down 5 pounds. Breathing labored with the heat we are having but otherwise he is at his baseline. No chest pain. Not dizzy. Swelling under control. Still not interested in further testing or more medicines.   Current Outpatient Prescriptions  Medication Sig Dispense Refill  . allopurinol (ZYLOPRIM) 300 MG tablet TAKE ONE TABLET BY MOUTH ONCE DAILY  30 tablet  0  . carvedilol (COREG) 3.125 MG tablet TAKE ONE TABLET BY MOUTH TWICE DAILY  60 tablet  10  . celecoxib (CELEBREX) 200 MG capsule Take 200 mg by mouth every other day.        . clotrimazole-betamethasone (LOTRISONE) cream Apply 1 application topically as needed.       . hydrochlorothiazide (HYDRODIURIL) 25 MG tablet TAKE ONE TABLET BY MOUTH ONCE DAILY  90 tablet  3  . losartan (COZAAR) 50 MG tablet TAKE ONE TABLET BY MOUTH ONCE DAILY  30 tablet  0  . pravastatin (PRAVACHOL) 20 MG tablet Take 20 mg by mouth daily.       Marland Kitchen warfarin (COUMADIN) 5 MG tablet Take as directed by coumadin clinic   150 tablet  1   No current facility-administered medications for this visit.    Allergies  Allergen Reactions  . Oxycodone   . Sertraline Hcl     Past Medical History  Diagnosis Date  . Permanent atrial fibrillation     failed cardioversion in April of 2010  . Chronic anticoagulation   . Morbid obesity   . Osteoarthritis   . HTN (hypertension)   . 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 Urosepsis   . Prostate ca   . Nephrolithiasis   . Depression   . Gout   . Arthritis   . LV dysfunction     EF down to 30% per echo Feb 2013 -  started on ARB and low dose beta blocker    Past Surgical History  Procedure Laterality Date  . Pacemaker placement  2010    Medtronic  . Total knee arthroplasty    . Radioactive seed implant    . Shoulder surgery      History  Smoking status  . Never Smoker   Smokeless tobacco  . Not on file    History  Alcohol Use No    Family History  Problem Relation Age of Onset  . Heart attack Father     Review  of Systems: The review of systems is per the HPI.  All other systems were reviewed and are negative.  Physical Exam: BP 120/80  Pulse 75  Ht 6' (1.829 m)  Wt 337 lb 12.8 oz (153.225 kg)  BMI 45.80 kg/m2  SpO2 93% Patient is very pleasant and in no acute distress. Weight is down 5 pounds. Remains obese. Skin is warm and dry. Color is normal.  HEENT is unremarkable. Normocephalic/atraumatic. PERRL. Sclera are nonicteric. Neck is supple. No masses. No JVD. Lungs are clear. Cardiac exam shows an irregular rhythm. Rate ok. Abdomen is soft. Extremities are without edema. Gait and ROM are intact. No gross neurologic deficits noted.  Wt Readings from Last 3 Encounters:  06/21/14 337 lb 12.8 oz (153.225 kg)  11/23/13 342 lb 12.8 oz (155.493 kg)  08/24/13 348 lb 12.8 oz (158.215 kg)    LABORATORY DATA/PROCEDURES:  Lab Results  Component Value Date   WBC 6.4 08/24/2013   HGB 14.2 08/24/2013   HCT 41.8 08/24/2013   PLT 185.0 08/24/2013   GLUCOSE  150* 08/24/2013   CHOL  Value: 126        ATP III CLASSIFICATION:  <200     mg/dL   Desirable  200-239  mg/dL   Borderline High  >=240    mg/dL   High 09/15/2008   TRIG 181* 09/15/2008   HDL 11* 09/15/2008   LDLCALC  Value: 79        Total Cholesterol/HDL:CHD Risk Coronary Heart Disease Risk Table                     Men   Women  1/2 Average Risk   3.4   3.3 09/15/2008   ALT 21 11/03/2008   AST 19 11/03/2008   NA 140 08/24/2013   K 3.6 08/24/2013   CL 102 08/24/2013   CREATININE 1.1 08/24/2013   BUN 22 08/24/2013   CO2 29 08/24/2013   TSH 1.487 Test methodology is 3rd generation TSH 09/15/2008   INR 3.6 09/28/2013   HGBA1C  Value: 6.3 (NOTE)   The ADA recommends the following therapeutic goal for glycemic   control related to Hgb A1C measurement:   Goal of Therapy:   < 7.0% Hgb A1C   Reference: American Diabetes Association: Clinical Practice   Recommendations 2008, Diabetes Care,  2008, 31:(Suppl 1).* 09/15/2008    BNP (last 3 results)  Recent Labs  08/24/13 1505  PROBNP 61.0     Assessment / Plan: 1. Chronic systolic HF - seems stable - he is not interested in further testing or changes in medicines. Seems to be holding his own. Will see again in 6 months.  2. Chronic atrial fib - managed with rate control and coumadin.   3. Morbid obesity - still the crux of his issues. He has lost 5 pounds - tried to be encouraging.   4. Underlying PPM - checking device today.   See back in 6 months with a device check. No changes in medicines.   Patient is agreeable to this plan and will call if any problems develop in the interim.   Burtis Junes, RN, [facility] [address] [place], [place]  [address] [phone]

## 2014-06-26 ENCOUNTER — Other Ambulatory Visit: Payer: Self-pay | Admitting: Cardiovascular Disease

## 2014-07-03 ENCOUNTER — Encounter: Payer: Self-pay | Admitting: Internal Medicine

## 2014-07-09 ENCOUNTER — Other Ambulatory Visit: Payer: Self-pay | Admitting: Internal Medicine

## 2014-07-20 ENCOUNTER — Other Ambulatory Visit: Payer: Self-pay | Admitting: Internal Medicine

## 2014-07-26 ENCOUNTER — Other Ambulatory Visit: Payer: Self-pay

## 2014-07-26 MED ORDER — LOSARTAN POTASSIUM 50 MG PO TABS: ORAL_TABLET | ORAL | Status: DC

## 2014-09-21 ENCOUNTER — Ambulatory Visit (INDEPENDENT_AMBULATORY_CARE_PROVIDER_SITE_OTHER): Payer: Medicare Other | Admitting: *Deleted

## 2014-09-21 DIAGNOSIS — I495 Sick sinus syndrome: Secondary | ICD-10-CM

## 2014-09-21 LAB — MDC_IDC_ENUM_SESS_TYPE_REMOTE
Lead Channel Impedance Value: 384 Ohm
Lead Channel Impedance Value: 448 Ohm
Lead Channel Setting Pacing Amplitude: 2.5 V
Lead Channel Setting Pacing Pulse Width: 0.4 ms
Lead Channel Setting Sensing Sensitivity: 0.9 mV
MDC IDC MSMT BATTERY VOLTAGE: 2.86 V
MDC IDC SESS DTM: 20151105173608
MDC IDC SET ZONE DETECTION INTERVAL: 350 ms
MDC IDC SET ZONE DETECTION INTERVAL: 400 ms
MDC IDC STAT BRADY RV PERCENT PACED: 99.8 %

## 2014-09-21 NOTE — Progress Notes (Signed)
Remote pacemaker transmission.   

## 2014-10-05 ENCOUNTER — Encounter: Payer: Self-pay | Admitting: Cardiology

## 2014-10-14 ENCOUNTER — Other Ambulatory Visit: Payer: Self-pay | Admitting: Internal Medicine

## 2014-10-16 ENCOUNTER — Encounter: Payer: Self-pay | Admitting: Internal Medicine

## 2014-11-22 ENCOUNTER — Other Ambulatory Visit: Payer: Self-pay | Admitting: Internal Medicine

## 2014-11-24 ENCOUNTER — Other Ambulatory Visit: Payer: Self-pay | Admitting: Cardiovascular Disease

## 2014-12-22 ENCOUNTER — Other Ambulatory Visit: Payer: Self-pay | Admitting: Internal Medicine

## 2014-12-26 ENCOUNTER — Ambulatory Visit: Payer: Medicare Other | Admitting: Nurse Practitioner

## 2014-12-27 ENCOUNTER — Encounter: Payer: Self-pay | Admitting: Nurse Practitioner

## 2014-12-27 ENCOUNTER — Ambulatory Visit (INDEPENDENT_AMBULATORY_CARE_PROVIDER_SITE_OTHER): Payer: 59 | Admitting: Nurse Practitioner

## 2014-12-27 ENCOUNTER — Ambulatory Visit (INDEPENDENT_AMBULATORY_CARE_PROVIDER_SITE_OTHER): Payer: 59 | Admitting: *Deleted

## 2014-12-27 VITALS — BP 130/80 | HR 73 | Ht 71.0 in | Wt 338.8 lb

## 2014-12-27 DIAGNOSIS — I442 Atrioventricular block, complete: Secondary | ICD-10-CM

## 2014-12-27 DIAGNOSIS — Z950 Presence of cardiac pacemaker: Secondary | ICD-10-CM

## 2014-12-27 DIAGNOSIS — I482 Chronic atrial fibrillation: Secondary | ICD-10-CM

## 2014-12-27 DIAGNOSIS — I4821 Permanent atrial fibrillation: Secondary | ICD-10-CM

## 2014-12-27 LAB — MDC_IDC_ENUM_SESS_TYPE_INCLINIC
Battery Voltage: 2.85 V
Date Time Interrogation Session: 20160210115131
Lead Channel Impedance Value: 360 Ohm
Lead Channel Pacing Threshold Amplitude: 1 V
Lead Channel Pacing Threshold Pulse Width: 0.4 ms
Lead Channel Sensing Intrinsic Amplitude: 0.3928
Lead Channel Setting Pacing Amplitude: 2.5 V
Lead Channel Setting Pacing Pulse Width: 0.4 ms
MDC IDC MSMT LEADCHNL RA IMPEDANCE VALUE: 368 Ohm
MDC IDC MSMT LEADCHNL RV SENSING INTR AMPL: 12.7923
MDC IDC SET LEADCHNL RV SENSING SENSITIVITY: 0.9 mV
MDC IDC SET ZONE DETECTION INTERVAL: 400 ms
MDC IDC STAT BRADY RV PERCENT PACED: 99.79 %
Zone Setting Detection Interval: 350 ms

## 2014-12-27 MED ORDER — LOSARTAN POTASSIUM 50 MG PO TABS: ORAL_TABLET | ORAL | Status: DC

## 2014-12-27 NOTE — Progress Notes (Signed)
Pacemaker check in clinic w/Lori Gerhardt. Normal device function. Threshold, sensing, and impedances consistent with previous measurements. Device programmed to maximize longevity. Permanent AF + Warfarin. No high ventricular rates noted. Device programmed at appropriate safety margins. Histogram distribution appropriate for patient activity level. Device programmed to optimize intrinsic conduction. Batt voltage 2.85V (ERI 2.81V). Patient will follow up via [facility] on 3-14 (batt only/next billable 03/2015) and with [facility] in 6 months.

## 2014-12-27 NOTE — Patient Instructions (Addendum)
Stay on your current medicines  Try to take the Coreg twice a day as prescribed - it will work better for you  I have refilled the Losartan today  See me in 6 months with a device check  Call the [place] office at [phone] if you have any questions, problems or concern  Remote monitoring is used to monitor your pacemakerfrom home. This monitoring reduces the number of office visits required to check your device to one time per year. It allows [place] to keep an eye on the functioning of your device to ensure it is working properly. You are scheduled for a device check from home on 01-29-2015. You may send your transmission at any time that day. If you have a wireless device, the transmission will be sent automatically. After your physician reviews your transmission, you will receive a postcard with your next transmission date.

## 2014-12-27 NOTE — Patient Instructions (Signed)
Pacemaker check in clinic w/Lori Gerhardt. Normal device function. Threshold, sensing, and impedances consistent with previous measurements. Device programmed to maximize longevity. Permanent AF + Warfarin. No high ventricular rates noted. Device programmed at appropriate safety margins. Histogram distribution appropriate for patient activity level. Device programmed to optimize intrinsic conduction. Batt voltage 2.85V (ERI 2.81V). Patient will follow up via [facility] on 3-14 (batt only/next billable 03/2015) and with [facility] in 6 months.

## 2014-12-27 NOTE — Progress Notes (Signed)
CARDIOLOGY OFFICE NOTE  Date:  12/27/2014    [name] Date of Birth: 11-Apr-1941 Medical Record [id number]  PCP:  [name], MD  Cardiologist:  Allred  Chief Complaint  Patient presents with  . Atrial Fibrillation    6 month check - seen for [name].  . Congestive Heart Failure     History of Present Illness: [name] is a 74 y.o. male who presents today for a 6 month check. Seen for [name]. He has HTN, morbid obesity, chronic atrial fib on coumadin and has a pacemaker in place. EF 35% per echo from March of 2013. He has been very reluctant to take more medicines and has not wanted a lot done for him. Dr. Virgina Jock checks his coumadin.   Seen by me back in April of 2014 - was losing weight. Saw [name] for his device check in June of 2014. He had gained all of his weight back. I saw him back in October - remained way off track with his diet - drinking way too much Dr. [name] again. Weight was up. I tried to get another echo but he did not wish to proceed.   Last seen back in August of 2015 - was doing ok - still not interested in further testing or more medicines.   Comes back today. Here with his significant other. He needs his Losartan refilled. Taking all of his Coreg at one time - forgets to split dose. He is having more swelling in his feet. His knees are really bothering him. He still gets too much salt. Still likes to go to [employer]. Still drinking too much Dr. [name]. Labs are checked by PCP. He says his breathing is still ok. He does not wish to make any changes with his medicines.   Past Medical History  Diagnosis Date  . Permanent atrial fibrillation     failed cardioversion in April of 2010  . Chronic anticoagulation   . Morbid obesity   . Osteoarthritis   . HTN (hypertension)   . 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 Urosepsis   . Prostate ca   . Nephrolithiasis   . Depression   . Gout   . Arthritis   . LV dysfunction     EF down to 30% per  echo Feb 2013 -  started on ARB and low dose beta blocker    Past Surgical History  Procedure Laterality Date  . Pacemaker placement  2010    Medtronic  . Total knee arthroplasty    . Radioactive seed implant    . Shoulder surgery       Medications: Current Outpatient Prescriptions  Medication Sig Dispense Refill  . allopurinol (ZYLOPRIM) 300 MG tablet TAKE ONE TABLET BY MOUTH ONCE DAILY 30 tablet 2  . carvedilol (COREG) 3.125 MG tablet TAKE ONE TABLET BY MOUTH TWICE DAILY 60 tablet 3  . celecoxib (CELEBREX) 200 MG capsule Take 200 mg by mouth every other day.      . clotrimazole-betamethasone (LOTRISONE) cream Apply 1 application topically as needed.     . hydrochlorothiazide (HYDRODIURIL) 25 MG tablet TAKE ONE TABLET BY MOUTH ONCE DAILY 90 tablet 0  . losartan (COZAAR) 50 MG tablet TAKE ONE TABLET BY MOUTH ONCE DAILY 90 tablet 1  . pravastatin (PRAVACHOL) 20 MG tablet Take 20 mg by mouth daily.     Marland Kitchen warfarin (COUMADIN) 5 MG tablet Take as directed by coumadin clinic 150 tablet 1  No current facility-administered medications for this visit.    Allergies: Allergies  Allergen Reactions  . Oxycodone   . Sertraline Hcl     Social History: The patient  reports that he has never smoked. He does not have any smokeless tobacco history on file. He reports that he does not drink alcohol or use illicit drugs.   Family History: The patient's family history includes Heart attack in his father.   Review of Systems: Please see the history of present illness.   Otherwise, the review of systems is positive for chronic swelling. He has chronic back pain.  All other systems are reviewed and negative.   Physical Exam: VS:  BP 130/80 mmHg  Pulse 73  Ht 5\' 11"  (1.803 m)  Wt 338 lb 12.8 oz (153.679 kg)  BMI 47.27 kg/m2 .  BMI Body mass index is 47.27 kg/(m^2).  Wt Readings from Last 3 Encounters:  12/27/14 338 lb 12.8 oz (153.679 kg)  06/21/14 337 lb 12.8 oz (153.225 kg)  11/23/13  342 lb 12.8 oz (155.493 kg)    General: Pleasant.  Morbidly obese. In no acute distress.  HEENT: Normal. Neck: Supple, thick. Cardiac: Irregular irregular rhythm. Rate ok. His heart tones are distant.  Chronic edema.  Respiratory:  Lungs are clear to auscultation bilaterally with normal work of breathing. Decreased breath sounds due to body habitus. GI: Soft and nontender.  MS: No deformity or atrophy. Gait and ROM intact. Skin: Warm and dry. Color is normal.  Neuro:  Strength and sensation are intact and no gross focal deficits noted.  Psych: Alert, appropriate and with normal affect.   LABORATORY DATA:  EKG:  EKG is ordered today. This shows a paced rhythm.   Lab Results  Component Value Date   WBC 6.4 08/24/2013   HGB 14.2 08/24/2013   HCT 41.8 08/24/2013   PLT 185.0 08/24/2013   GLUCOSE 150* 08/24/2013   CHOL  09/15/2008    126        ATP III CLASSIFICATION:  <200     mg/dL   Desirable  200-239  mg/dL   Borderline High  >=240    mg/dL   High   TRIG 181* 09/15/2008   HDL 11* 09/15/2008   LDLCALC  09/15/2008    79        Total Cholesterol/HDL:CHD Risk Coronary Heart Disease Risk Table                     Men   Women  1/2 Average Risk   3.4   3.3   ALT 21 11/03/2008   AST 19 11/03/2008   NA 140 08/24/2013   K 3.6 08/24/2013   CL 102 08/24/2013   CREATININE 1.1 08/24/2013   BUN 22 08/24/2013   CO2 29 08/24/2013   TSH 1.487 Test methodology is 3rd generation TSH 09/15/2008   INR 3.6 09/28/2013   HGBA1C * 09/15/2008    6.3 (NOTE)   The ADA recommends the following therapeutic goal for glycemic   control related to Hgb A1C measurement:   Goal of Therapy:   < 7.0% Hgb A1C   Reference: American Diabetes Association: Clinical Practice   Recommendations 2008, Diabetes Care,  2008, 31:(Suppl 1).    BNP (last 3 results) No results for input(s): BNP in the last 8760 hours.  ProBNP (last 3 results) No results for input(s): PROBNP in the last 8760 hours.   Other  Studies Reviewed Today:  Echo Study Conclusions from 2013  -  Left ventricle: The cavity size was normal. Wall thickness was normal. Systolic function was moderately reduced. The estimated ejection fraction was in the range of 35% to 40%. Diffuse hypokinesis. - Ventricular septum: Septal motion showed paradox.   Assessment/Plan: 1. Chronic systolic HF - seems stable - he is still not interested in further testing or changes in medicines. I tried to switch him over to Lasix - he does not wish to change from his current regimen. Needs to really restrict his salt intake.  Will see again in 6 months. Losartan refilled and recommended to try and split dose his Coreg.  2. Chronic atrial fib - managed with rate control and coumadin. EKG today is paced.  3. Morbid obesity - still the crux of his issues. I do not see this changing.   4. Underlying PPM - checking device today.   Current medicines are reviewed with the patient today.  The patient does not have concerns regarding medicines other than what has been noted above.  The following changes have been made:  See above.  Labs/ tests ordered today include:    Orders Placed This Encounter  Procedures  . EKG 12-Lead     Disposition:   FU with me in 6 months  Patient is agreeable to this plan and will call if any problems develop in the interim.   Signed: Burtis Junes, RN, ANP-C 12/27/2014 11:02 AM  [facility] [address] [place], [place]  [address] Phone: [phone] Fax: [phone]

## 2015-01-05 ENCOUNTER — Encounter: Payer: Self-pay | Admitting: Internal Medicine

## 2015-01-12 ENCOUNTER — Other Ambulatory Visit: Payer: Self-pay | Admitting: Internal Medicine

## 2015-01-25 LAB — MDC_IDC_ENUM_SESS_TYPE_REMOTE
Brady Statistic RV Percent Paced: 99.79 %
Date Time Interrogation Session: 20160310190643
MDC IDC MSMT BATTERY VOLTAGE: 2.85 V
MDC IDC MSMT LEADCHNL RA IMPEDANCE VALUE: 464 Ohm
MDC IDC MSMT LEADCHNL RA SENSING INTR AMPL: 0.2182
MDC IDC MSMT LEADCHNL RV IMPEDANCE VALUE: 408 Ohm
MDC IDC MSMT LEADCHNL RV SENSING INTR AMPL: 11.7824
MDC IDC SET LEADCHNL RV PACING AMPLITUDE: 2.5 V
MDC IDC SET LEADCHNL RV PACING PULSEWIDTH: 0.4 ms
MDC IDC SET LEADCHNL RV SENSING SENSITIVITY: 0.9 mV
Zone Setting Detection Interval: 350 ms
Zone Setting Detection Interval: 400 ms

## 2015-01-29 ENCOUNTER — Ambulatory Visit (INDEPENDENT_AMBULATORY_CARE_PROVIDER_SITE_OTHER): Payer: Medicare Other | Admitting: *Deleted

## 2015-01-29 ENCOUNTER — Telehealth: Payer: Self-pay | Admitting: Cardiology

## 2015-01-29 DIAGNOSIS — Z950 Presence of cardiac pacemaker: Secondary | ICD-10-CM

## 2015-01-29 NOTE — Telephone Encounter (Signed)
Attempted to confirm remote transmission with pt. No answer and was unable to leave a message.   

## 2015-01-29 NOTE — Progress Notes (Signed)
Remote pacemaker transmission.   

## 2015-02-06 ENCOUNTER — Encounter: Payer: Self-pay | Admitting: *Deleted

## 2015-02-13 ENCOUNTER — Encounter: Payer: Self-pay | Admitting: Internal Medicine

## 2015-02-27 ENCOUNTER — Other Ambulatory Visit: Payer: Self-pay | Admitting: Cardiovascular Disease

## 2015-03-01 ENCOUNTER — Ambulatory Visit (INDEPENDENT_AMBULATORY_CARE_PROVIDER_SITE_OTHER): Payer: Medicare Other | Admitting: *Deleted

## 2015-03-01 DIAGNOSIS — Z950 Presence of cardiac pacemaker: Secondary | ICD-10-CM

## 2015-03-01 NOTE — Progress Notes (Signed)
Remote pacemaker transmission.   

## 2015-03-05 LAB — MDC_IDC_ENUM_SESS_TYPE_REMOTE
Brady Statistic RV Percent Paced: 99.83 %
Lead Channel Impedance Value: 392 Ohm
Lead Channel Impedance Value: 480 Ohm
Lead Channel Setting Pacing Amplitude: 2.5 V
Lead Channel Setting Pacing Pulse Width: 0.4 ms
Lead Channel Setting Sensing Sensitivity: 0.9 mV
MDC IDC MSMT BATTERY VOLTAGE: 2.83 V
MDC IDC MSMT LEADCHNL RA SENSING INTR AMPL: 0.262 mV
MDC IDC SESS DTM: 20160414152455
MDC IDC SET ZONE DETECTION INTERVAL: 400 ms
Zone Setting Detection Interval: 350 ms

## 2015-03-20 ENCOUNTER — Encounter: Payer: Self-pay | Admitting: Cardiology

## 2015-03-26 ENCOUNTER — Encounter: Payer: Self-pay | Admitting: Internal Medicine

## 2015-04-03 ENCOUNTER — Ambulatory Visit (INDEPENDENT_AMBULATORY_CARE_PROVIDER_SITE_OTHER): Payer: Medicare Other | Admitting: *Deleted

## 2015-04-03 DIAGNOSIS — I495 Sick sinus syndrome: Secondary | ICD-10-CM | POA: Diagnosis not present

## 2015-04-03 NOTE — Progress Notes (Signed)
Remote pacemaker transmission.   

## 2015-04-15 ENCOUNTER — Other Ambulatory Visit: Payer: Self-pay | Admitting: Internal Medicine

## 2015-04-16 LAB — CUP PACEART REMOTE DEVICE CHECK
Battery Voltage: 2.83 V
Brady Statistic RV Percent Paced: 99.76 %
Date Time Interrogation Session: 20160517164443
Lead Channel Impedance Value: 408 Ohm
Lead Channel Sensing Intrinsic Amplitude: 11.446 mV
MDC IDC MSMT LEADCHNL RV IMPEDANCE VALUE: 376 Ohm
MDC IDC SET LEADCHNL RV PACING AMPLITUDE: 2.5 V
MDC IDC SET LEADCHNL RV PACING PULSEWIDTH: 0.4 ms
MDC IDC SET LEADCHNL RV SENSING SENSITIVITY: 0.9 mV
MDC IDC SET ZONE DETECTION INTERVAL: 400 ms
Zone Setting Detection Interval: 350 ms

## 2015-04-21 ENCOUNTER — Other Ambulatory Visit: Payer: Self-pay | Admitting: Cardiovascular Disease

## 2015-04-25 ENCOUNTER — Encounter: Payer: Self-pay | Admitting: Cardiology

## 2015-04-30 ENCOUNTER — Other Ambulatory Visit: Payer: Self-pay | Admitting: Cardiovascular Disease

## 2015-04-30 ENCOUNTER — Encounter: Payer: Self-pay | Admitting: Internal Medicine

## 2015-05-07 ENCOUNTER — Ambulatory Visit (INDEPENDENT_AMBULATORY_CARE_PROVIDER_SITE_OTHER): Payer: Medicare Other | Admitting: *Deleted

## 2015-05-07 ENCOUNTER — Telehealth: Payer: Self-pay | Admitting: Cardiology

## 2015-05-07 DIAGNOSIS — I495 Sick sinus syndrome: Secondary | ICD-10-CM

## 2015-05-07 NOTE — Telephone Encounter (Signed)
Spoke with pt and reminded pt of remote transmission that is due today. Pt verbalized understanding and stated he will send transmission in morning when he gets home.

## 2015-05-08 ENCOUNTER — Encounter: Payer: Self-pay | Admitting: Cardiology

## 2015-05-08 DIAGNOSIS — I495 Sick sinus syndrome: Secondary | ICD-10-CM

## 2015-05-08 NOTE — Progress Notes (Signed)
Remote pacemaker transmission.   

## 2015-05-09 LAB — CUP PACEART REMOTE DEVICE CHECK
Battery Voltage: 2.82 V
Brady Statistic RV Percent Paced: 99.79 %
Date Time Interrogation Session: 20160621141825
Lead Channel Impedance Value: 400 Ohm
Lead Channel Setting Pacing Pulse Width: 0.4 ms
MDC IDC MSMT LEADCHNL RA IMPEDANCE VALUE: 416 Ohm
MDC IDC SET LEADCHNL RV PACING AMPLITUDE: 2.5 V
MDC IDC SET LEADCHNL RV SENSING SENSITIVITY: 0.9 mV
MDC IDC SET ZONE DETECTION INTERVAL: 350 ms
Zone Setting Detection Interval: 400 ms

## 2015-05-10 ENCOUNTER — Encounter: Payer: Self-pay | Admitting: Cardiology

## 2015-05-16 ENCOUNTER — Encounter: Payer: Self-pay | Admitting: Internal Medicine

## 2015-05-17 ENCOUNTER — Other Ambulatory Visit: Payer: Self-pay | Admitting: *Deleted

## 2015-05-17 MED ORDER — HYDROCHLOROTHIAZIDE 25 MG PO TABS: 25.0000 mg | ORAL_TABLET | Freq: Every day | ORAL | Status: DC

## 2015-05-31 ENCOUNTER — Other Ambulatory Visit: Payer: Self-pay | Admitting: *Deleted

## 2015-05-31 MED ORDER — ALLOPURINOL 300 MG PO TABS: 300.0000 mg | ORAL_TABLET | Freq: Every day | ORAL | Status: DC

## 2015-06-05 ENCOUNTER — Ambulatory Visit (INDEPENDENT_AMBULATORY_CARE_PROVIDER_SITE_OTHER): Payer: Medicare Other | Admitting: *Deleted

## 2015-06-05 DIAGNOSIS — Z950 Presence of cardiac pacemaker: Secondary | ICD-10-CM

## 2015-06-05 NOTE — Progress Notes (Signed)
Remote pacemaker transmission.   

## 2015-06-06 LAB — CUP PACEART REMOTE DEVICE CHECK
Battery Voltage: 2.82 V
Brady Statistic RV Percent Paced: 99.79 %
Date Time Interrogation Session: 20160719144337
Lead Channel Impedance Value: 408 Ohm
Lead Channel Setting Pacing Amplitude: 2.5 V
Lead Channel Setting Pacing Pulse Width: 0.4 ms
Lead Channel Setting Sensing Sensitivity: 0.9 mV
MDC IDC MSMT LEADCHNL RA IMPEDANCE VALUE: 424 Ohm
MDC IDC SET ZONE DETECTION INTERVAL: 350 ms
Zone Setting Detection Interval: 400 ms

## 2015-06-19 ENCOUNTER — Other Ambulatory Visit: Payer: Self-pay | Admitting: Internal Medicine

## 2015-06-22 ENCOUNTER — Encounter: Payer: Self-pay | Admitting: Cardiology

## 2015-06-26 ENCOUNTER — Encounter: Payer: Self-pay | Admitting: Internal Medicine

## 2015-06-29 ENCOUNTER — Other Ambulatory Visit: Payer: Self-pay | Admitting: Cardiovascular Disease

## 2015-06-29 NOTE — Telephone Encounter (Signed)
Ok to refill 

## 2015-07-02 ENCOUNTER — Other Ambulatory Visit: Payer: Self-pay | Admitting: Cardiovascular Disease

## 2015-07-13 ENCOUNTER — Ambulatory Visit (INDEPENDENT_AMBULATORY_CARE_PROVIDER_SITE_OTHER): Payer: Medicare Other | Admitting: Nurse Practitioner

## 2015-07-13 ENCOUNTER — Encounter: Payer: Self-pay | Admitting: Nurse Practitioner

## 2015-07-13 ENCOUNTER — Ambulatory Visit (INDEPENDENT_AMBULATORY_CARE_PROVIDER_SITE_OTHER): Payer: Medicare Other | Admitting: *Deleted

## 2015-07-13 VITALS — BP 110/60 | HR 68 | Ht 72.0 in | Wt 335.4 lb

## 2015-07-13 DIAGNOSIS — I4821 Permanent atrial fibrillation: Secondary | ICD-10-CM

## 2015-07-13 DIAGNOSIS — I5022 Chronic systolic (congestive) heart failure: Secondary | ICD-10-CM

## 2015-07-13 DIAGNOSIS — R0600 Dyspnea, unspecified: Secondary | ICD-10-CM

## 2015-07-13 DIAGNOSIS — Z950 Presence of cardiac pacemaker: Secondary | ICD-10-CM | POA: Diagnosis not present

## 2015-07-13 DIAGNOSIS — I482 Chronic atrial fibrillation: Secondary | ICD-10-CM | POA: Diagnosis not present

## 2015-07-13 DIAGNOSIS — I442 Atrioventricular block, complete: Secondary | ICD-10-CM

## 2015-07-13 LAB — CUP PACEART INCLINIC DEVICE CHECK
Lead Channel Impedance Value: 408 Ohm
MDC IDC MSMT BATTERY VOLTAGE: 2.81 V
MDC IDC MSMT LEADCHNL RV IMPEDANCE VALUE: 376 Ohm
MDC IDC MSMT LEADCHNL RV SENSING INTR AMPL: 9.4259
MDC IDC SESS DTM: 20160826122412
MDC IDC SET LEADCHNL RV PACING AMPLITUDE: 2.5 V
MDC IDC SET LEADCHNL RV PACING PULSEWIDTH: 0.4 ms
MDC IDC SET LEADCHNL RV SENSING SENSITIVITY: 0.9 mV
MDC IDC STAT BRADY RV PERCENT PACED: 99.09 %
Zone Setting Detection Interval: 350 ms
Zone Setting Detection Interval: 400 ms

## 2015-07-13 LAB — BASIC METABOLIC PANEL WITH GFR
BUN: 24 mg/dL — ABNORMAL HIGH (ref 6–23)
CO2: 30 meq/L (ref 19–32)
Calcium: 9.6 mg/dL (ref 8.4–10.5)
Chloride: 99 meq/L (ref 96–112)
Creatinine, Ser: 1.15 mg/dL (ref 0.40–1.50)
GFR: 66.24 mL/min
Glucose, Bld: 154 mg/dL — ABNORMAL HIGH (ref 70–99)
Potassium: 3.8 meq/L (ref 3.5–5.1)
Sodium: 138 meq/L (ref 135–145)

## 2015-07-13 LAB — CBC
HCT: 44.3 % (ref 39.0–52.0)
Hemoglobin: 15.1 g/dL (ref 13.0–17.0)
MCHC: 34.1 g/dL (ref 30.0–36.0)
MCV: 89.3 fl (ref 78.0–100.0)
Platelets: 194 10*3/uL (ref 150.0–400.0)
RBC: 4.96 Mil/uL (ref 4.22–5.81)
RDW: 14.6 % (ref 11.5–15.5)
WBC: 6.5 10*3/uL (ref 4.0–10.5)

## 2015-07-13 LAB — BRAIN NATRIURETIC PEPTIDE: Pro B Natriuretic peptide (BNP): 66 pg/mL (ref 0.0–100.0)

## 2015-07-13 NOTE — Progress Notes (Signed)
Battery check only. ERI reached 06/10/15. ROV w/ Amber 08/06/15 to discuss changeout. Pt overdue to see [name], last seen June/2014.

## 2015-07-13 NOTE — Progress Notes (Signed)
CARDIOLOGY OFFICE NOTE  Date:  07/13/2015    [name] Date of Birth: 1941/04/30 Medical Record [id number]  PCP:  Donald Reel, MD  Cardiologist:  [name]    Chief Complaint  Patient presents with  . Atrial Fibrillation    6 month check. Seen for Dr. Rayann [name]    History of Present Illness: [name] is a 74 y.o. male who presents today for a follow up visit. Seen for Dr. Rayann [name]. He has HTN, morbid obesity, chronic atrial fib on coumadin and has a pacemaker in place. EF 35% per echo from March of 2013. He has been very reluctant to take more medicines and has not wanted a lot [name] for him. Dr. Virgina [name] checks his coumadin.   Seen by me back in April of 2014 - was losing weight. Saw Dr. Rayann [name] for his device check in June of 2014. He had gained all of his weight back. I saw him back in October - remained way off track with his diet - drinking way too much Dr. Malachi [name] again. Weight was up. I tried to get another echo but he did not wish to proceed.   Last seen back in August of 2015 - was doing ok - still not interested in further testing or more medicines.   I last saw him in February - he was doing ok. Continued to express his desire to not do too much for him in regards to testing/changing meds, etc..   Comes back today. Here with his significant other. Breathing has been pretty hard this summer due to the heat. He feels like he is definitely more short of breath. He is dizzy. This is more so when he gets up too quick. More sedentary now. His swelling has improved. He is down a couple of pounds. Still loves Dr. Malachi [name] and sweet tea. No actual chest pain. He is agreeable to having a repeat echo and labs today - this is unusual and probably reflects that he is not feeling well.   Past Medical History  Diagnosis Date  . Permanent atrial fibrillation     failed cardioversion in April of 2010  . Chronic anticoagulation   . Morbid obesity   . Osteoarthritis   . HTN (hypertension)    . 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 Urosepsis   . Prostate ca   . Nephrolithiasis   . Depression   . Gout   . Arthritis   . LV dysfunction     EF down to 30% per echo Feb 2013 -  started on ARB and low dose beta blocker    Past Surgical History  Procedure Laterality Date  . Pacemaker placement  2010    Medtronic  . Total knee arthroplasty    . Radioactive seed implant    . Shoulder surgery       Medications: Current Outpatient Prescriptions  Medication Sig Dispense Refill  . allopurinol (ZYLOPRIM) 300 MG tablet TAKE ONE TABLET BY MOUTH ONCE DAILY 30 tablet 0  . carvedilol (COREG) 3.125 MG tablet TAKE ONE TABLET BY MOUTH TWICE DAILY 60 tablet 5  . celecoxib (CELEBREX) 200 MG capsule Take 200 mg by mouth every other day.      . clotrimazole-betamethasone (LOTRISONE) cream Apply 1 application topically as needed.     . hydrochlorothiazide (HYDRODIURIL) 25 MG tablet TAKE ONE TABLET BY MOUTH ONCE DAILY. NEEDS OV 30 tablet 0  . losartan (COZAAR) 50 MG tablet TAKE ONE TABLET BY MOUTH ONCE DAILY  90 tablet 3  . metFORMIN (GLUCOPHAGE) 500 MG tablet Take 500 mg by mouth daily with breakfast.     . pravastatin (PRAVACHOL) 20 MG tablet Take 20 mg by mouth daily.     Marland Kitchen triamcinolone (KENALOG) 0.025 % cream Apply 1 application topically as needed (skin irritation).     . warfarin (COUMADIN) 5 MG tablet Take as directed by coumadin clinic 150 tablet 1   No current facility-administered medications for this visit.    Allergies: Allergies  Allergen Reactions  . Oxycodone   . Sertraline Hcl     Social History: The patient  reports that he has never smoked. He does not have any smokeless tobacco history on file. He reports that he does not drink alcohol or use illicit drugs.   Family History: The patient's family history includes Heart attack in his father.   Review of Systems: Please see the history of present illness.   Otherwise, the review of systems is positive for none.   All other  systems are reviewed and negative.   Physical Exam: VS:  BP 110/60 mmHg  Pulse 68  Ht 6' (1.829 m)  Wt 335 lb 6.4 oz (152.136 kg)  BMI 45.48 kg/m2 .  BMI Body mass index is 45.48 kg/(m^2).  Wt Readings from Last 3 Encounters:  07/13/15 335 lb 6.4 oz (152.136 kg)  12/27/14 338 lb 12.8 oz (153.679 kg)  06/21/14 337 lb 12.8 oz (153.225 kg)    General: Pleasant. Morbidly obese but in no acute distress.  HEENT: Normal. Neck: Supple, no JVD, carotid bruits, or masses noted.  Cardiac: Fairly regular rate and rhythm - presumed paced. No murmurs, rubs, or gallops. +1 edema.  Respiratory:  Lungs are clear to auscultation bilaterally with normal work of breathing.  GI: Soft and nontender.  MS: No deformity or atrophy. Gait and ROM intact. Skin: Warm and dry. Color is normal.  Neuro:  Strength and sensation are intact and no gross focal deficits noted.  Psych: Alert, appropriate and with normal affect.   LABORATORY DATA:  EKG:  EKG is not ordered today.   Lab Results  Component Value Date   WBC 6.4 08/24/2013   HGB 14.2 08/24/2013   HCT 41.8 08/24/2013   PLT 185.0 08/24/2013   GLUCOSE 150* 08/24/2013   CHOL  09/15/2008    126        ATP III CLASSIFICATION:  <200     mg/dL   Desirable  200-239  mg/dL   Borderline High  >=240    mg/dL   High   TRIG 181* 09/15/2008   HDL 11* 09/15/2008   LDLCALC  09/15/2008    79        Total Cholesterol/HDL:CHD Risk Coronary Heart Disease Risk Table                     Men   Women  1/2 Average Risk   3.4   3.3   ALT 21 11/03/2008   AST 19 11/03/2008   NA 140 08/24/2013   K 3.6 08/24/2013   CL 102 08/24/2013   CREATININE 1.1 08/24/2013   BUN 22 08/24/2013   CO2 29 08/24/2013   TSH 1.487 Test methodology is 3rd generation TSH 09/15/2008   INR 3.6 09/28/2013   HGBA1C * 09/15/2008    6.3 (NOTE)   The ADA recommends the following therapeutic goal for glycemic   control related to Hgb A1C measurement:   Goal of Therapy:   <  7.0% Hgb A1C    Reference: American Diabetes Association: Clinical Practice   Recommendations 2008, Diabetes Care,  2008, 31:(Suppl 1).    BNP (last 3 results) No results for input(s): BNP in the last 8760 hours.  ProBNP (last 3 results) No results for input(s): PROBNP in the last 8760 hours.   Other Studies Reviewed Today:  Echo Study Conclusions from 2013  - Left ventricle: The cavity size was normal. Wall thickness was normal. Systolic function was moderately reduced. The estimated ejection fraction was in the range of 35% to 40%. Diffuse hypokinesis. - Ventricular septum: Septal motion showed paradox.   Assessment/Plan: 1. Chronic systolic HF - more symptomatic probably NYHA 2/3 - he is actually agreeable to getting his echo updated. Check labs today - further disposition to follow.   2. Chronic atrial fib - managed with rate control and coumadin.  3. Morbid obesity - still the crux of his issues. I do not see this changing.   4. Underlying PPM - checking device today.   Current medicines are reviewed with the patient today.  The patient does not have concerns regarding medicines other than what has been noted above.  The following changes have been made:  See above.  Labs/ tests ordered today include:    Orders Placed This Encounter  Procedures  . Basic metabolic panel  . CBC  . Brain natriuretic peptide  . ECHOCARDIOGRAM COMPLETE     Disposition:   FU with me in 6 months unless echo is considerably changed.   Patient is agreeable to this plan and will call if any problems develop in the interim.   Signed: Burtis Junes, RN, ANP-C 07/13/2015 11:38 AM  [facility] [address] [place], [place]  [address] Phone: [phone] Fax: [phone]

## 2015-07-13 NOTE — Patient Instructions (Addendum)
We will be checking the following labs today - BNP, BMET, CBC   Medication Instructions:    Continue with your current medicines.     Testing/Procedures To Be Arranged:  Echocardiogram  Follow-Up:   See me in 6 months - unless the ultrasound shows a change - then will get you back here sooner    Other Special Instructions:   N/A  Call the [facility] office at [phone] if you have any questions, problems or concerns.

## 2015-07-16 ENCOUNTER — Other Ambulatory Visit: Payer: Self-pay | Admitting: Internal Medicine

## 2015-07-19 ENCOUNTER — Encounter: Payer: Self-pay | Admitting: Physician Assistant

## 2015-07-19 ENCOUNTER — Other Ambulatory Visit: Payer: Self-pay

## 2015-07-19 ENCOUNTER — Ambulatory Visit (HOSPITAL_COMMUNITY): Payer: Medicare Other | Attending: Cardiology

## 2015-07-19 DIAGNOSIS — I482 Chronic atrial fibrillation: Secondary | ICD-10-CM | POA: Diagnosis not present

## 2015-07-19 DIAGNOSIS — I509 Heart failure, unspecified: Secondary | ICD-10-CM | POA: Diagnosis present

## 2015-07-19 DIAGNOSIS — I5022 Chronic systolic (congestive) heart failure: Secondary | ICD-10-CM

## 2015-07-19 DIAGNOSIS — Z950 Presence of cardiac pacemaker: Secondary | ICD-10-CM

## 2015-07-19 DIAGNOSIS — I4821 Permanent atrial fibrillation: Secondary | ICD-10-CM

## 2015-07-19 DIAGNOSIS — Z8249 Family history of ischemic heart disease and other diseases of the circulatory system: Secondary | ICD-10-CM | POA: Insufficient documentation

## 2015-07-19 DIAGNOSIS — I517 Cardiomegaly: Secondary | ICD-10-CM | POA: Insufficient documentation

## 2015-07-19 DIAGNOSIS — I340 Nonrheumatic mitral (valve) insufficiency: Secondary | ICD-10-CM | POA: Diagnosis not present

## 2015-07-19 DIAGNOSIS — I10 Essential (primary) hypertension: Secondary | ICD-10-CM | POA: Diagnosis not present

## 2015-07-19 DIAGNOSIS — I371 Nonrheumatic pulmonary valve insufficiency: Secondary | ICD-10-CM | POA: Diagnosis not present

## 2015-07-31 ENCOUNTER — Other Ambulatory Visit: Payer: Self-pay | Admitting: Cardiovascular Disease

## 2015-08-04 ENCOUNTER — Other Ambulatory Visit: Payer: Self-pay | Admitting: *Deleted

## 2015-08-04 DIAGNOSIS — I442 Atrioventricular block, complete: Secondary | ICD-10-CM

## 2015-08-06 ENCOUNTER — Encounter: Payer: Medicare Other | Admitting: Nurse Practitioner

## 2015-08-08 ENCOUNTER — Encounter: Payer: Self-pay | Admitting: *Deleted

## 2015-08-08 ENCOUNTER — Telehealth: Payer: Self-pay | Admitting: Nurse Practitioner

## 2015-08-08 NOTE — Telephone Encounter (Signed)
lmom for patient taht he did not need to come for that appointment

## 2015-08-08 NOTE — Telephone Encounter (Signed)
New message  On 08-13-15 Pt wants know if he still has to come in for that appt?

## 2015-08-13 ENCOUNTER — Encounter: Payer: Medicare Other | Admitting: Internal Medicine

## 2015-08-17 ENCOUNTER — Encounter (HOSPITAL_COMMUNITY)
Admission: RE | Disposition: A | Discharge status: Home / Self Care | Payer: Medicare Other | Source: Ambulatory Visit | Attending: Internal Medicine

## 2015-08-17 ENCOUNTER — Ambulatory Visit (HOSPITAL_COMMUNITY)
Admission: RE | Admit: 2015-08-17 | Discharge: 2015-08-17 | Disposition: A | Discharge status: Home / Self Care | Payer: Medicare Other | Source: Ambulatory Visit | Attending: Internal Medicine | Admitting: Internal Medicine

## 2015-08-17 ENCOUNTER — Encounter (HOSPITAL_COMMUNITY): Payer: Self-pay | Admitting: Internal Medicine

## 2015-08-17 DIAGNOSIS — F329 Major depressive disorder, single episode, unspecified: Secondary | ICD-10-CM | POA: Insufficient documentation

## 2015-08-17 DIAGNOSIS — Z45010 Encounter for checking and testing of cardiac pacemaker pulse generator [battery]: Secondary | ICD-10-CM | POA: Diagnosis not present

## 2015-08-17 DIAGNOSIS — M109 Gout, unspecified: Secondary | ICD-10-CM | POA: Insufficient documentation

## 2015-08-17 DIAGNOSIS — Z8546 Personal history of malignant neoplasm of prostate: Secondary | ICD-10-CM | POA: Diagnosis not present

## 2015-08-17 DIAGNOSIS — I10 Essential (primary) hypertension: Secondary | ICD-10-CM | POA: Insufficient documentation

## 2015-08-17 DIAGNOSIS — Z7901 Long term (current) use of anticoagulants: Secondary | ICD-10-CM | POA: Diagnosis not present

## 2015-08-17 DIAGNOSIS — Z950 Presence of cardiac pacemaker: Secondary | ICD-10-CM | POA: Diagnosis present

## 2015-08-17 DIAGNOSIS — I482 Chronic atrial fibrillation: Secondary | ICD-10-CM | POA: Diagnosis not present

## 2015-08-17 DIAGNOSIS — Z8249 Family history of ischemic heart disease and other diseases of the circulatory system: Secondary | ICD-10-CM | POA: Insufficient documentation

## 2015-08-17 DIAGNOSIS — Z87442 Personal history of urinary calculi: Secondary | ICD-10-CM | POA: Diagnosis not present

## 2015-08-17 DIAGNOSIS — I4821 Permanent atrial fibrillation: Secondary | ICD-10-CM | POA: Diagnosis present

## 2015-08-17 DIAGNOSIS — M1990 Unspecified osteoarthritis, unspecified site: Secondary | ICD-10-CM | POA: Insufficient documentation

## 2015-08-17 DIAGNOSIS — I442 Atrioventricular block, complete: Secondary | ICD-10-CM | POA: Insufficient documentation

## 2015-08-17 HISTORY — PX: EP IMPLANTABLE DEVICE: SHX172B

## 2015-08-17 LAB — SURGICAL PCR SCREEN
MRSA, PCR: NEGATIVE
STAPHYLOCOCCUS AUREUS: NEGATIVE

## 2015-08-17 LAB — CBC
HCT: 43.8 % (ref 39.0–52.0)
HEMOGLOBIN: 14.7 g/dL (ref 13.0–17.0)
MCH: 30.1 pg (ref 26.0–34.0)
MCHC: 33.6 g/dL (ref 30.0–36.0)
MCV: 89.6 fL (ref 78.0–100.0)
Platelets: 182 10*3/uL (ref 150–400)
RBC: 4.89 MIL/uL (ref 4.22–5.81)
RDW: 13.9 % (ref 11.5–15.5)
WBC: 7 10*3/uL (ref 4.0–10.5)

## 2015-08-17 LAB — GLUCOSE, CAPILLARY
GLUCOSE-CAPILLARY: 171 mg/dL — AB (ref 65–99)
GLUCOSE-CAPILLARY: 196 mg/dL — AB (ref 65–99)

## 2015-08-17 LAB — BASIC METABOLIC PANEL
ANION GAP: 9 (ref 5–15)
BUN: 24 mg/dL — ABNORMAL HIGH (ref 6–20)
CALCIUM: 9.5 mg/dL (ref 8.9–10.3)
CHLORIDE: 99 mmol/L — AB (ref 101–111)
CO2: 28 mmol/L (ref 22–32)
Creatinine, Ser: 1.22 mg/dL (ref 0.61–1.24)
GFR calc non Af Amer: 57 mL/min — ABNORMAL LOW (ref >60)
Glucose, Bld: 181 mg/dL — ABNORMAL HIGH (ref 65–99)
Potassium: 3.6 mmol/L (ref 3.5–5.1)
Sodium: 136 mmol/L (ref 135–145)

## 2015-08-17 LAB — PROTIME-INR
INR: 1.41 (ref 0.00–1.49)
PROTHROMBIN TIME: 17.3 s — AB (ref 11.6–15.2)

## 2015-08-17 SURGERY — PPM/BIV PPM GENERATOR CHANGEOUT

## 2015-08-17 MED ORDER — SODIUM CHLORIDE 0.9 % IR SOLN
Status: DC | PRN
  Administered 2015-08-17: 08:00:00

## 2015-08-17 MED ORDER — MIDAZOLAM HCL 5 MG/5ML IJ SOLN
INTRAMUSCULAR | Status: AC
  Filled 2015-08-17: qty 25

## 2015-08-17 MED ORDER — ONDANSETRON HCL 4 MG/2ML IJ SOLN: 4.0000 mg | Freq: Four times a day (QID) | INTRAMUSCULAR | Status: DC | PRN

## 2015-08-17 MED ORDER — MUPIROCIN 2 % EX OINT
TOPICAL_OINTMENT | CUTANEOUS | Status: AC
  Filled 2015-08-17: qty 22

## 2015-08-17 MED ORDER — GENTAMICIN SULFATE 40 MG/ML IJ SOLN: 80.0000 mg | INTRAMUSCULAR | Status: DC

## 2015-08-17 MED ORDER — CEFAZOLIN SODIUM-DEXTROSE 2-3 GM-% IV SOLR
INTRAVENOUS | Status: AC
  Filled 2015-08-17: qty 50

## 2015-08-17 MED ORDER — MUPIROCIN 2 % EX OINT
1.0000 "application " | TOPICAL_OINTMENT | Freq: Once | CUTANEOUS | Status: AC
  Administered 2015-08-17: 1 via TOPICAL

## 2015-08-17 MED ORDER — MIDAZOLAM HCL 5 MG/5ML IJ SOLN
INTRAMUSCULAR | Status: DC | PRN
  Administered 2015-08-17: 2 mg via INTRAVENOUS

## 2015-08-17 MED ORDER — SODIUM CHLORIDE 0.9 % IJ SOLN: 3.0000 mL | INTRAMUSCULAR | Status: DC | PRN

## 2015-08-17 MED ORDER — SODIUM CHLORIDE 0.9 % IJ SOLN
3.0000 mL | Freq: Two times a day (BID) | INTRAMUSCULAR | Status: DC
Start: 2015-08-17 — End: 2015-08-17

## 2015-08-17 MED ORDER — LIDOCAINE HCL (PF) 1 % IJ SOLN
INTRAMUSCULAR | Status: AC
  Filled 2015-08-17: qty 30

## 2015-08-17 MED ORDER — FENTANYL CITRATE (PF) 100 MCG/2ML IJ SOLN
INTRAMUSCULAR | Status: AC
  Filled 2015-08-17: qty 4

## 2015-08-17 MED ORDER — SODIUM CHLORIDE 0.9 % IV SOLN: 250.0000 mL | INTRAVENOUS | Status: DC | PRN

## 2015-08-17 MED ORDER — SODIUM CHLORIDE 0.9 % IR SOLN
Status: AC
  Filled 2015-08-17: qty 2

## 2015-08-17 MED ORDER — CEFAZOLIN SODIUM-DEXTROSE 2-3 GM-% IV SOLR: 2.0000 g | INTRAVENOUS | Status: DC

## 2015-08-17 MED ORDER — CEFAZOLIN SODIUM-DEXTROSE 2-3 GM-% IV SOLR
INTRAVENOUS | Status: DC | PRN
  Administered 2015-08-17: 2 g via INTRAVENOUS

## 2015-08-17 MED ORDER — LIDOCAINE HCL (PF) 1 % IJ SOLN
INTRAMUSCULAR | Status: AC
Start: 2015-08-17 — End: 2015-08-17
  Filled 2015-08-17: qty 30

## 2015-08-17 MED ORDER — CHLORHEXIDINE GLUCONATE 4 % EX LIQD: 60.0000 mL | Freq: Once | CUTANEOUS | Status: DC

## 2015-08-17 MED ORDER — ACETAMINOPHEN 325 MG PO TABS
325.0000 mg | ORAL_TABLET | ORAL | Status: DC | PRN
  Filled 2015-08-17: qty 2

## 2015-08-17 MED ORDER — LIDOCAINE HCL (PF) 1 % IJ SOLN
INTRAMUSCULAR | Status: DC | PRN
  Administered 2015-08-17: 15 mL

## 2015-08-17 MED ORDER — SODIUM CHLORIDE 0.9 % IV SOLN
INTRAVENOUS | Status: DC
  Administered 2015-08-17: 07:00:00 via INTRAVENOUS

## 2015-08-17 MED ORDER — FENTANYL CITRATE (PF) 100 MCG/2ML IJ SOLN
INTRAMUSCULAR | Status: DC | PRN
  Administered 2015-08-17: 25 ug via INTRAVENOUS

## 2015-08-17 SURGICAL SUPPLY — 5 items
CABLE SURGICAL S-101-97-12 (CABLE) ×3 IMPLANT
PACEMAKER ADAPTA DR ADDRL1 (Pacemaker) ×1 IMPLANT
PAD DEFIB LIFELINK (PAD) ×3 IMPLANT
PPM ADAPTA DR ADDRL1 (Pacemaker) ×3 IMPLANT
TRAY PACEMAKER INSERTION (CUSTOM PROCEDURE TRAY) ×3 IMPLANT

## 2015-08-17 NOTE — Discharge Instructions (Signed)
Pacemaker Battery Change °A pacemaker battery usually lasts 4 to 12 years. Once or twice per year, you will be asked to visit your health care provider to have a full evaluation of your pacemaker. When a battery needs to be replaced, the entire pacemaker is replaced so that you can benefit from new circuitry and any new features that have been added to pacemakers. Most often, this procedure is very simple because the leads are already in place.  °There are many things that affect how long a pacemaker battery will last, including:  °· The age of the pacemaker.   °· The number of leads (1, 2, or 3).   °· The pacemaker work load. If the pacemaker is helping the heart more often, the battery will not last as long as it would if the pacemaker did not need to help the heart.   °· Power (voltage) settings.  °LET YOUR HEALTH CARE PROVIDER KNOW ABOUT:  °· Any allergies you have.   °· All medicines you are taking, including vitamins, herbs, eye drops, creams, and over-the-counter medicines.   °· Previous problems you or members of your family have had with the use of anesthetics.   °· Any blood disorders you have.   °· Previous surgeries you have had, especially since your last pacemaker placement.   °· Medical conditions you have.   °· Possibility of pregnancy, if this applies. °· Symptoms of chest pain, trouble breathing, palpitations, light-headedness, or feelings of an abnormal or irregular heartbeat. °RISKS AND COMPLICATIONS  °Generally, this is a safe procedure. However, as with any procedure, problems can occur and include:  °· Bleeding.   °· Bruising of the skin around where the incision was made.   °· Pain at the incision site.   °· Pulling apart of the skin at the incision site.   °· Infection.   °· Allergic reaction to anesthetics or other medicines used during the procedure.   °People with diabetes may have a temporary increase in their blood sugar after any surgical procedure.  °BEFORE THE PROCEDURE  °· Wash all  of the skin around the area of the chest where the pacemaker is located.   °· Ask your health care provider for help with any medicine adjustments before the pacemaker is replaced.   °· Do not eat or drink anything after midnight on the night before the procedure or as directed by your health care provider. °· Ask your health care provider if you can take a sip of water with any approved medicines the morning of the procedure. °PROCEDURE  °· After giving medicine to numb the skin (local anesthetic), your health care provider will make a cut to reopen the pocket holding the pacemaker.   °· The old pacemaker will be disconnected from its leads.   °· The leads will be tested.   °· If needed, the leads will be replaced. If the leads are functioning properly, the new pacemaker may be connected to the existing leads. °· A heart monitor and the pacemaker programmer will be used to make sure that the new pacemaker is working properly. °· The incision site will then be closed. A dressing will be placed over the pacemaker site. The dressing will be removed 24-48 hours afterward. °AFTER THE PROCEDURE  °· You will be taken to a recovery area after the new pacemaker implant is completed. Your vital signs such as blood pressure, heart rate, breathing, and oxygen levels will be monitored. °· Your health care provider will tell you when you will need to next test your pacemaker or when to return to the office for follow-up   for removal of stitches. °Document Released: 02/11/2007 Document Revised: 03/20/2014 Document Reviewed: 05/18/2013 °ExitCare® Patient Information ©2015 [employer]. This information is not intended to replace advice given to you by your health care provider. Make sure you discuss any questions you have with your health care provider. ° °

## 2015-08-17 NOTE — Progress Notes (Signed)
IV saline locked. 12-lead EKG done.

## 2015-08-17 NOTE — H&P (Signed)
PCP: [name], MD  The patient presents today for pacemaker generator change. He was recently seen by Truitt Merle and found to be at [facility] battery status.  He continues to struggle with his weight and associated poor exercise tolerance.  He has had repeat echo which reveals EF normalization with medical therapy.  Today, he denies symptoms of palpitations, chest pain, dizziness, presyncope, syncope, or neurologic sequela. The patient feels that he is tolerating medications without difficulties and is otherwise without complaint today.   Past Medical History  Diagnosis Date  . Permanent atrial fibrillation     failed cardioversion in April of 2010  . Chronic anticoagulation   . Morbid obesity   . Osteoarthritis   . HTN (hypertension)   . 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 Urosepsis   . Prostate ca   . Nephrolithiasis   . Depression   . Gout   . Arthritis   . LV dysfunction     EF down to 30% per echo Feb 2013 - started on ARB and low dose beta blocker   Past Surgical History  Procedure Laterality Date  . Pacemaker placement  2010    Medtronic  . Total knee arthroplasty    . Radioactive seed implant    . Shoulder surgery      Current Outpatient Prescriptions  Medication Sig Dispense Refill  . allopurinol (ZYLOPRIM) 300 MG tablet TAKE ONE TABLET BY MOUTH EVERY DAY 30 tablet 6  . carvedilol (COREG) 3.125 MG tablet TAKE ONE TABLET BY MOUTH TWICE DAILY 60 tablet 3  . celecoxib (CELEBREX) 200 MG capsule Take 200 mg by mouth every other day.     . clotrimazole-betamethasone (LOTRISONE) cream Apply 1 application topically as needed.     . hydrochlorothiazide (HYDRODIURIL) 25 MG tablet TAKE ONE TABLET BY MOUTH EVERY DAY 30 tablet 5  . losartan (COZAAR) 50 MG tablet Take 1 tablet (50 mg total) by mouth daily. 30 tablet 7  . warfarin (COUMADIN) 5 MG tablet Take 1  tablet (5 mg total) by mouth as directed. 150 tablet 1   No current facility-administered medications for this visit.    Allergies  Allergen Reactions  . Oxycodone   . Sertraline Hcl     History   Social History  . Marital Status: Widowed    Spouse Name: N/A    Number of Children: 1  . Years of Education: N/A   Occupational History  . Retired Administrator    Social History Main Topics  . Smoking status: Never Smoker   . Smokeless tobacco: Not on file  . Alcohol Use: No  . Drug Use: No  . Sexually Active: No   Other Topics Concern  . Not on file   Social History Narrative  . No narrative on file    Family History  Problem Relation Age of Onset  . Heart attack Father     Physical Exam:                   Filed Vitals:   08/17/15 0631  BP: 149/92  Pulse: 73  Temp: 98 F (36.7 C)  Resp: 20   GEN- The patient is overweight appearing, alert and oriented x 3 today.  Head- normocephalic, atraumatic Eyes- Sclera clear, conjunctiva pink Ears- hearing intact Oropharynx- clear Neck- supple, no JVP Lymph- no cervical lymphadenopathy Lungs- Clear to ausculation bilaterally, normal work of breathing Chest- pacemaker pocket is well healed Heart- Regular rate and rhythm (V paced) GI- soft, NT,  ND, + BS Extremities- no clubbing, cyanosis,+1 edema  Echo and labs reviewed EF 55%, INR 1.4   Assessment and Plan:  1. Complete heart block He has reached ERI battery status Risks, benefits, and alternatives to pacemaker pulse generator replacement were discussed in detail today.  The patient understands that risks include but are not limited to bleeding, infection, pneumothorax, perforation, tamponade, vascular damage, renal failure, MI, stroke, death, damage to his existing leads, and lead dislodgement and wishes to proceed at this time.  2. Chronic systolic dysfunction EF has  normalized No indication for device upgrade to CRT or ICD  3. Permanent afib Continue long term anticoagulation  4. Obesity Weight loss is strongly advised  Thompson Grayer MD, [facility] 08/17/2015 7:33 AM

## 2015-08-20 MED FILL — Lidocaine HCl Local Preservative Free (PF) Inj 1%: INTRAMUSCULAR | Qty: 30 | Status: AC

## 2015-08-21 ENCOUNTER — Encounter: Payer: Self-pay | Admitting: Internal Medicine

## 2015-08-27 ENCOUNTER — Ambulatory Visit (INDEPENDENT_AMBULATORY_CARE_PROVIDER_SITE_OTHER): Payer: Medicare Other | Admitting: *Deleted

## 2015-08-27 DIAGNOSIS — Z950 Presence of cardiac pacemaker: Secondary | ICD-10-CM | POA: Diagnosis not present

## 2015-08-27 LAB — CUP PACEART INCLINIC DEVICE CHECK
Battery Impedance: 100 Ohm
Battery Remaining Longevity: 142 mo
Battery Voltage: 2.8 V
Brady Statistic RV Percent Paced: 99 %
Date Time Interrogation Session: 20161010153012
Lead Channel Impedance Value: 484 Ohm
Lead Channel Impedance Value: 67 Ohm
Lead Channel Pacing Threshold Amplitude: 0.75 V
Lead Channel Pacing Threshold Pulse Width: 0.4 ms
Lead Channel Sensing Intrinsic Amplitude: 11.2 mV
Lead Channel Setting Pacing Amplitude: 2.5 V
Lead Channel Setting Pacing Pulse Width: 0.4 ms
Lead Channel Setting Sensing Sensitivity: 4 mV

## 2015-08-27 NOTE — Progress Notes (Signed)
Wound check appointment s/p generator replacement 08/17/15. Steri-strips removed. Wound without redness or edema. Incision edges approximated, wound well healed. Normal device function. Thresholds, sensing, and impedances consistent with implant measurements. Histogram distribution relatively flat- ADL response increased from 3 to 4. Pt reported improved breathing during hall walk. No high ventricular rates noted. Patient educated about wound care. ROV with JA 11/21/15.

## 2015-09-10 ENCOUNTER — Encounter: Payer: Self-pay | Admitting: Internal Medicine

## 2015-11-05 ENCOUNTER — Other Ambulatory Visit (HOSPITAL_COMMUNITY): Payer: Self-pay | Admitting: Internal Medicine

## 2015-11-05 DIAGNOSIS — R0602 Shortness of breath: Secondary | ICD-10-CM

## 2015-11-05 DIAGNOSIS — I509 Heart failure, unspecified: Secondary | ICD-10-CM

## 2015-11-08 ENCOUNTER — Other Ambulatory Visit: Payer: Self-pay | Admitting: Internal Medicine

## 2015-11-15 ENCOUNTER — Ambulatory Visit (HOSPITAL_COMMUNITY): Payer: Medicare Other | Attending: Cardiovascular Disease

## 2015-11-15 ENCOUNTER — Other Ambulatory Visit: Payer: Self-pay

## 2015-11-15 ENCOUNTER — Other Ambulatory Visit (HOSPITAL_COMMUNITY): Payer: Self-pay | Admitting: Internal Medicine

## 2015-11-15 DIAGNOSIS — I517 Cardiomegaly: Secondary | ICD-10-CM | POA: Diagnosis not present

## 2015-11-15 DIAGNOSIS — I509 Heart failure, unspecified: Secondary | ICD-10-CM | POA: Diagnosis not present

## 2015-11-15 DIAGNOSIS — R0602 Shortness of breath: Secondary | ICD-10-CM | POA: Diagnosis not present

## 2015-11-15 DIAGNOSIS — I10 Essential (primary) hypertension: Secondary | ICD-10-CM | POA: Insufficient documentation

## 2015-11-15 DIAGNOSIS — Z8249 Family history of ischemic heart disease and other diseases of the circulatory system: Secondary | ICD-10-CM | POA: Diagnosis not present

## 2015-11-21 ENCOUNTER — Encounter: Payer: Self-pay | Admitting: Internal Medicine

## 2015-11-21 ENCOUNTER — Ambulatory Visit (INDEPENDENT_AMBULATORY_CARE_PROVIDER_SITE_OTHER): Payer: Medicare Other | Admitting: Internal Medicine

## 2015-11-21 VITALS — BP 112/82 | HR 87 | Ht 72.0 in | Wt 335.8 lb

## 2015-11-21 DIAGNOSIS — I5022 Chronic systolic (congestive) heart failure: Secondary | ICD-10-CM | POA: Diagnosis not present

## 2015-11-21 DIAGNOSIS — I442 Atrioventricular block, complete: Secondary | ICD-10-CM | POA: Diagnosis not present

## 2015-11-21 DIAGNOSIS — I482 Chronic atrial fibrillation: Secondary | ICD-10-CM | POA: Diagnosis not present

## 2015-11-21 DIAGNOSIS — I4821 Permanent atrial fibrillation: Secondary | ICD-10-CM

## 2015-11-21 NOTE — Patient Instructions (Signed)
Medication Instructions:  Your physician recommends that you continue on your current medications as directed. Please refer to the Current Medication list given to you today.   Labwork: None ordered   Testing/Procedures: None ordered   Follow-Up: Remote monitoring is used to monitor your Pacemaker  from home. This monitoring reduces the number of office visits required to check your device to one time per year. It allows [place] to keep an eye on the functioning of your device to ensure it is working properly. You are scheduled for a device check from home on 02/20/16. You may send your transmission at any time that day. If you have a wireless device, the transmission will be sent automatically. After your physician reviews your transmission, you will receive a postcard with your next transmission date.  Your physician recommends that you schedule a follow-up appointment as scheduled with Truitt Merle, NP and see Dr Rayann Heman in 12 months      Any Other Special Instructions Will Be Listed Below (If Applicable).     If you need a refill on your cardiac medications before your next appointment, please call your pharmacy.

## 2015-11-21 NOTE — Progress Notes (Signed)
PCP:  [name], MD  The patient presents today for routine electrophysiology followup.  Since his recent gen change, the patient reports doing reasonably well.  He has had chronic difficulty with obesity and poor exercise tolerance.  He has some edema.  Today, he denies symptoms of palpitations, chest pain, dizziness, presyncope, syncope, or neurologic sequela.  The patient feels that he is tolerating medications without difficulties and is otherwise without complaint today.   Past Medical History  Diagnosis Date  . Permanent atrial fibrillation ([place])     failed cardioversion in April of 2010  . Chronic anticoagulation   . Morbid obesity ([place])   . Osteoarthritis   . HTN (hypertension)   . 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 Urosepsis   . Prostate CA ([place])   . Nephrolithiasis   . Depression   . Gout   . Arthritis   . LV dysfunction     EF down to 30% per echo Feb 2013 -  started on ARB and low dose beta blocker  . History of echocardiogram     Echo 9/16:  EF 55-60%, no RWMA, mild MR, mild LAE, trivial PI   Past Surgical History  Procedure Laterality Date  . Pacemaker placement  2010    Medtronic  . Total knee arthroplasty    . Radioactive seed implant    . Shoulder surgery    . Cardioversion  02/2009    failed  . Ep implantable device N/A 08/17/2015    Procedure: PPM Generator Changeout;  Surgeon: Thompson Grayer, MD;  Location: [facility];  Service: Cardiovascular;  Laterality: N/A;    Current Outpatient Prescriptions  Medication Sig Dispense Refill  . allopurinol (ZYLOPRIM) 300 MG tablet TAKE ONE TABLET BY MOUTH ONCE DAILY (Patient taking differently: TAKE 300 MG BY MOUTH ONCE DAILY) 30 tablet 0  . carvedilol (COREG) 3.125 MG tablet TAKE ONE TABLET BY MOUTH TWICE DAILY 180 tablet 2  . celecoxib (CELEBREX) 200 MG capsule Take 200 mg by mouth every other day.      . clotrimazole-betamethasone (LOTRISONE) cream Apply 1 application topically daily as needed (skin irritation).      . hydrochlorothiazide (HYDRODIURIL) 25 MG tablet Take 1 tablet (25 mg total) by mouth daily. 30 tablet 5  . losartan (COZAAR) 50 MG tablet TAKE ONE TABLET BY MOUTH ONCE DAILY (Patient taking differently: Take 50 mg by mouth daily. ) 90 tablet 3  . metFORMIN (GLUCOPHAGE) 500 MG tablet Take 500 mg by mouth daily with breakfast.     . pravastatin (PRAVACHOL) 20 MG tablet Take 20 mg by mouth daily.     Marland Kitchen triamcinolone (KENALOG) 0.025 % cream Apply 1 application topically daily as needed (skin irritation).     . warfarin (COUMADIN) 5 MG tablet Take as directed by coumadin clinic (Patient taking differently: Take 7.5-10 mg by mouth daily. Take 10 mg by mouth on Sunday and take 7.5 mg by mouth on all other days) 150 tablet 1   No current facility-administered medications for this visit.    Allergies  Allergen Reactions  . Oxycodone Other (See Comments)    Makes feel funny  . Sertraline Hcl Other (See Comments)    Makes feel funny    Social History   Social History  . Marital Status: Widowed    Spouse Name: N/A  . Number of Children: 1  . Years of Education: N/A   Occupational History  . Retired Administrator    Social History Main Topics  .  Smoking status: Never Smoker   . Smokeless tobacco: Not on file  . Alcohol Use: No  . Drug Use: No  . Sexual Activity: No   Other Topics Concern  . Not on file   Social History Narrative    Family History  Problem Relation Age of Onset  . Heart attack Father 52  . Healthy Mother   . Healthy Brother     Physical Exam: Filed Vitals:   11/21/15 1421  BP: 112/82  Pulse: 87  Height: 6' (1.829 m)  Weight: 335 lb 12.8 oz (152.318 kg)    GEN- The patient is overweight and frail appearing, alert and oriented x 3 today.   Head- normocephalic, atraumatic Eyes-  Sclera clear, conjunctiva pink Ears- hearing intact Oropharynx- clear Neck- supple,   Lungs- Clear to ausculation bilaterally, normal work of breathing Chest- pacemaker  pocket is well healed Heart- Regular rate and rhythm (V paced) GI- soft, NT, ND, + BS Extremities- no clubbing, cyanosis,+1 edema  Pacemaker interrogation- reviewed in detail today,  See PACEART report  Assessment and Plan:  1. Complete heart block Normal pacemaker function See Pace Art report Exercise response increased from 3 to 4. No other changes today  2. Chronic systolic dysfunction Stable No change required today  3. Permanent afib Continue long term anticoagulation  4. Obesity Weight loss is strongly advised  carelink Follow-up with Cecille Rubin as scheduled and then every 6 months I will see when needed  Thompson Grayer MD, [facility] 11/21/2015 2:40 PM

## 2015-11-23 LAB — CUP PACEART INCLINIC DEVICE CHECK
Battery Remaining Longevity: 142 mo
Brady Statistic RV Percent Paced: 100 %
Implantable Lead Implant Date: 20100125
Implantable Lead Location: 753860
Implantable Lead Model: 4471
Implantable Lead Serial Number: 475977
Lead Channel Pacing Threshold Amplitude: 0.875 V
Lead Channel Pacing Threshold Pulse Width: 0.4 ms
Lead Channel Pacing Threshold Pulse Width: 0.4 ms
Lead Channel Setting Pacing Pulse Width: 0.4 ms
Lead Channel Setting Sensing Sensitivity: 4 mV
MDC IDC LEAD IMPLANT DT: 20100125
MDC IDC LEAD LOCATION: 753859
MDC IDC LEAD SERIAL: 639547
MDC IDC MSMT BATTERY IMPEDANCE: 100 Ohm
MDC IDC MSMT BATTERY VOLTAGE: 2.8 V
MDC IDC MSMT LEADCHNL RA IMPEDANCE VALUE: 67 Ohm
MDC IDC MSMT LEADCHNL RV IMPEDANCE VALUE: 508 Ohm
MDC IDC MSMT LEADCHNL RV PACING THRESHOLD AMPLITUDE: 1 V
MDC IDC SESS DTM: 20170104193005
MDC IDC SET LEADCHNL RV PACING AMPLITUDE: 2.5 V

## 2015-12-28 ENCOUNTER — Other Ambulatory Visit: Payer: Self-pay | Admitting: Nurse Practitioner

## 2016-01-08 ENCOUNTER — Ambulatory Visit (INDEPENDENT_AMBULATORY_CARE_PROVIDER_SITE_OTHER): Payer: Medicare Other | Admitting: Nurse Practitioner

## 2016-01-08 ENCOUNTER — Encounter: Payer: Self-pay | Admitting: Nurse Practitioner

## 2016-01-08 VITALS — BP 100/70 | HR 72 | Ht 72.0 in | Wt 336.0 lb

## 2016-01-08 DIAGNOSIS — I482 Chronic atrial fibrillation: Secondary | ICD-10-CM

## 2016-01-08 DIAGNOSIS — Z950 Presence of cardiac pacemaker: Secondary | ICD-10-CM | POA: Diagnosis not present

## 2016-01-08 DIAGNOSIS — I4821 Permanent atrial fibrillation: Secondary | ICD-10-CM

## 2016-01-08 NOTE — Patient Instructions (Addendum)
We will be checking the following labs today - NONE   Medication Instructions:    Continue with your current medicines.     Testing/Procedures To Be Arranged:  N/A  Follow-Up:   See me in one year with pacemaker check    Other Special Instructions:   Think about what we talked about today :)    If you need a refill on your cardiac medications before your next appointment, please call your pharmacy.   Call the [place] office at [phone] if you have any questions, problems or concerns.

## 2016-01-08 NOTE — Progress Notes (Signed)
CARDIOLOGY OFFICE NOTE  Date:  01/08/2016    [name] Date of Birth: 1941/05/20 Medical Record [id number]  PCP:  [name], MD  Cardiologist:  [place]    Chief Complaint  Patient presents with  . Atrial Fibrillation    Follow up visit - seen for [name]    History of Present Illness: [name] is a 75 y.o. male who presents today for a follow up visit. One month check for [name]. 6 month check for me.   He has HTN, morbid obesity (with unsustained weight loss), chronic atrial fib on coumadin and has a pacemaker in place. EF 35% per echo from March of 2013. He has been very reluctant to take more medicines and has not wanted a lot done for him - this has been chronic over many years. Dr. Virgina Jock checks his coumadin.   I last saw him back in August - device was at [facility] - he was more short of breath. Echo updated and EF noted to have improved. He did well with his generator replacement.   Comes back today. Here with his significant other. He is doing ok. Has had recent URI - sounds like he almost got hospitalized. Saw Dr. Virgina Jock (on his own - which is quite unusual). Better now with resolving cough. No chest pain. He has lost weight - from the recent illness - but still drinking lots of Dr. [name]. This has always been his downfall. No swelling. Tolerating his medicines.   Past Medical History  Diagnosis Date  . Permanent atrial fibrillation ([name])     failed cardioversion in April of 2010  . Chronic anticoagulation   . Morbid obesity ([name])   . Osteoarthritis   . HTN (hypertension)   . 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 Urosepsis   . Prostate CA ([place])   . Nephrolithiasis   . Depression   . Gout   . Arthritis   . LV dysfunction     EF down to 30% per echo Feb 2013 -  started on ARB and low dose beta blocker  . History of echocardiogram     Echo 9/16:  EF 55-60%, no RWMA, mild MR, mild LAE, trivial PI    Past Surgical History  Procedure Laterality Date  .  Pacemaker placement  2010    Medtronic  . Total knee arthroplasty    . Radioactive seed implant    . Shoulder surgery    . Cardioversion  02/2009    failed  . Ep implantable device N/A 08/17/2015    Procedure: PPM Generator Changeout;  Surgeon: Thompson Grayer, MD;  Location: [facility];  Service: Cardiovascular;  Laterality: N/A;     Medications: Current Outpatient Prescriptions  Medication Sig Dispense Refill  . ALBUTEROL IN Inhale 1 Dose into the lungs 2 (two) times daily.    Marland Kitchen allopurinol (ZYLOPRIM) 300 MG tablet TAKE ONE TABLET BY MOUTH ONCE DAILY (Patient taking differently: TAKE 300 MG BY MOUTH ONCE DAILY) 30 tablet 0  . carvedilol (COREG) 3.125 MG tablet TAKE ONE TABLET BY MOUTH TWICE DAILY 180 tablet 2  . cefdinir (OMNICEF) 300 MG capsule Take 300 mg by mouth 2 (two) times daily.     . celecoxib (CELEBREX) 200 MG capsule Take 200 mg by mouth every other day.      . clotrimazole-betamethasone (LOTRISONE) cream Apply 1 application topically daily as needed (skin irritation).     . hydrochlorothiazide (HYDRODIURIL) 25 MG tablet Take  1 tablet (25 mg total) by mouth daily. 30 tablet 5  . losartan (COZAAR) 50 MG tablet TAKE ONE TABLET BY MOUTH ONCE DAILY 90 tablet 2  . metFORMIN (GLUCOPHAGE) 500 MG tablet Take 500 mg by mouth daily with breakfast.     . pravastatin (PRAVACHOL) 20 MG tablet Take 20 mg by mouth daily.     Marland Kitchen triamcinolone (KENALOG) 0.025 % cream Apply 1 application topically daily as needed (skin irritation).     . warfarin (COUMADIN) 5 MG tablet Take as directed by coumadin clinic (Patient taking differently: Take 7.5-10 mg by mouth daily. Take 10 mg by mouth on Sunday and take 7.5 mg by mouth on all other days) 150 tablet 1   No current facility-administered medications for this visit.    Allergies: Allergies  Allergen Reactions  . Oxycodone Other (See Comments)    Makes feel funny  . Sertraline Hcl Other (See Comments)    Makes feel funny    Social  History: The patient  reports that he has never smoked. He does not have any smokeless tobacco history on file. He reports that he does not drink alcohol or use illicit drugs.   Family History: The patient's family history includes Healthy in his brother and mother; Heart attack (age of onset: 65) in his father.   Review of Systems: Please see the history of present illness.   Otherwise, the review of systems is positive for none.   All other systems are reviewed and negative.   Physical Exam: VS:  BP 100/70 mmHg  Pulse 72  Ht 6' (1.829 m)  Wt 336 lb (152.409 kg)  BMI 45.56 kg/m2 .  BMI Body mass index is 45.56 kg/(m^2).  Wt Readings from Last 3 Encounters:  01/08/16 336 lb (152.409 kg)  11/21/15 335 lb 12.8 oz (152.318 kg)  08/17/15 350 lb (158.759 kg)    General: Pleasant. He remains morbidly obese but alert and in no acute distress.  HEENT: Normal. Neck: Supple, no JVD, carotid bruits, or masses noted.  Cardiac: Irregular irregular rhythm. Rate is ok. No murmurs, rubs, or gallops. No edema but legs full.  Respiratory:  Lungs are fairly clear to auscultation bilaterally with normal work of breathing.  GI: Soft and nontender.  MS: No deformity or atrophy. Gait and ROM intact. Skin: Warm and dry. Color is normal.  Neuro:  Strength and sensation are intact and no gross focal deficits noted.  Psych: Alert, appropriate and with normal affect.   LABORATORY DATA:  EKG:  EKG is not ordered today.  Lab Results  Component Value Date   WBC 7.0 08/17/2015   HGB 14.7 08/17/2015   HCT 43.8 08/17/2015   PLT 182 08/17/2015   GLUCOSE 181* 08/17/2015   CHOL  09/15/2008    126        ATP III CLASSIFICATION:  <200     mg/dL   Desirable  200-239  mg/dL   Borderline High  >=240    mg/dL   High   TRIG 181* 09/15/2008   HDL 11* 09/15/2008   LDLCALC  09/15/2008    79        Total Cholesterol/HDL:CHD Risk Coronary Heart Disease Risk Table                     Men   Women  1/2 Average  Risk   3.4   3.3   ALT 21 11/03/2008   AST 19 11/03/2008   NA 136 08/17/2015  K 3.6 08/17/2015   CL 99* 08/17/2015   CREATININE 1.22 08/17/2015   BUN 24* 08/17/2015   CO2 28 08/17/2015   TSH 1.487 Test methodology is 3rd generation TSH 09/15/2008   INR 1.41 08/17/2015   HGBA1C * 09/15/2008    6.3 (NOTE)   The ADA recommends the following therapeutic goal for glycemic   control related to Hgb A1C measurement:   Goal of Therapy:   < 7.0% Hgb A1C   Reference: American Diabetes Association: Clinical Practice   Recommendations 2008, Diabetes Care,  2008, 31:(Suppl 1).    BNP (last 3 results) No results for input(s): BNP in the last 8760 hours.  ProBNP (last 3 results)  Recent Labs  07/13/15 1218  PROBNP 66.0     Other Studies Reviewed Today:  Echo Study Conclusions from 10/2015  - Left ventricle: The cavity size was normal. Wall thickness was normal. Systolic function was normal. The estimated ejection fraction was in the range of 50% to 55%. - Left atrium: The atrium was mildly dilated. - Atrial septum: No defect or patent foramen ovale was identified.   Assessment/Plan: 1. Chronic systolic HF - EF has actually improved on last study - now 50 to 55%. He is felt to be stable from our standpoint.   2. Chronic atrial fib - managed with rate control and coumadin. INR checked by PCP.   3. Morbid obesity - still the crux of his issues. I do not see this changing but have tried to be encouraging - his weight is down and I have asked him to try and continue his effort.   4. Underlying PPM - recent generator change - followed by [name].   Current medicines are reviewed with the patient today.  The patient does not have concerns regarding medicines other than what has been noted above.  The following changes have been made:  See above.  Labs/ tests ordered today include:   No orders of the defined types were placed in this encounter.     Disposition:   FU with me  in 12  monthswith device check.     Patient is agreeable to this plan and will call if any problems develop in the interim.   Signed: Burtis Junes, RN, ANP-C 01/08/2016 1:55 PM  [facility] [address] [place], [place]  [address] Phone: [phone] Fax: [phone]

## 2016-01-28 ENCOUNTER — Other Ambulatory Visit: Payer: Self-pay | Admitting: Internal Medicine

## 2016-02-20 ENCOUNTER — Encounter: Payer: Medicare Other | Admitting: *Deleted

## 2016-02-22 ENCOUNTER — Encounter: Payer: Self-pay | Admitting: Cardiology

## 2016-03-04 ENCOUNTER — Ambulatory Visit (INDEPENDENT_AMBULATORY_CARE_PROVIDER_SITE_OTHER): Payer: Medicare Other | Admitting: *Deleted

## 2016-03-04 DIAGNOSIS — I442 Atrioventricular block, complete: Secondary | ICD-10-CM | POA: Diagnosis not present

## 2016-03-04 DIAGNOSIS — Z950 Presence of cardiac pacemaker: Secondary | ICD-10-CM

## 2016-03-05 NOTE — Progress Notes (Signed)
Remote pacemaker transmission.   

## 2016-03-25 ENCOUNTER — Emergency Department (HOSPITAL_COMMUNITY)
Admission: EM | Admit: 2016-03-25 | Discharge: 2016-03-25 | Disposition: A | Discharge status: Home / Self Care | Payer: Medicare Other | Attending: Emergency Medicine | Admitting: Emergency Medicine

## 2016-03-25 ENCOUNTER — Emergency Department (HOSPITAL_COMMUNITY): Payer: Medicare Other

## 2016-03-25 ENCOUNTER — Encounter (HOSPITAL_COMMUNITY): Payer: Self-pay

## 2016-03-25 DIAGNOSIS — Z87442 Personal history of urinary calculi: Secondary | ICD-10-CM | POA: Insufficient documentation

## 2016-03-25 DIAGNOSIS — Y92007 Garden or yard of unspecified non-institutional (private) residence as the place of occurrence of the external cause: Secondary | ICD-10-CM | POA: Insufficient documentation

## 2016-03-25 DIAGNOSIS — Z79899 Other long term (current) drug therapy: Secondary | ICD-10-CM | POA: Diagnosis not present

## 2016-03-25 DIAGNOSIS — I10 Essential (primary) hypertension: Secondary | ICD-10-CM | POA: Insufficient documentation

## 2016-03-25 DIAGNOSIS — M1990 Unspecified osteoarthritis, unspecified site: Secondary | ICD-10-CM | POA: Diagnosis not present

## 2016-03-25 DIAGNOSIS — Z950 Presence of cardiac pacemaker: Secondary | ICD-10-CM | POA: Insufficient documentation

## 2016-03-25 DIAGNOSIS — Z8659 Personal history of other mental and behavioral disorders: Secondary | ICD-10-CM | POA: Insufficient documentation

## 2016-03-25 DIAGNOSIS — M109 Gout, unspecified: Secondary | ICD-10-CM | POA: Insufficient documentation

## 2016-03-25 DIAGNOSIS — Z792 Long term (current) use of antibiotics: Secondary | ICD-10-CM | POA: Insufficient documentation

## 2016-03-25 DIAGNOSIS — Y9389 Activity, other specified: Secondary | ICD-10-CM | POA: Diagnosis not present

## 2016-03-25 DIAGNOSIS — S0101XA Laceration without foreign body of scalp, initial encounter: Secondary | ICD-10-CM | POA: Diagnosis not present

## 2016-03-25 DIAGNOSIS — Z7984 Long term (current) use of oral hypoglycemic drugs: Secondary | ICD-10-CM | POA: Diagnosis not present

## 2016-03-25 DIAGNOSIS — S0990XA Unspecified injury of head, initial encounter: Secondary | ICD-10-CM

## 2016-03-25 DIAGNOSIS — Z791 Long term (current) use of non-steroidal anti-inflammatories (NSAID): Secondary | ICD-10-CM | POA: Diagnosis not present

## 2016-03-25 DIAGNOSIS — Z8546 Personal history of malignant neoplasm of prostate: Secondary | ICD-10-CM | POA: Diagnosis not present

## 2016-03-25 DIAGNOSIS — Z7901 Long term (current) use of anticoagulants: Secondary | ICD-10-CM | POA: Diagnosis not present

## 2016-03-25 DIAGNOSIS — Z23 Encounter for immunization: Secondary | ICD-10-CM | POA: Diagnosis not present

## 2016-03-25 DIAGNOSIS — W208XXA Other cause of strike by thrown, projected or falling object, initial encounter: Secondary | ICD-10-CM | POA: Insufficient documentation

## 2016-03-25 DIAGNOSIS — Y998 Other external cause status: Secondary | ICD-10-CM | POA: Insufficient documentation

## 2016-03-25 DIAGNOSIS — Z8619 Personal history of other infectious and parasitic diseases: Secondary | ICD-10-CM | POA: Insufficient documentation

## 2016-03-25 DIAGNOSIS — I482 Chronic atrial fibrillation: Secondary | ICD-10-CM | POA: Diagnosis not present

## 2016-03-25 LAB — CBC WITH DIFFERENTIAL/PLATELET
Basophils Absolute: 0 10*3/uL (ref 0.0–0.1)
Basophils Relative: 0 %
EOS ABS: 0.1 10*3/uL (ref 0.0–0.7)
Eosinophils Relative: 2 %
HCT: 42 % (ref 39.0–52.0)
Hemoglobin: 14.3 g/dL (ref 13.0–17.0)
LYMPHS ABS: 1.8 10*3/uL (ref 0.7–4.0)
LYMPHS PCT: 26 %
MCH: 31 pg (ref 26.0–34.0)
MCHC: 34 g/dL (ref 30.0–36.0)
MCV: 90.9 fL (ref 78.0–100.0)
MONOS PCT: 6 %
Monocytes Absolute: 0.4 10*3/uL (ref 0.1–1.0)
NEUTROS ABS: 4.5 10*3/uL (ref 1.7–7.7)
NEUTROS PCT: 66 %
PLATELETS: 219 10*3/uL (ref 150–400)
RBC: 4.62 MIL/uL (ref 4.22–5.81)
RDW: 14.1 % (ref 11.5–15.5)
WBC: 6.8 10*3/uL (ref 4.0–10.5)

## 2016-03-25 LAB — COMPREHENSIVE METABOLIC PANEL
ALT: 16 U/L — ABNORMAL LOW (ref 17–63)
ANION GAP: 12 (ref 5–15)
AST: 21 U/L (ref 15–41)
Albumin: 3.7 g/dL (ref 3.5–5.0)
Alkaline Phosphatase: 61 U/L (ref 38–126)
BUN: 21 mg/dL — ABNORMAL HIGH (ref 6–20)
CHLORIDE: 103 mmol/L (ref 101–111)
CO2: 24 mmol/L (ref 22–32)
Calcium: 9.4 mg/dL (ref 8.9–10.3)
Creatinine, Ser: 1.62 mg/dL — ABNORMAL HIGH (ref 0.61–1.24)
GFR, EST AFRICAN AMERICAN: 47 mL/min — AB (ref >60)
GFR, EST NON AFRICAN AMERICAN: 40 mL/min — AB (ref >60)
Glucose, Bld: 209 mg/dL — ABNORMAL HIGH (ref 65–99)
POTASSIUM: 3.7 mmol/L (ref 3.5–5.1)
SODIUM: 139 mmol/L (ref 135–145)
Total Bilirubin: 0.7 mg/dL (ref 0.3–1.2)
Total Protein: 6.5 g/dL (ref 6.5–8.1)

## 2016-03-25 LAB — PROTIME-INR
INR: 2.47 — ABNORMAL HIGH (ref 0.00–1.49)
Prothrombin Time: 26.5 seconds — ABNORMAL HIGH (ref 11.6–15.2)

## 2016-03-25 MED ORDER — LIDOCAINE HCL (PF) 1 % IJ SOLN: 5.0000 mL | Freq: Once | INTRAMUSCULAR | Status: DC

## 2016-03-25 MED ORDER — LIDOCAINE-EPINEPHRINE-TETRACAINE (LET) SOLUTION: 3.0000 mL | Freq: Once | NASAL | Status: DC

## 2016-03-25 MED ORDER — TETANUS-DIPHTH-ACELL PERTUSSIS 5-2.5-18.5 LF-MCG/0.5 IM SUSP
0.5000 mL | Freq: Once | INTRAMUSCULAR | Status: AC
  Administered 2016-03-25: 0.5 mL via INTRAMUSCULAR
  Filled 2016-03-25: qty 0.5

## 2016-03-25 NOTE — Discharge Instructions (Signed)
The fact that you take a blood thinner makes you prone to have a delayed bleed inside your brain, if you have change in your vision, nausea vomiting, severe headache, difficulty talking or walking or weakness on one side of her body, call 911 and present immediately back to the emergency department.  The staple will need to be removed in 10 days, even go to urgent care, the emergency department or your primary care physician may be able to take it out, call to make sure they can remove staples in your primary care office. Concussion, Adult A concussion, or closed-head injury, is a brain injury caused by a direct blow to the head or by a quick and sudden movement (jolt) of the head or neck. Concussions are usually not life-threatening. Even so, the effects of a concussion can be serious. If you have had a concussion before, you are more likely to experience concussion-like symptoms after a direct blow to the head.  CAUSES  Direct blow to the head, such as from running into another player during a soccer game, being hit in a fight, or hitting your head on a hard surface.  A jolt of the head or neck that causes the brain to move back and forth inside the skull, such as in a car crash. SIGNS AND SYMPTOMS The signs of a concussion can be hard to notice. Early on, they may be missed by you, family members, and health care providers. You may look fine but act or feel differently. Symptoms are usually temporary, but they may last for days, weeks, or even longer. Some symptoms may appear right away while others may not show up for hours or days. Every head injury is different. Symptoms include:  Mild to moderate headaches that will not go away.  A feeling of pressure inside your head.  Having more trouble than usual:  Learning or remembering things you have heard.  Answering questions.  Paying attention or concentrating.  Organizing daily tasks.  Making decisions and solving problems.  Slowness  in thinking, acting or reacting, speaking, or reading.  Getting lost or being easily confused.  Feeling tired all the time or lacking energy (fatigued).  Feeling drowsy.  Sleep disturbances.  Sleeping more than usual.  Sleeping less than usual.  Trouble falling asleep.  Trouble sleeping (insomnia).  Loss of balance or feeling lightheaded or dizzy.  Nausea or vomiting.  Numbness or tingling.  Increased sensitivity to:  Sounds.  Lights.  Distractions.  Vision problems or eyes that tire easily.  Diminished sense of taste or smell.  Ringing in the ears.  Mood changes such as feeling sad or anxious.  Becoming easily irritated or angry for little or no reason.  Lack of motivation.  Seeing or hearing things other people do not see or hear (hallucinations). DIAGNOSIS Your health care provider can usually diagnose a concussion based on a description of your injury and symptoms. He or she will ask whether you passed out (lost consciousness) and whether you are having trouble remembering events that happened right before and during your injury. Your evaluation might include:  A brain scan to look for signs of injury to the brain. Even if the test shows no injury, you may still have a concussion.  Blood tests to be sure other problems are not present. TREATMENT  Concussions are usually treated in an emergency department, in urgent care, or at a clinic. You may need to stay in the hospital overnight for further treatment.  Tell  your health care provider if you are taking any medicines, including prescription medicines, over-the-counter medicines, and natural remedies. Some medicines, such as blood thinners (anticoagulants) and aspirin, may increase the chance of complications. Also tell your health care provider whether you have had alcohol or are taking illegal drugs. This information may affect treatment.  Your health care provider will send you home with important  instructions to follow.  How fast you will recover from a concussion depends on many factors. These factors include how severe your concussion is, what part of your brain was injured, your age, and how healthy you were before the concussion.  Most people with mild injuries recover fully. Recovery can take time. In general, recovery is slower in older persons. Also, persons who have had a concussion in the past or have other medical problems may find that it takes longer to recover from their current injury. HOME CARE INSTRUCTIONS General Instructions  Carefully follow the directions your health care provider gave you.  Only take over-the-counter or prescription medicines for pain, discomfort, or fever as directed by your health care provider.  Take only those medicines that your health care provider has approved.  Do not drink alcohol until your health care provider says you are well enough to do so. Alcohol and certain other drugs may slow your recovery and can put you at risk of further injury.  If it is harder than usual to remember things, write them down.  If you are easily distracted, try to do one thing at a time. For example, do not try to watch TV while fixing dinner.  Talk with family members or close friends when making important decisions.  Keep all follow-up appointments. Repeated evaluation of your symptoms is recommended for your recovery.  Watch your symptoms and tell others to do the same. Complications sometimes occur after a concussion. Older adults with a brain injury may have a higher risk of serious complications, such as a blood clot on the brain.  Tell your teachers, school nurse, school counselor, coach, athletic trainer, or work Freight forwarder about your injury, symptoms, and restrictions. Tell them about what you can or cannot do. They should watch for:  Increased problems with attention or concentration.  Increased difficulty remembering or learning new  information.  Increased time needed to complete tasks or assignments.  Increased irritability or decreased ability to cope with stress.  Increased symptoms.  Rest. Rest helps the brain to heal. Make sure you:  Get plenty of sleep at night. Avoid staying up late at night.  Keep the same bedtime hours on weekends and weekdays.  Rest during the day. Take daytime naps or rest breaks when you feel tired.  Limit activities that require a lot of thought or concentration. These include:  Doing homework or job-related work.  Watching TV.  Working on the computer.  Avoid any situation where there is potential for another head injury (football, hockey, soccer, basketball, martial arts, downhill snow sports and horseback riding). Your condition will get worse every time you experience a concussion. You should avoid these activities until you are evaluated by the appropriate follow-up health care providers. Returning To Your Regular Activities You will need to return to your normal activities slowly, not all at once. You must give your body and brain enough time for recovery.  Do not return to sports or other athletic activities until your health care provider tells you it is safe to do so.  Ask your health care provider when you  can drive, ride a bicycle, or operate heavy machinery. Your ability to react may be slower after a brain injury. Never do these activities if you are dizzy.  Ask your health care provider about when you can return to work or school. Preventing Another Concussion It is very important to avoid another brain injury, especially before you have recovered. In rare cases, another injury can lead to permanent brain damage, brain swelling, or death. The risk of this is greatest during the first 7-10 days after a head injury. Avoid injuries by:  Wearing a seat belt when riding in a car.  Drinking alcohol only in moderation.  Wearing a helmet when biking, skiing,  skateboarding, skating, or doing similar activities.  Avoiding activities that could lead to a second concussion, such as contact or recreational sports, until your health care provider says it is okay.  Taking safety measures in your home.  Remove clutter and tripping hazards from floors and stairways.  Use grab bars in bathrooms and handrails by stairs.  Place non-slip mats on floors and in bathtubs.  Improve lighting in dim areas. SEEK MEDICAL CARE IF:  You have increased problems paying attention or concentrating.  You have increased difficulty remembering or learning new information.  You need more time to complete tasks or assignments than before.  You have increased irritability or decreased ability to cope with stress.  You have more symptoms than before. Seek medical care if you have any of the following symptoms for more than 2 weeks after your injury:  Lasting (chronic) headaches.  Dizziness or balance problems.  Nausea.  Vision problems.  Increased sensitivity to noise or light.  Depression or mood swings.  Anxiety or irritability.  Memory problems.  Difficulty concentrating or paying attention.  Sleep problems.  Feeling tired all the time. SEEK IMMEDIATE MEDICAL CARE IF:  You have severe or worsening headaches. These may be a sign of a blood clot in the brain.  You have weakness (even if only in one hand, leg, or part of the face).  You have numbness.  You have decreased coordination.  You vomit repeatedly.  You have increased sleepiness.  One pupil is larger than the other.  You have convulsions.  You have slurred speech.  You have increased confusion. This may be a sign of a blood clot in the brain.  You have increased restlessness, agitation, or irritability.  You are unable to recognize people or places.  You have neck pain.  It is difficult to wake you up.  You have unusual behavior changes.  You lose  consciousness. MAKE SURE YOU:  Understand these instructions.  Will watch your condition.  Will get help right away if you are not doing well or get worse.   This information is not intended to replace advice given to you by your health care provider. Make sure you discuss any questions you have with your health care provider.   Document Released: 01/24/2004 Document Revised: 11/24/2014 Document Reviewed: 05/26/2013 Elsevier Interactive Patient Education [employer].

## 2016-03-25 NOTE — ED Notes (Signed)
Pt was mowing the grass today and a rock flew and hit him on the right side of his head above right ear. Denies LOC. Small laceration noted, bleeding is controlled without bandage. Pt takes blood thinners.

## 2016-03-25 NOTE — ED Notes (Signed)
PA at bedside.

## 2016-03-25 NOTE — ED Provider Notes (Signed)
CSN: [id number]     Arrival date & time 03/25/16  1722 History   First MD Initiated Contact with Patient 03/25/16 1833     Chief Complaint  Patient presents with  . Head Injury     (Consider location/radiation/quality/duration/timing/severity/associated sxs/prior Treatment) HPI   Blood pressure 94/64, pulse 76, temperature 99.1 F (37.3 C), temperature source Oral, resp. rate 16, SpO2 94 %.  [name] is a 75 y.o. male complaining of Laceration to right mastoid area after patient was mowing the yard and a rock was thrown from the lawnmower and hit him in the head. Patient is chronically anticoagulated with Coumadin for A. fib. Patient denies loss of consciousness, change in vision, nausea, vomiting, dysarthria, ataxia, numbness, weakness, difficulty ambulating.   Past Medical History  Diagnosis Date  . Permanent atrial fibrillation ([place])     failed cardioversion in April of 2010  . Chronic anticoagulation   . Morbid obesity ([place])   . Osteoarthritis   . HTN (hypertension)   . 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 Urosepsis   . Prostate CA ([place])   . Nephrolithiasis   . Depression   . Gout   . Arthritis   . LV dysfunction     EF down to 30% per echo Feb 2013 -  started on ARB and low dose beta blocker  . History of echocardiogram     Echo 9/16:  EF 55-60%, no RWMA, mild MR, mild LAE, trivial PI   Past Surgical History  Procedure Laterality Date  . Pacemaker placement  2010    Medtronic  . Total knee arthroplasty    . Radioactive seed implant    . Shoulder surgery    . Cardioversion  02/2009    failed  . Ep implantable device N/A 08/17/2015    Procedure: PPM Generator Changeout;  Surgeon: Thompson Grayer, MD;  Location: [facility];  Service: Cardiovascular;  Laterality: N/A;   Family History  Problem Relation Age of Onset  . Heart attack Father 79  . Healthy Mother   . Healthy Brother    Social History  Substance Use Topics  . Smoking status: Never Smoker   .  Smokeless tobacco: None  . Alcohol Use: No    Review of Systems  10 systems reviewed and found to be negative, except as noted in the HPI.  Allergies  Oxycodone and Sertraline hcl  Home Medications   Prior to Admission medications   Medication Sig Start Date End Date Taking? Authorizing Provider  ALBUTEROL IN Inhale 1 Dose into the lungs 2 (two) times daily.    Historical Provider, MD  allopurinol (ZYLOPRIM) 300 MG tablet TAKE ONE TABLET BY MOUTH ONCE DAILY Patient taking differently: TAKE 300 MG BY MOUTH ONCE DAILY 07/02/15   Thayer Headings, MD  carvedilol (COREG) 3.125 MG tablet TAKE ONE TABLET BY MOUTH TWICE DAILY 11/08/15   Thompson Grayer, MD  cefdinir (OMNICEF) 300 MG capsule Take 300 mg by mouth 2 (two) times daily.  01/01/16   Historical Provider, MD  celecoxib (CELEBREX) 200 MG capsule Take 200 mg by mouth every other day.      Historical Provider, MD  clotrimazole-betamethasone (LOTRISONE) cream Apply 1 application topically daily as needed (skin irritation).  02/03/12   Historical Provider, MD  hydrochlorothiazide (HYDRODIURIL) 25 MG tablet TAKE ONE TABLET BY MOUTH ONCE DAILY 01/28/16   Thompson Grayer, MD  losartan (COZAAR) 50 MG tablet TAKE ONE TABLET BY MOUTH ONCE DAILY 12/28/15   Marlane Hatcher  Servando Snare, NP  metFORMIN (GLUCOPHAGE) 500 MG tablet Take 500 mg by mouth daily with breakfast.  04/20/15   Historical Provider, MD  pravastatin (PRAVACHOL) 20 MG tablet Take 20 mg by mouth daily.  05/29/14   Historical Provider, MD  triamcinolone (KENALOG) 0.025 % cream Apply 1 application topically daily as needed (skin irritation).  05/28/15   Historical Provider, MD  warfarin (COUMADIN) 5 MG tablet Take as directed by coumadin clinic Patient taking differently: Take 7.5-10 mg by mouth daily. Take 10 mg by mouth on Sunday and take 7.5 mg by mouth on all other days 08/22/13   Thompson Grayer, MD   BP 94/64 mmHg  Pulse 76  Temp(Src) 99.1 F (37.3 C) (Oral)  Resp 16  SpO2 94% Physical Exam    Constitutional: He is oriented to person, place, and time. He appears well-developed and well-nourished.  HENT:  Head: Normocephalic and atraumatic.    Mouth/Throat: Oropharynx is clear and moist.  Eyes: Conjunctivae and EOM are normal. Pupils are equal, round, and reactive to light.  No TTP of maxillary or frontal sinuses  No TTP or induration of temporal arteries bilaterally  Neck: Normal range of motion. Neck supple.  FROM to C-spine. Pt can touch chin to chest without discomfort. No TTP of midline cervical spine.   Cardiovascular: Normal rate, regular rhythm and intact distal pulses.   Pulmonary/Chest: Effort normal and breath sounds normal. No respiratory distress. He has no wheezes. He has no rales. He exhibits no tenderness.  Abdominal: Soft. Bowel sounds are normal. There is no tenderness.  Musculoskeletal: Normal range of motion. He exhibits no edema or tenderness.  Neurological: He is alert and oriented to person, place, and time. No cranial nerve deficit.  II-Visual fields grossly intact. III/IV/VI-Extraocular movements intact.  Pupils reactive bilaterally. V/VII-Smile symmetric, equal eyebrow raise,  facial sensation intact VIII- Hearing grossly intact IX/X-Normal gag XI-bilateral shoulder shrug XII-midline tongue extension Motor: 5/5 bilaterally with normal tone and bulk Cerebellar: Normal finger-to-nose  and normal heel-to-shin test.   Romberg negative Ambulates with a coordinated gait   Skin:  1.5 cm full-thickness laceration to right temporal area as diagrammed, bleeding is controlled. No underlying crepitance.  Nursing note and vitals reviewed.   ED Course  .Marland KitchenLaceration Repair Date/Time: 03/25/2016 9:06 PM Performed by: Monico Blitz Authorized by: Monico Blitz Consent: Verbal consent obtained. Risks and benefits: risks, benefits and alternatives were discussed Consent given by: patient Patient identity confirmed: verbally with patient Body area:  head/neck Location details: scalp Laceration length: 1.5 cm Vascular damage: no Patient sedated: no Preparation: Patient was prepped and draped in the usual sterile fashion. Irrigation solution: saline Irrigation method: syringe Amount of cleaning: standard Debridement: none Degree of undermining: none Skin closure: staples Number of sutures: 1 Patient tolerance: Patient tolerated the procedure well with no immediate complications   (including critical care time) Labs Review Labs Reviewed  PROTIME-INR - Abnormal; Notable for the following:    Prothrombin Time 26.5 (*)    INR 2.47 (*)    All other components within normal limits  COMPREHENSIVE METABOLIC PANEL - Abnormal; Notable for the following:    Glucose, Bld 209 (*)    BUN 21 (*)    Creatinine, Ser 1.62 (*)    ALT 16 (*)    GFR calc non Af Amer 40 (*)    GFR calc Af Amer 47 (*)    All other components within normal limits  CBC WITH DIFFERENTIAL/PLATELET    Imaging Review No results found.  I have personally reviewed and evaluated these images and lab results as part of my medical decision-making.   EKG Interpretation None      MDM   Final diagnoses:  Scalp laceration, initial encounter  Head trauma, initial encounter  Chronic anticoagulation   Filed Vitals:   03/25/16 1737 03/25/16 1843  BP: 94/64 103/63  Pulse: 76 85  Temp: 99.1 F (37.3 C) 98.2 F (36.8 C)  TempSrc: Oral Oral  Resp: 16 16  SpO2: 94% 96%    Medications  Tdap (BOOSTRIX) injection 0.5 mL (not administered)  lidocaine-EPINEPHrine-tetracaine (LET) solution (not administered)  lidocaine (PF) (XYLOCAINE) 1 % injection 5 mL (not administered)    [name] is 75 y.o. male presenting with Scalp laceration after a rock was thrown from the lawnmower and hit him in the head, patient is chronically anticoagulated for atrial fibrillation. His INR is therapeutic today at just under 2.5.   Tetanus is updated, wound is cleaned and closed  with one staple, CT head negative.  This is a shared visit with the attending physician who personally evaluated the patient and agrees with the care plan.   Evaluation does not show pathology that would require ongoing emergent intervention or inpatient treatment. Pt is hemodynamically stable and mentating appropriately. Discussed findings and plan with patient/guardian, who agrees with care plan. All questions answered. Return precautions discussed and outpatient follow up given.     Monico Blitz, PA-C 03/25/16 2226  Isla Pence, MD 03/25/16 2229

## 2016-04-11 ENCOUNTER — Encounter: Payer: Self-pay | Admitting: Cardiology

## 2016-04-11 LAB — CUP PACEART REMOTE DEVICE CHECK
Date Time Interrogation Session: 20170418131422
Implantable Lead Implant Date: 20100125
Implantable Lead Model: 4471
Implantable Lead Serial Number: 639547
Lead Channel Impedance Value: 486 Ohm
Lead Channel Impedance Value: 67 Ohm
Lead Channel Pacing Threshold Amplitude: 0.875 V
Lead Channel Pacing Threshold Pulse Width: 0.4 ms
MDC IDC LEAD IMPLANT DT: 20100125
MDC IDC LEAD LOCATION: 753859
MDC IDC LEAD LOCATION: 753860
MDC IDC LEAD SERIAL: 475977
MDC IDC MSMT BATTERY IMPEDANCE: 100 Ohm
MDC IDC MSMT BATTERY REMAINING LONGEVITY: 142 mo
MDC IDC MSMT BATTERY VOLTAGE: 2.79 V
MDC IDC SET LEADCHNL RV PACING AMPLITUDE: 2.5 V
MDC IDC SET LEADCHNL RV PACING PULSEWIDTH: 0.4 ms
MDC IDC SET LEADCHNL RV SENSING SENSITIVITY: 4 mV
MDC IDC STAT BRADY RV PERCENT PACED: 100 %

## 2016-06-03 ENCOUNTER — Telehealth: Payer: Self-pay | Admitting: Cardiology

## 2016-06-03 ENCOUNTER — Ambulatory Visit (INDEPENDENT_AMBULATORY_CARE_PROVIDER_SITE_OTHER): Payer: Medicare Other | Admitting: *Deleted

## 2016-06-03 DIAGNOSIS — Z950 Presence of cardiac pacemaker: Secondary | ICD-10-CM

## 2016-06-03 DIAGNOSIS — I442 Atrioventricular block, complete: Secondary | ICD-10-CM

## 2016-06-03 NOTE — Telephone Encounter (Signed)
Spoke with pt and reminded pt of remote transmission that is due today. Pt verbalized understanding.   

## 2016-06-03 NOTE — Progress Notes (Signed)
Remote pacemaker transmission.   

## 2016-06-05 ENCOUNTER — Encounter: Payer: Self-pay | Admitting: Cardiology

## 2016-06-17 LAB — CUP PACEART REMOTE DEVICE CHECK
Battery Impedance: 112 Ohm
Battery Remaining Longevity: 137 mo
Battery Voltage: 2.79 V
Brady Statistic RV Percent Paced: 100 %
Date Time Interrogation Session: 20170718161716
Implantable Lead Implant Date: 20100125
Implantable Lead Implant Date: 20100125
Implantable Lead Location: 753859
Implantable Lead Location: 753860
Implantable Lead Model: 4470
Implantable Lead Model: 4471
Implantable Lead Serial Number: 475977
Implantable Lead Serial Number: 639547
Lead Channel Impedance Value: 476 Ohm
Lead Channel Impedance Value: 67 Ohm
Lead Channel Pacing Threshold Amplitude: 0.875 V
Lead Channel Pacing Threshold Pulse Width: 0.4 ms
Lead Channel Setting Pacing Amplitude: 2.5 V
Lead Channel Setting Pacing Pulse Width: 0.4 ms
Lead Channel Setting Sensing Sensitivity: 4 mV

## 2016-08-05 ENCOUNTER — Other Ambulatory Visit: Payer: Self-pay | Admitting: Internal Medicine

## 2016-09-02 ENCOUNTER — Ambulatory Visit (INDEPENDENT_AMBULATORY_CARE_PROVIDER_SITE_OTHER): Payer: Medicare Other | Admitting: *Deleted

## 2016-09-02 DIAGNOSIS — I442 Atrioventricular block, complete: Secondary | ICD-10-CM

## 2016-09-02 NOTE — Progress Notes (Signed)
Remote pacemaker transmission.   

## 2016-09-03 ENCOUNTER — Encounter: Payer: Self-pay | Admitting: Cardiology

## 2016-09-19 LAB — CUP PACEART REMOTE DEVICE CHECK
Battery Impedance: 112 Ohm
Battery Voltage: 2.79 V
Brady Statistic RV Percent Paced: 100 %
Date Time Interrogation Session: 20171017140441
Implantable Lead Implant Date: 20100125
Implantable Lead Location: 753860
Implantable Lead Model: 4470
Implantable Lead Model: 4471
Implantable Lead Serial Number: 639547
Lead Channel Setting Pacing Amplitude: 2.5 V
Lead Channel Setting Pacing Pulse Width: 0.4 ms
Lead Channel Setting Sensing Sensitivity: 4 mV
MDC IDC LEAD IMPLANT DT: 20100125
MDC IDC LEAD LOCATION: 753859
MDC IDC LEAD SERIAL: 475977
MDC IDC MSMT BATTERY REMAINING LONGEVITY: 140 mo
MDC IDC MSMT LEADCHNL RA IMPEDANCE VALUE: 67 Ohm
MDC IDC MSMT LEADCHNL RV IMPEDANCE VALUE: 532 Ohm
MDC IDC MSMT LEADCHNL RV PACING THRESHOLD AMPLITUDE: 0.875 V
MDC IDC MSMT LEADCHNL RV PACING THRESHOLD PULSEWIDTH: 0.4 ms
MDC IDC PG IMPLANT DT: 20160930

## 2016-09-29 ENCOUNTER — Other Ambulatory Visit: Payer: Self-pay | Admitting: Nurse Practitioner

## 2016-11-05 ENCOUNTER — Encounter: Payer: Self-pay | Admitting: *Deleted

## 2016-11-26 ENCOUNTER — Encounter: Payer: Self-pay | Admitting: Internal Medicine

## 2016-11-26 ENCOUNTER — Ambulatory Visit (INDEPENDENT_AMBULATORY_CARE_PROVIDER_SITE_OTHER): Payer: Medicare Other | Admitting: Internal Medicine

## 2016-11-26 VITALS — BP 124/80 | HR 84 | Ht 72.0 in | Wt 333.0 lb

## 2016-11-26 DIAGNOSIS — I442 Atrioventricular block, complete: Secondary | ICD-10-CM | POA: Diagnosis not present

## 2016-11-26 DIAGNOSIS — Z950 Presence of cardiac pacemaker: Secondary | ICD-10-CM | POA: Diagnosis not present

## 2016-11-26 DIAGNOSIS — I482 Chronic atrial fibrillation: Secondary | ICD-10-CM | POA: Diagnosis not present

## 2016-11-26 DIAGNOSIS — I4821 Permanent atrial fibrillation: Secondary | ICD-10-CM

## 2016-11-26 NOTE — Progress Notes (Signed)
PCP:  [name], MD  The patient presents today for routine electrophysiology followup.  Since his last visit, the patient reports doing reasonably well.  He has had chronic difficulty with obesity and poor exercise tolerance.  He has some edema.  Today, he denies symptoms of palpitations, chest pain, dizziness, presyncope, syncope, or neurologic sequela.  The patient feels that he is tolerating medications without difficulties and is otherwise without complaint today.   Past Medical History:  Diagnosis Date  . Arthritis   . Chronic anticoagulation   . Depression   . Gout   . History of echocardiogram    Echo 9/16:  EF 55-60%, no RWMA, mild MR, mild LAE, trivial PI  . HTN (hypertension)   . LV dysfunction    EF down to 30% per echo Feb 2013 -  started on ARB and low dose beta blocker  . Morbid obesity ([place])   . Nephrolithiasis   . Osteoarthritis   . 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 Permanent atrial fibrillation ([place])    failed cardioversion in April of 2010  . Prostate CA ([place])   . Urosepsis    Past Surgical History:  Procedure Laterality Date  . CARDIOVERSION  02/2009   failed  . EP IMPLANTABLE DEVICE N/A 08/17/2015   Procedure: PPM Generator Changeout;  Surgeon: Thompson Grayer, MD;  Location: [facility];  Service: Cardiovascular;  Laterality: N/A;  . PACEMAKER PLACEMENT  2010   Medtronic  . RADIOACTIVE SEED IMPLANT    . SHOULDER SURGERY    . TOTAL KNEE ARTHROPLASTY      Current Outpatient Prescriptions  Medication Sig Dispense Refill  . allopurinol (ZYLOPRIM) 300 MG tablet TAKE ONE TABLET BY MOUTH ONCE DAILY (Patient taking differently: TAKE 300 MG BY MOUTH ONCE DAILY) 30 tablet 0  . carvedilol (COREG) 3.125 MG tablet Take 1 tablet (3.125 mg total) by mouth 2 (two) times daily. 180 tablet 3  . celecoxib (CELEBREX) 200 MG capsule Take 200 mg by mouth every other day.      . clotrimazole-betamethasone (LOTRISONE) cream Apply 1 application topically daily as needed (skin  irritation).     . hydrochlorothiazide (HYDRODIURIL) 25 MG tablet TAKE ONE TABLET BY MOUTH ONCE DAILY 30 tablet 11  . losartan (COZAAR) 50 MG tablet TAKE ONE TABLET BY MOUTH ONCE DAILY 90 tablet 2  . metFORMIN (GLUCOPHAGE) 500 MG tablet Take 500 mg by mouth daily with breakfast.     . pravastatin (PRAVACHOL) 20 MG tablet Take 20 mg by mouth daily.     Marland Kitchen warfarin (COUMADIN) 5 MG tablet Take as directed by coumadin clinic (Patient taking differently: Take 7.5-10 mg by mouth daily. Take 10 mg by mouth on Sunday and take 7.5 mg by mouth on all other days) 150 tablet 1  . albuterol (PROVENTIL HFA;VENTOLIN HFA) 108 (90 Base) MCG/ACT inhaler Inhale 2 puffs into the lungs every 6 (six) hours as needed for wheezing or shortness of breath.     No current facility-administered medications for this visit.     Allergies  Allergen Reactions  . Oxycodone Other (See Comments)    Makes feel funny  . Sertraline Hcl Other (See Comments)    Makes feel funny    Social History   Social History  . Marital status: Widowed    Spouse name: N/A  . Number of children: 1  . Years of education: N/A   Occupational History  . Retired Administrator Retired   Social History Main Topics  .  Smoking status: Never Smoker  . Smokeless tobacco: Never Used  . Alcohol use No  . Drug use: No  . Sexual activity: No   Other Topics Concern  . Not on file   Social History Narrative  . No narrative on file    Family History  Problem Relation Age of Onset  . Heart attack Father 30  . Healthy Mother   . Healthy Brother     Physical Exam: Vitals:   11/26/16 1551  BP: 124/80  Pulse: 84  Weight: (!) 333 lb (151 kg)  Height: 6' (1.829 m)    GEN- The patient is overweight and frail appearing, alert and oriented x 3 today.   Head- normocephalic, atraumatic Eyes-  Sclera clear, conjunctiva pink Ears- hearing intact Oropharynx- clear Neck- supple,   Lungs- Clear to ausculation bilaterally, normal work of  breathing Chest- pacemaker pocket is well healed Heart- Regular rate and rhythm (V paced) GI- soft, NT, ND, + BS Extremities- no clubbing, cyanosis,+1-2 dependant edema  Pacemaker interrogation- reviewed in detail today,  See PACEART report  Assessment and Plan:  1. Complete heart block Normal pacemaker function See Pace Art report No changes today  2. Chronic systolic dysfunction Stable No change required today  3. Permanent afib Continue long term anticoagulation  4. Obesity Weight loss is strongly advised  carelink Follow-up with Cecille Rubin in 6 months I will see in a year  Thompson Grayer MD, [facility] 11/26/2016 4:14 PM

## 2016-11-26 NOTE — Patient Instructions (Signed)
Medication Instructions:  Your physician recommends that you continue on your current medications as directed. Please refer to the Current Medication list given to you today.   Labwork: None ordered   Testing/Procedures: None ordered   Follow-Up: Your physician wants you to follow-up in: 6 months with Truitt Merle, NP and 12 months with Dr Vallery Ridge will receive a reminder letter in the mail two months in advance. If you don't receive a letter, please call our office to schedule the follow-up appointment.  Remote monitoring is used to monitor your Pacemaker  from home. This monitoring reduces the number of office visits required to check your device to one time per year. It allows [place] to keep an eye on the functioning of your device to ensure it is working properly. You are scheduled for a device check from home on 02/25/17. You may send your transmission at any time that day. If you have a wireless device, the transmission will be sent automatically. After your physician reviews your transmission, you will receive a postcard with your next transmission date.     Any Other Special Instructions Will Be Listed Below (If Applicable).     If you need a refill on your cardiac medications before your next appointment, please call your pharmacy.

## 2016-12-08 LAB — CUP PACEART INCLINIC DEVICE CHECK
Battery Impedance: 112 Ohm
Battery Voltage: 2.79 V
Brady Statistic RV Percent Paced: 100 %
Implantable Lead Implant Date: 20100125
Implantable Lead Implant Date: 20100125
Implantable Lead Location: 753859
Implantable Lead Location: 753860
Implantable Lead Model: 4471
Implantable Lead Serial Number: 475977
Implantable Lead Serial Number: 639547
Lead Channel Impedance Value: 67 Ohm
Lead Channel Pacing Threshold Amplitude: 0.75 V
Lead Channel Setting Pacing Amplitude: 2.5 V
Lead Channel Setting Pacing Pulse Width: 0.4 ms
Lead Channel Setting Sensing Sensitivity: 4 mV
MDC IDC MSMT BATTERY REMAINING LONGEVITY: 139 mo
MDC IDC MSMT LEADCHNL RV IMPEDANCE VALUE: 495 Ohm
MDC IDC MSMT LEADCHNL RV PACING THRESHOLD PULSEWIDTH: 0.4 ms
MDC IDC PG IMPLANT DT: 20160930
MDC IDC SESS DTM: 20180110221432

## 2017-01-07 ENCOUNTER — Ambulatory Visit: Payer: Medicare Other | Admitting: Nurse Practitioner

## 2017-01-29 ENCOUNTER — Other Ambulatory Visit: Payer: Self-pay | Admitting: Internal Medicine

## 2017-02-25 ENCOUNTER — Encounter: Payer: Medicare Other | Admitting: *Deleted

## 2017-02-25 ENCOUNTER — Telehealth: Payer: Self-pay | Admitting: Cardiology

## 2017-02-25 NOTE — Telephone Encounter (Signed)
Spoke with pt and reminded pt of remote transmission that is due today. Pt verbalized understanding.   

## 2017-05-26 ENCOUNTER — Ambulatory Visit: Payer: Medicare Other | Admitting: Nurse Practitioner

## 2017-06-17 ENCOUNTER — Ambulatory Visit (INDEPENDENT_AMBULATORY_CARE_PROVIDER_SITE_OTHER): Payer: Medicare Other | Admitting: Nurse Practitioner

## 2017-06-17 ENCOUNTER — Encounter (INDEPENDENT_AMBULATORY_CARE_PROVIDER_SITE_OTHER): Payer: Self-pay

## 2017-06-17 ENCOUNTER — Encounter: Payer: Self-pay | Admitting: Nurse Practitioner

## 2017-06-17 VITALS — BP 108/70 | HR 75 | Ht 72.0 in | Wt 314.6 lb

## 2017-06-17 DIAGNOSIS — Z950 Presence of cardiac pacemaker: Secondary | ICD-10-CM

## 2017-06-17 DIAGNOSIS — I482 Chronic atrial fibrillation: Secondary | ICD-10-CM

## 2017-06-17 DIAGNOSIS — I4821 Permanent atrial fibrillation: Secondary | ICD-10-CM

## 2017-06-17 NOTE — Patient Instructions (Addendum)
We will be checking the following labs today - NONE   Medication Instructions:    Continue with your current medicines.     Testing/Procedures To Be Arranged:  N/A  Follow-Up:   See [name] in 6 months; me in one year.     Other Special Instructions:   Keep up the good work!!    If you need a refill on your cardiac medications before your next appointment, please call your pharmacy.   Call the [place] office at [phone] if you have any questions, problems or concerns.

## 2017-06-17 NOTE — Progress Notes (Signed)
CARDIOLOGY OFFICE NOTE  Date:  06/17/2017    [name] Date of Birth: 04-25-1941 Medical Record #[id number]  PCP:  Shon Baton, MD  Cardiologist:  [facility] Snare [facility] Allred  Chief Complaint  Patient presents with  . Atrial Fibrillation    Follow up visit - seen for [name]    History of Present Illness: ALONDRA SAHNI is a 76 y.o. male who presents today for a follow up visit. Seen for [name].   He has HTN, morbid obesity (with unsustained weight loss), chronic atrial fib on coumadin and has a pacemaker in place. EF 35% per echo from March of 2013. He has been very reluctant to take more medicines and has not wanted a lot done for him - this has been chronic over many years. Dr. Virgina Jock checks his coumadin.   I last saw him back in August of 2016 - device was at [facility] - he was more short of breath. Echo updated and EF noted to have actually improved. He did well with his generator replacement. Last seen by me back in February of 2017. Last seen by [name] in January of 2018 and was felt to be doing ok.    KPN tool shows labs from 02/2017 with TC of 136, HLD 33, LDL 61, TG 208, A1C 7.5, HGB 14.5, creatinine 1.2, TSH 2.1. Last INR from 06/09/2017 was 2.1.  Comes back today. Here alone today. His significant other is here but in the lobby - she does not feel well. He fell about 2 weeks ago - tri[place] - hurt his hip. Did not seek attention and does not wish to go and get an Xray.  He has lost weight - drinking less soda. His gait is unsteady. They have a camper down near the beach - he is actually getting in the water and walking. No chest pain. He does fatigue easily. Short of breath with activity but nothing anything worse. Shakes "so bad". Not felt to have Parkinson's. Meds going ok. Overall he feels like he is doing ok.   Past Medical History:  Diagnosis Date  . Arthritis   . Chronic anticoagulation   . Depression   . Gout   . History of  echocardiogram    Echo 9/16:  EF 55-60%, no RWMA, mild MR, mild LAE, trivial PI  . HTN (hypertension)   . LV dysfunction    EF down to 30% per echo Feb 2013 -  started on ARB and low dose beta blocker  . Morbid obesity ([place])   . Nephrolithiasis   . Osteoarthritis   . 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 Permanent atrial fibrillation ([place])    failed cardioversion in April of 2010  . Prostate CA ([place])   . Urosepsis     Past Surgical History:  Procedure Laterality Date  . CARDIOVERSION  02/2009   failed  . EP IMPLANTABLE DEVICE N/A 08/17/2015   Procedure: PPM Generator Changeout;  Surgeon: Thompson Grayer, MD;  Location: [facility];  Service: Cardiovascular;  Laterality: N/A;  . PACEMAKER PLACEMENT  2010   Medtronic  . RADIOACTIVE SEED IMPLANT    . SHOULDER SURGERY    . TOTAL KNEE ARTHROPLASTY       Medications: Current Meds  Medication Sig  . albuterol (PROVENTIL HFA;VENTOLIN HFA) 108 (90 Base) MCG/ACT inhaler Inhale 2 puffs into the lungs every 6 (six) hours as needed for wheezing or shortness of  breath.  . allopurinol (ZYLOPRIM) 300 MG tablet TAKE ONE TABLET BY MOUTH ONCE DAILY (Patient taking differently: TAKE 300 MG BY MOUTH ONCE DAILY)  . carvedilol (COREG) 3.125 MG tablet Take 1 tablet (3.125 mg total) by mouth 2 (two) times daily.  . celecoxib (CELEBREX) 200 MG capsule Take 200 mg by mouth every other day.    . clotrimazole-betamethasone (LOTRISONE) cream Apply 1 application topically daily as needed (skin irritation).   . hydrochlorothiazide (HYDRODIURIL) 25 MG tablet TAKE ONE TABLET BY MOUTH ONCE DAILY  . losartan (COZAAR) 50 MG tablet TAKE ONE TABLET BY MOUTH ONCE DAILY  . metFORMIN (GLUCOPHAGE) 500 MG tablet Take 500 mg by mouth daily with breakfast.   . pravastatin (PRAVACHOL) 20 MG tablet Take 20 mg by mouth daily.   Marland Kitchen warfarin (COUMADIN) 5 MG tablet Take as directed by coumadin clinic (Patient taking differently: Take 7.5-10 mg by mouth daily. Take 10 mg by mouth on  Sunday and take 7.5 mg by mouth on all other days)     Allergies: Allergies  Allergen Reactions  . Oxycodone Other (See Comments)    Makes feel funny  . Sertraline Hcl Other (See Comments)    Makes feel funny    Social History: The patient  reports that he has never smoked. He has never used smokeless tobacco. He reports that he does not drink alcohol or use drugs.   Family History: The patient's family history includes Healthy in his brother and mother; Heart attack (age of onset: 37) in his father.   Review of Systems: Please see the history of present illness.   Otherwise, the review of systems is positive for none.   All other systems are reviewed and negative.   Physical Exam: VS:  BP 108/70 (BP Location: Left Arm, Patient Position: Sitting, Cuff Size: Large)   Pulse 75   Ht 6' (1.829 m)   Wt (!) 314 lb 9.6 oz (142.7 kg)   SpO2 96% Comment: at rest  BMI 42.67 kg/m  .  BMI Body mass index is 42.67 kg/m.  Wt Readings from Last 3 Encounters:  06/17/17 (!) 314 lb 9.6 oz (142.7 kg)  11/26/16 (!) 333 lb (151 kg)  01/08/16 (!) 336 lb (152.4 kg)    General: Pleasant. Morbidly obese but he has lost weight. Down 19#. Alert and in no acute distress. Quite suntanned today.  HEENT: Normal but with missing teeth.   Neck: Supple, no JVD, carotid bruits, or masses noted.  Cardiac: Irregular irregular rhythm. Heart tones are distant.  + edema. Legs quite full but actually look better than when I've seen him in the past.  Respiratory:  Lungs with decreased breath sounds but with normal work of breathing.  GI: Soft and nontender.  MS: No deformity or atrophy. Gait and ROM intact.  Skin: Warm and dry. Color is normal.  Neuro:  Strength and sensation are intact and no gross focal deficits noted.  Psych: Alert, appropriate and with normal affect.   LABORATORY DATA:  EKG:  EKG is not ordered today.  Lab Results  Component Value Date   WBC 6.8 03/25/2016   HGB 14.3 03/25/2016    HCT 42.0 03/25/2016   PLT 219 03/25/2016   GLUCOSE 209 (H) 03/25/2016   CHOL  09/15/2008    126        ATP III CLASSIFICATION:  <200     mg/dL   Desirable  200-239  mg/dL   Borderline High  >=240    mg/dL  High   TRIG 181 (H) 09/15/2008   HDL 11 (L) 09/15/2008   LDLCALC  09/15/2008    79        Total Cholesterol/HDL:CHD Risk Coronary Heart Disease Risk Table                     Men   Women  1/2 Average Risk   3.4   3.3   ALT 16 (L) 03/25/2016   AST 21 03/25/2016   NA 139 03/25/2016   K 3.7 03/25/2016   CL 103 03/25/2016   CREATININE 1.62 (H) 03/25/2016   BUN 21 (H) 03/25/2016   CO2 24 03/25/2016   TSH 1.487 Test methodology is 3rd generation TSH 09/15/2008   INR 2.47 (H) 03/25/2016   HGBA1C (H) 09/15/2008    6.3 (NOTE)   The ADA recommends the following therapeutic goal for glycemic   control related to Hgb A1C measurement:   Goal of Therapy:   < 7.0% Hgb A1C   Reference: American Diabetes Association: Clinical Practice   Recommendations 2008, Diabetes Care,  2008, 31:(Suppl 1).     BNP (last 3 results) No results for input(s): BNP in the last 8760 hours.  ProBNP (last 3 results) No results for input(s): PROBNP in the last 8760 hours.   Other Studies Reviewed Today:  Echo Study Conclusions 2016  - Left ventricle: The cavity size was normal. Wall thickness was   normal. Systolic function was normal. The estimated ejection   fraction was in the range of 50% to 55%. - Left atrium: The atrium was mildly dilated. - Atrial septum: No defect or patent foramen ovale was identified.  Assessment/Plan: 1. 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.   2. Chronic systolic dysfunction - last echo from 2016 actually showed improvement of his EF - his symptoms seem stable. BP soft. Not interested in taking more medicines.   3.  Permanent afib - managed with rate control and long term anticoagulation.   4. Obesity - down 19 pounds - he has been able  to do this before - just could not sustain - encouragement given  5. Hip pain from fall - does not wish to get Xray.   Current medicines are reviewed with the patient today.  The patient does not have concerns regarding medicines other than what has been noted above.  The following changes have been made:  See above.  Labs/ tests ordered today include:   No orders of the defined types were placed in this encounter.    Disposition:   FU with [name] in January and I will see in one year.   Patient is agreeable to this plan and will call if any problems develop in the interim.   SignedTruitt Merle, NP  06/17/2017 1:52 PM  [facility] [address] [place], [place]  [address] Phone: [phone] Fax: [phone]

## 2017-07-02 ENCOUNTER — Other Ambulatory Visit: Payer: Self-pay | Admitting: Nurse Practitioner

## 2017-08-10 ENCOUNTER — Other Ambulatory Visit: Payer: Self-pay | Admitting: Internal Medicine

## 2017-09-10 ENCOUNTER — Emergency Department (HOSPITAL_COMMUNITY)
Admission: EM | Admit: 2017-09-10 | Discharge: 2017-09-10 | Disposition: A | Discharge status: Home / Self Care | Payer: Medicare Other | Attending: Emergency Medicine | Admitting: Emergency Medicine

## 2017-09-10 ENCOUNTER — Encounter (HOSPITAL_COMMUNITY): Payer: Self-pay

## 2017-09-10 DIAGNOSIS — R51 Headache: Secondary | ICD-10-CM | POA: Diagnosis not present

## 2017-09-10 DIAGNOSIS — B028 Zoster with other complications: Secondary | ICD-10-CM | POA: Insufficient documentation

## 2017-09-10 DIAGNOSIS — R21 Rash and other nonspecific skin eruption: Secondary | ICD-10-CM | POA: Diagnosis present

## 2017-09-10 DIAGNOSIS — Z79899 Other long term (current) drug therapy: Secondary | ICD-10-CM | POA: Insufficient documentation

## 2017-09-10 DIAGNOSIS — Z8546 Personal history of malignant neoplasm of prostate: Secondary | ICD-10-CM | POA: Diagnosis not present

## 2017-09-10 DIAGNOSIS — I110 Hypertensive heart disease with heart failure: Secondary | ICD-10-CM | POA: Diagnosis not present

## 2017-09-10 DIAGNOSIS — I5022 Chronic systolic (congestive) heart failure: Secondary | ICD-10-CM | POA: Insufficient documentation

## 2017-09-10 DIAGNOSIS — Z7901 Long term (current) use of anticoagulants: Secondary | ICD-10-CM | POA: Insufficient documentation

## 2017-09-10 DIAGNOSIS — Z7984 Long term (current) use of oral hypoglycemic drugs: Secondary | ICD-10-CM | POA: Diagnosis not present

## 2017-09-10 DIAGNOSIS — R519 Headache, unspecified: Secondary | ICD-10-CM

## 2017-09-10 DIAGNOSIS — B029 Zoster without complications: Secondary | ICD-10-CM

## 2017-09-10 LAB — PROTIME-INR
INR: 2.29
Prothrombin Time: 25 seconds — ABNORMAL HIGH (ref 11.4–15.2)

## 2017-09-10 LAB — CBC WITH DIFFERENTIAL/PLATELET
BASOS ABS: 0.1 10*3/uL (ref 0.0–0.1)
Basophils Relative: 1 %
EOS PCT: 5 %
Eosinophils Absolute: 0.3 10*3/uL (ref 0.0–0.7)
HEMATOCRIT: 40.2 % (ref 39.0–52.0)
HEMOGLOBIN: 13.2 g/dL (ref 13.0–17.0)
LYMPHS PCT: 30 %
Lymphs Abs: 1.7 10*3/uL (ref 0.7–4.0)
MCH: 30.1 pg (ref 26.0–34.0)
MCHC: 32.8 g/dL (ref 30.0–36.0)
MCV: 91.6 fL (ref 78.0–100.0)
MONOS PCT: 11 %
Monocytes Absolute: 0.6 10*3/uL (ref 0.1–1.0)
NEUTROS PCT: 53 %
Neutro Abs: 2.8 10*3/uL (ref 1.7–7.7)
Platelets: 160 10*3/uL (ref 150–400)
RBC: 4.39 MIL/uL (ref 4.22–5.81)
RDW: 14.3 % (ref 11.5–15.5)
WBC: 5.5 10*3/uL (ref 4.0–10.5)

## 2017-09-10 LAB — COMPREHENSIVE METABOLIC PANEL
ALT: 13 U/L — ABNORMAL LOW (ref 17–63)
AST: 19 U/L (ref 15–41)
Albumin: 3.8 g/dL (ref 3.5–5.0)
Alkaline Phosphatase: 61 U/L (ref 38–126)
Anion gap: 9 (ref 5–15)
BUN: 29 mg/dL — ABNORMAL HIGH (ref 6–20)
CO2: 28 mmol/L (ref 22–32)
Calcium: 9.3 mg/dL (ref 8.9–10.3)
Chloride: 100 mmol/L — ABNORMAL LOW (ref 101–111)
Creatinine, Ser: 1.46 mg/dL — ABNORMAL HIGH (ref 0.61–1.24)
GFR calc Af Amer: 52 mL/min — ABNORMAL LOW (ref >60)
GFR calc non Af Amer: 45 mL/min — ABNORMAL LOW (ref >60)
Glucose, Bld: 144 mg/dL — ABNORMAL HIGH (ref 65–99)
Potassium: 3.7 mmol/L (ref 3.5–5.1)
Sodium: 137 mmol/L (ref 135–145)
Total Bilirubin: 0.9 mg/dL (ref 0.3–1.2)
Total Protein: 7.2 g/dL (ref 6.5–8.1)

## 2017-09-10 MED ORDER — VALACYCLOVIR HCL 1 G PO TABS: 1000.0000 mg | ORAL_TABLET | Freq: Three times a day (TID) | ORAL | 0 refills | Status: DC

## 2017-09-10 MED ORDER — VALACYCLOVIR HCL 500 MG PO TABS
1000.0000 mg | ORAL_TABLET | Freq: Once | ORAL | Status: AC
  Administered 2017-09-10: 1000 mg via ORAL
  Filled 2017-09-10: qty 2

## 2017-09-10 NOTE — ED Triage Notes (Signed)
Patient complains of right sided head and ear pain for greater than a week and saw MD for same and they said inner ear. Now has redness from right breast to right scapula, possible shingles. No drainage, minimal pain. Alert and oriented

## 2017-09-10 NOTE — ED Provider Notes (Signed)
[facility] [facility] EMERGENCY DEPARTMENT Provider Note   CSN: [id number] Arrival date & time: 09/10/17  1225     History   Chief Complaint No chief complaint on file.   HPI [name] is a 76 y.o. male.  Patient with history of chronic Coumadin use secondary to atrial fibrillation presents with complaint of right-sided rash extending from a chest around lateral chest to the scapula. Patient has a burning pain. Rash began 5 days ago. No significant drainage or bleeding. Patient has not had a rash like this in the past. No fevers or confusion. Patient has had ongoing intermittent headaches, currently completely resolved. He was given gabapentin by primary care. Patient denies signs of stroke including: facial droop, slurred speech, aphasia, weakness/numbness in extremities, imbalance/trouble walking worse than baseline. Patient is unsteady at baseline and this is unchanged. Patient reports occasional falls but has not hit his head recently. No vision changes, vomiting. Patient is here today mainly due to his rash. No fevers, neck pain.       Past Medical History:  Diagnosis Date  . Arthritis   . Chronic anticoagulation   . Depression   . Gout   . History of echocardiogram    Echo 9/16:  EF 55-60%, no RWMA, mild MR, mild LAE, trivial PI  . HTN (hypertension)   . LV dysfunction    EF down to 30% per echo Feb 2013 -  started on ARB and low dose beta blocker  . Morbid obesity ([place])   . Nephrolithiasis   . Osteoarthritis   . 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 Permanent atrial fibrillation ([place])    failed cardioversion in April of 2010  . Prostate CA ([place])   . Urosepsis     Patient Active Problem List   Diagnosis Date Noted  . Chronic systolic heart failure ([place]) 04/20/2013  . Biceps rupture, distal 04/01/2012  . LV dysfunction 02/27/2012  . Teeth problem 02/27/2012  . Pacemaker-Medtronic 02/10/2011  . MORBID OBESITY 12/13/2010  . ESSENTIAL HYPERTENSION, BENIGN  12/13/2010  . Permanent atrial fibrillation ([place]) 12/13/2010  . Complete heart block ([place]) 12/13/2010    Past Surgical History:  Procedure Laterality Date  . CARDIOVERSION  02/2009   failed  . EP IMPLANTABLE DEVICE N/A 08/17/2015   Procedure: PPM Generator Changeout;  Surgeon: Thompson Grayer, MD;  Location: [facility];  Service: Cardiovascular;  Laterality: N/A;  . PACEMAKER PLACEMENT  2010   Medtronic  . RADIOACTIVE SEED IMPLANT    . SHOULDER SURGERY    . TOTAL KNEE ARTHROPLASTY         Home Medications    Prior to Admission medications   Medication Sig Start Date End Date Taking? Authorizing Provider  albuterol (PROVENTIL HFA;VENTOLIN HFA) 108 (90 Base) MCG/ACT inhaler Inhale 2 puffs into the lungs every 6 (six) hours as needed for wheezing or shortness of breath.    [provider]  allopurinol (ZYLOPRIM) 300 MG tablet TAKE ONE TABLET BY MOUTH ONCE DAILY Patient taking differently: TAKE 300 MG BY MOUTH ONCE DAILY 07/02/15   Nahser, Wonda Cheng, MD  carvedilol (COREG) 3.125 MG tablet TAKE ONE TABLET BY MOUTH TWICE DAILY 08/10/17   Allred, Jeneen Rinks, MD  celecoxib (CELEBREX) 200 MG capsule Take 200 mg by mouth every other day.      [provider]  clotrimazole-betamethasone (LOTRISONE) cream Apply 1 application topically daily as needed (skin irritation).  02/03/12   [provider]  hydrochlorothiazide (HYDRODIURIL) 25 MG tablet TAKE ONE  TABLET BY MOUTH ONCE DAILY 01/29/17   Allred, Jeneen Rinks, MD  losartan (COZAAR) 50 MG tablet TAKE ONE TABLET BY MOUTH ONCE DAILY 07/02/17   Burtis Junes, NP  metFORMIN (GLUCOPHAGE) 500 MG tablet Take 500 mg by mouth daily with breakfast.  04/20/15   [provider]  pravastatin (PRAVACHOL) 20 MG tablet Take 20 mg by mouth daily.  05/29/14   [provider]  warfarin (COUMADIN) 5 MG tablet Take as directed by coumadin clinic Patient taking differently: Take 7.5-10 mg by mouth daily. Take 10 mg by mouth on Sunday  and take 7.5 mg by mouth on all other days 08/22/13   Thompson Grayer, MD    Family History Family History  Problem Relation Age of Onset  . Heart attack Father 78  . Healthy Mother   . Healthy Brother     Social History Social History  Substance Use Topics  . Smoking status: Never Smoker  . Smokeless tobacco: Never Used  . Alcohol use No     Allergies   Oxycodone and Sertraline hcl   Review of Systems Review of Systems  Constitutional: Negative for fever.  HENT: Negative for congestion, dental problem, rhinorrhea and sinus pressure.   Eyes: Negative for photophobia, discharge, redness and visual disturbance.  Respiratory: Negative for shortness of breath.   Cardiovascular: Negative for chest pain.  Gastrointestinal: Negative for nausea and vomiting.  Musculoskeletal: Positive for gait problem (baseline, stable) and neck stiffness. Negative for neck pain.  Skin: Positive for rash.  Neurological: Positive for headaches. Negative for syncope, speech difficulty, weakness, light-headedness and numbness.  Psychiatric/Behavioral: Negative for confusion.     Physical Exam Updated Vital Signs BP 117/72 (BP Location: Right Arm)   Pulse 69   Temp (!) 97.4 F (36.3 C) (Oral)   Resp 18   SpO2 99%   Physical Exam  Constitutional: He is oriented to person, place, and time. He appears well-developed and well-nourished.  HENT:  Head: Normocephalic and atraumatic.  Right Ear: Tympanic membrane, external ear and ear canal normal.  Left Ear: Tympanic membrane, external ear and ear canal normal.  Nose: Nose normal.  Mouth/Throat: Uvula is midline, oropharynx is clear and moist and mucous membranes are normal.  Eyes: Pupils are equal, round, and reactive to light. Conjunctivae, EOM and lids are normal.  Neck: Normal range of motion. Neck supple.  No meningismus noted.  Cardiovascular: Normal rate and regular rhythm.   No murmur heard. Pulmonary/Chest: Effort normal and breath  sounds normal.  Abdominal: Soft. There is no tenderness.  Musculoskeletal: Normal range of motion.       Cervical back: He exhibits normal range of motion, no tenderness and no bony tenderness.  Vesicular/hemorrragic rash consistent with shingles in dermatomal distribution from mid-chest to scapula on right.   Neurological: He is alert and oriented to person, place, and time. He has normal strength and normal reflexes. No cranial nerve deficit or sensory deficit. He exhibits normal muscle tone. He displays a negative Romberg sign. Coordination normal. GCS eye subscore is 4. GCS verbal subscore is 5. GCS motor subscore is 6.  Tremulous with standing, states unsteadiness at baseline.   Skin: Skin is warm and dry.  Psychiatric: He has a normal mood and affect.  Nursing note and vitals reviewed.    ED Treatments / Results  Labs (all labs ordered are listed, but only abnormal results are displayed) Labs Reviewed  COMPREHENSIVE METABOLIC PANEL - Abnormal; Notable for the following:  Result Value   Chloride 100 (*)    Glucose, Bld 144 (*)    BUN 29 (*)    Creatinine, Ser 1.46 (*)    ALT 13 (*)    GFR calc non Af Amer 45 (*)    GFR calc Af Amer 52 (*)    All other components within normal limits  CBC WITH DIFFERENTIAL/PLATELET  PROTIME-INR    EKG  EKG Interpretation None       Radiology No results found.  Procedures Procedures (including critical care time)  Medications Ordered in ED Medications  valACYclovir (VALTREX) tablet 1,000 mg (not administered)     Initial Impression / Assessment and Plan / ED Course  I have reviewed the triage vital signs and the nursing notes.  Pertinent labs & imaging results that were available during my care of the patient were reviewed by me and considered in my medical decision making (see chart for details).     Patient seen and examined. Seen with Dr. Rex Kras and rash evaluated. Will check INR and treat with Valtrex. No dosing  adjustment based on calculated creatinine clearance. Offered head CT -- patient declines stating HA is gone now. He would prefer to follow-up with his doctor.   Vital signs reviewed and are as follows: BP 123/72   Pulse 71   Temp (!) 97.4 F (36.3 C) (Oral)   Resp 17   SpO2 98%   7:02 PM patient anxious to go. Coumadin level is therapeutic. Given first dose of Valtrex here.  Encouraged PCP follow-up for recheck and also for evaluation of headaches.  BP 120/74   Pulse 70   Temp (!) 97.4 F (36.3 C) (Oral)   Resp 16   SpO2 100%      Final Clinical Impressions(s) / ED Diagnoses   Final diagnoses:  Herpes zoster without complication  New onset of headaches   Rash: C/w herpes zoster. Will treat accordingly.   HA: Currently resolved. Does not want imaging.   Patient has a normal complete neurological exam, normal vital signs, normal level of consciousness, no signs of meningismus, is well-appearing/non-toxic appearing, no signs of trauma, no pain over the temporal arteries.   Will f/u with PCP.    New Prescriptions New Prescriptions   VALACYCLOVIR (VALTREX) 1000 MG TABLET    Take 1 tablet (1,000 mg total) by mouth 3 (three) times daily.     Carlisle Cater, PA-C 09/10/17 1905    Little, Wenda Overland, MD 09/20/17 [phone]

## 2017-09-10 NOTE — ED Notes (Signed)
Pt verbalized understanding discharge instructions and denies any further needs or questions at this time. VS stable, ambulatory and steady gait.   

## 2017-09-10 NOTE — Discharge Instructions (Signed)
Please read and follow all provided instructions.  Your diagnoses today include:  1. Herpes zoster without complication   2. New onset of headaches     Tests performed today include:  Blood counts and electrolytes  Coumadin level = 2.29  Vital signs. See below for your results today.   Medications prescribed:   None  Take any prescribed medications only as directed.  Home care instructions:  Follow any educational materials contained in this packet.  Follow-up instructions: Please follow-up with your primary care provider in the next 3 days for further evaluation of your symptoms.   Return instructions:   Please return to the Emergency Department if you experience worsening symptoms.   Return if you have weakness in your arms or legs, slurred speech, trouble walking or talking, confusion, or trouble with your balance.   Please return if you have any other emergent concerns.  Additional Information:  Your vital signs today were: BP 123/72    Pulse 71    Temp (!) 97.4 F (36.3 C) (Oral)    Resp 17    SpO2 98%  If your blood pressure (BP) was elevated above 135/85 this visit, please have this repeated by your doctor within one month. --------------

## 2017-12-09 ENCOUNTER — Ambulatory Visit: Payer: Medicare Other | Admitting: Internal Medicine

## 2017-12-09 ENCOUNTER — Encounter: Payer: Self-pay | Admitting: Internal Medicine

## 2017-12-09 VITALS — BP 124/72 | HR 92 | Ht 72.0 in | Wt 307.0 lb

## 2017-12-09 DIAGNOSIS — I442 Atrioventricular block, complete: Secondary | ICD-10-CM | POA: Diagnosis not present

## 2017-12-09 DIAGNOSIS — I482 Chronic atrial fibrillation: Secondary | ICD-10-CM

## 2017-12-09 DIAGNOSIS — I4821 Permanent atrial fibrillation: Secondary | ICD-10-CM

## 2017-12-09 DIAGNOSIS — Z950 Presence of cardiac pacemaker: Secondary | ICD-10-CM

## 2017-12-09 LAB — CUP PACEART INCLINIC DEVICE CHECK
Battery Impedance: 159 Ohm
Brady Statistic RV Percent Paced: 97 %
Date Time Interrogation Session: 20190123161632
Implantable Lead Implant Date: 20100125
Implantable Lead Implant Date: 20100125
Implantable Lead Location: 753859
Implantable Lead Serial Number: 639547
Lead Channel Impedance Value: 456 Ohm
Lead Channel Impedance Value: 67 Ohm
Lead Channel Setting Pacing Amplitude: 2.5 V
Lead Channel Setting Pacing Pulse Width: 0.4 ms
MDC IDC LEAD LOCATION: 753860
MDC IDC LEAD SERIAL: 475977
MDC IDC MSMT BATTERY REMAINING LONGEVITY: 125 mo
MDC IDC MSMT BATTERY VOLTAGE: 2.79 V
MDC IDC MSMT LEADCHNL RV PACING THRESHOLD AMPLITUDE: 1 V
MDC IDC MSMT LEADCHNL RV PACING THRESHOLD PULSEWIDTH: 0.4 ms
MDC IDC MSMT LEADCHNL RV SENSING INTR AMPL: 8 mV
MDC IDC PG IMPLANT DT: 20160930
MDC IDC SET LEADCHNL RV SENSING SENSITIVITY: 4 mV

## 2017-12-09 NOTE — Patient Instructions (Addendum)
Medication Instructions:  Your physician recommends that you continue on your current medications as directed. Please refer to the Current Medication list given to you today.   Labwork: None ordered.  Testing/Procedures: None ordered.  Follow-Up: Your physician wants you to keep your appointment with Truitt Merle, NP and follow up in One Year with Dr Rayann Heman. You will receive a reminder letter in the mail two months in advance. If you don't receive a letter, please call our office to schedule the follow-up appointment.  Remote monitoring is used to monitor your Pacemaker from home. This monitoring reduces the number of office visits required to check your device to one time per year. It allows [place] to keep an eye on the functioning of your device to ensure it is working properly. You are scheduled for a device check from home on 03/10/2018. You may send your transmission at any time that day. If you have a wireless device, the transmission will be sent automatically. After your physician reviews your transmission, you will receive a postcard with your next transmission date.    Any Other Special Instructions Will Be Listed Below (If Applicable).     If you need a refill on your cardiac medications before your next appointment, please call your pharmacy.

## 2017-12-09 NOTE — Progress Notes (Signed)
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 male who presents today for routine electrophysiology followup.  Since last being seen in our clinic, the patient reports doing reasonably well.  SOB is stable.  He has lost quite a bit of weight.  Edema is stable.  Today, he denies symptoms of palpitations, chest pain, dizziness, presyncope, or syncope.  The patient is otherwise without complaint today.   Past Medical History:  Diagnosis Date  . Arthritis   . Chronic anticoagulation   . Depression   . Gout   . History of echocardiogram    Echo 9/16:  EF 55-60%, no RWMA, mild MR, mild LAE, trivial PI  . HTN (hypertension)   . LV dysfunction    EF down to 30% per echo Feb 2013 -  started on ARB and low dose beta blocker  . Morbid obesity ([place])   . Nephrolithiasis   . Osteoarthritis   . 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 Permanent atrial fibrillation ([place])    failed cardioversion in April of 2010  . Prostate CA ([place])   . Urosepsis    Past Surgical History:  Procedure Laterality Date  . CARDIOVERSION  02/2009   failed  . EP IMPLANTABLE DEVICE N/A 08/17/2015   Procedure: PPM Generator Changeout;  Surgeon: Thompson Grayer, MD;  Location: [facility];  Service: Cardiovascular;  Laterality: N/A;  . PACEMAKER PLACEMENT  2010   [employer]  . RADIOACTIVE SEED IMPLANT    . SHOULDER SURGERY    . TOTAL KNEE ARTHROPLASTY      ROS- all systems are reviewed and negative except as per HPI above  Current Outpatient Medications  Medication Sig Dispense Refill  . allopurinol (ZYLOPRIM) 300 MG tablet TAKE ONE TABLET BY MOUTH ONCE DAILY (Patient taking differently: TAKE 300 MG BY MOUTH ONCE DAILY) 30 tablet 0  . carvedilol (COREG) 3.125 MG tablet TAKE ONE TABLET BY MOUTH TWICE DAILY (Patient taking differently: TAKE 3.125 mg TABLET BY MOUTH TWICE DAILY) 180 tablet 3  . celecoxib (CELEBREX) 200 MG capsule Take 200 mg by mouth every other day.      . hydrochlorothiazide  (HYDRODIURIL) 25 MG tablet TAKE ONE TABLET BY MOUTH ONCE DAILY (Patient taking differently: TAKE 25 mg TABLET in the evening) 90 tablet 2  . losartan (COZAAR) 50 MG tablet TAKE ONE TABLET BY MOUTH ONCE DAILY (Patient taking differently: TAKE 50 mg TABLET BY MOUth in the evening) 90 tablet 3  . metFORMIN (GLUCOPHAGE) 500 MG tablet Take 500 mg by mouth daily with breakfast.     . pravastatin (PRAVACHOL) 20 MG tablet Take 20 mg by mouth at bedtime.     . triamcinolone cream (KENALOG) 0.1 % Apply 1 application topically as needed.    . valACYclovir (VALTREX) 1000 MG tablet Take 1 tablet (1,000 mg total) by mouth 3 (three) times daily. 21 tablet 0  . warfarin (COUMADIN) 5 MG tablet Take as directed by coumadin clinic (Patient taking differently: Take 7.5-10 mg by mouth daily. Take 5  mg by mouth on Wednesday and take 7.5 mg by mouth on all other days) 150 tablet 1   No current facility-administered medications for this visit.     Physical Exam: Vitals:   12/09/17 1549  BP: 124/72  Pulse: 92  Weight: (!) 307 lb (139.3 kg)  Height: 6' (1.829 m)    GEN- The patient is overweight appearing, alert and oriented x 3 today.  Head- normocephalic, atraumatic Eyes-  Sclera clear, conjunctiva pink Ears- hearing intact Oropharynx- clear Lungs- Clear to ausculation bilaterally, normal work of breathing Chest- pacemaker pocket is well healed Heart- Regular rate and rhythm (paced) GI- soft, NT, ND, + BS Extremities- no clubbing, cyanosis, + dependant edema Skin- diffuse ecchymosis  Pacemaker interrogation- reviewed in detail today,  See PACEART report  ekg tracing ordered today is personally reviewed and shows Afib, V paced  Assessment and Plan:  1. Symptomatic complete heart block Normal pacemaker function See Pace Art report No changes today  2. Permanent afib Rate controlled Long term anticoagulation with coumadin  3. Obesity Body mass index is 41.64 kg/m. Lifestyle modification  again encouraged He has lost over 25 lbs in the last year!  Carelink Return to see Cecille Rubin in 6 months I will see in a year  Thompson Grayer MD, [facility] 12/09/2017 3:54 PM

## 2018-02-22 ENCOUNTER — Other Ambulatory Visit: Payer: Self-pay | Admitting: Internal Medicine

## 2018-03-10 ENCOUNTER — Ambulatory Visit (INDEPENDENT_AMBULATORY_CARE_PROVIDER_SITE_OTHER): Payer: Medicare Other | Admitting: *Deleted

## 2018-03-10 ENCOUNTER — Telehealth: Payer: Self-pay | Admitting: Cardiology

## 2018-03-10 DIAGNOSIS — I4821 Permanent atrial fibrillation: Secondary | ICD-10-CM

## 2018-03-10 DIAGNOSIS — I442 Atrioventricular block, complete: Secondary | ICD-10-CM

## 2018-03-10 DIAGNOSIS — I482 Chronic atrial fibrillation: Secondary | ICD-10-CM | POA: Diagnosis not present

## 2018-03-10 NOTE — Telephone Encounter (Signed)
Spoke with pt and reminded pt of remote transmission that is due today. Pt verbalized understanding.   

## 2018-03-12 ENCOUNTER — Encounter: Payer: Self-pay | Admitting: Cardiology

## 2018-03-12 NOTE — Progress Notes (Signed)
Remote pacemaker transmission.   

## 2018-03-18 LAB — CUP PACEART REMOTE DEVICE CHECK
Implantable Lead Implant Date: 20100125
Implantable Lead Location: 753859
Implantable Lead Model: 4470
Implantable Lead Model: 4471
Implantable Lead Serial Number: 475977
Implantable Lead Serial Number: 639547
Implantable Pulse Generator Implant Date: 20160930
Lead Channel Impedance Value: 468 Ohm
Lead Channel Impedance Value: 67 Ohm
Lead Channel Pacing Threshold Amplitude: 1 V
Lead Channel Pacing Threshold Pulse Width: 0.4 ms
Lead Channel Setting Pacing Amplitude: 2.5 V
Lead Channel Setting Sensing Sensitivity: 4 mV
MDC IDC LEAD IMPLANT DT: 20100125
MDC IDC LEAD LOCATION: 753860
MDC IDC MSMT BATTERY IMPEDANCE: 135 Ohm
MDC IDC MSMT BATTERY REMAINING LONGEVITY: 131 mo
MDC IDC MSMT BATTERY VOLTAGE: 2.79 V
MDC IDC SESS DTM: 20190425013049
MDC IDC SET LEADCHNL RV PACING PULSEWIDTH: 0.4 ms
MDC IDC STAT BRADY RV PERCENT PACED: 97 %

## 2018-05-12 ENCOUNTER — Other Ambulatory Visit: Payer: Self-pay | Admitting: Nurse Practitioner

## 2018-05-12 ENCOUNTER — Other Ambulatory Visit: Payer: Self-pay | Admitting: Internal Medicine

## 2018-06-09 ENCOUNTER — Telehealth: Payer: Self-pay | Admitting: Cardiology

## 2018-06-09 ENCOUNTER — Encounter: Payer: Medicare Other | Admitting: *Deleted

## 2018-06-09 NOTE — Telephone Encounter (Signed)
Spoke with pt and reminded pt of remote transmission that is due today. Pt verbalized understanding.   

## 2018-06-10 ENCOUNTER — Encounter: Payer: Self-pay | Admitting: Cardiology

## 2018-06-17 ENCOUNTER — Encounter: Payer: Self-pay | Admitting: Cardiology

## 2018-06-17 ENCOUNTER — Ambulatory Visit (INDEPENDENT_AMBULATORY_CARE_PROVIDER_SITE_OTHER): Payer: Medicare Other | Admitting: *Deleted

## 2018-06-17 DIAGNOSIS — I442 Atrioventricular block, complete: Secondary | ICD-10-CM

## 2018-06-17 NOTE — Progress Notes (Signed)
Remote pacemaker transmission.   

## 2018-06-22 ENCOUNTER — Ambulatory Visit: Payer: Medicare Other | Admitting: Nurse Practitioner

## 2018-07-15 LAB — CUP PACEART REMOTE DEVICE CHECK
Battery Impedance: 159 Ohm
Battery Remaining Longevity: 126 mo
Battery Voltage: 2.79 V
Brady Statistic RV Percent Paced: 98 %
Date Time Interrogation Session: 20190801162647
Implantable Lead Implant Date: 20100125
Implantable Lead Implant Date: 20100125
Implantable Lead Location: 753859
Implantable Lead Location: 753860
Implantable Lead Model: 4470
Implantable Lead Model: 4471
Implantable Lead Serial Number: 475977
Implantable Lead Serial Number: 639547
Implantable Pulse Generator Implant Date: 20160930
Lead Channel Impedance Value: 484 Ohm
Lead Channel Impedance Value: 67 Ohm
Lead Channel Pacing Threshold Amplitude: 1.125 V
Lead Channel Pacing Threshold Pulse Width: 0.4 ms
Lead Channel Setting Pacing Amplitude: 2.5 V
Lead Channel Setting Pacing Pulse Width: 0.4 ms
Lead Channel Setting Sensing Sensitivity: 4 mV

## 2018-08-09 ENCOUNTER — Ambulatory Visit: Payer: Medicare Other | Admitting: Nurse Practitioner

## 2018-08-29 ENCOUNTER — Other Ambulatory Visit: Payer: Self-pay | Admitting: Nurse Practitioner

## 2018-09-09 ENCOUNTER — Other Ambulatory Visit: Payer: Self-pay | Admitting: Internal Medicine

## 2018-09-09 MED ORDER — LOSARTAN POTASSIUM 50 MG PO TABS: 50.0000 mg | ORAL_TABLET | Freq: Every day | ORAL | 0 refills | Status: DC

## 2018-09-16 ENCOUNTER — Telehealth: Payer: Self-pay

## 2018-09-16 ENCOUNTER — Ambulatory Visit (INDEPENDENT_AMBULATORY_CARE_PROVIDER_SITE_OTHER): Payer: Medicare Other | Admitting: *Deleted

## 2018-09-16 DIAGNOSIS — I442 Atrioventricular block, complete: Secondary | ICD-10-CM | POA: Diagnosis not present

## 2018-09-16 NOTE — Telephone Encounter (Signed)
Spoke with pt and reminded pt of remote transmission that is due today. Pt verbalized understanding.   

## 2018-09-17 NOTE — Progress Notes (Signed)
Remote pacemaker transmission.   

## 2018-09-24 ENCOUNTER — Encounter: Payer: Self-pay | Admitting: Cardiology

## 2018-11-16 LAB — CUP PACEART REMOTE DEVICE CHECK
Battery Remaining Longevity: 121 mo
Battery Voltage: 2.79 V
Brady Statistic RV Percent Paced: 99 %
Implantable Lead Model: 4471
Implantable Lead Serial Number: 475977
Implantable Lead Serial Number: 639547
Implantable Pulse Generator Implant Date: 20160930
Lead Channel Impedance Value: 465 Ohm
Lead Channel Pacing Threshold Amplitude: 0.625 V
Lead Channel Pacing Threshold Pulse Width: 0.4 ms
Lead Channel Setting Pacing Amplitude: 2.5 V
Lead Channel Setting Pacing Pulse Width: 0.4 ms
MDC IDC LEAD IMPLANT DT: 20100125
MDC IDC LEAD IMPLANT DT: 20100125
MDC IDC LEAD LOCATION: 753859
MDC IDC LEAD LOCATION: 753860
MDC IDC MSMT BATTERY IMPEDANCE: 183 Ohm
MDC IDC MSMT LEADCHNL RA IMPEDANCE VALUE: 67 Ohm
MDC IDC SESS DTM: 20191101011907
MDC IDC SET LEADCHNL RV SENSING SENSITIVITY: 4 mV

## 2018-11-18 ENCOUNTER — Other Ambulatory Visit: Payer: Self-pay | Admitting: Internal Medicine

## 2018-12-22 ENCOUNTER — Other Ambulatory Visit: Payer: Self-pay | Admitting: Internal Medicine

## 2018-12-27 ENCOUNTER — Encounter: Payer: Self-pay | Admitting: Cardiology

## 2019-01-11 ENCOUNTER — Ambulatory Visit: Payer: Medicare Other | Admitting: *Deleted

## 2019-01-13 LAB — CUP PACEART REMOTE DEVICE CHECK
Battery Impedance: 208 Ohm
Battery Voltage: 2.79 V
Implantable Lead Implant Date: 20100125
Implantable Lead Location: 753859
Implantable Lead Location: 753860
Implantable Lead Model: 4470
Implantable Lead Model: 4471
Implantable Lead Serial Number: 475977
Implantable Pulse Generator Implant Date: 20160930
Lead Channel Impedance Value: 67 Ohm
Lead Channel Pacing Threshold Amplitude: 1 V
Lead Channel Pacing Threshold Pulse Width: 0.4 ms
Lead Channel Setting Pacing Amplitude: 2.5 V
Lead Channel Setting Sensing Sensitivity: 4 mV
MDC IDC LEAD IMPLANT DT: 20100125
MDC IDC LEAD SERIAL: 639547
MDC IDC MSMT BATTERY REMAINING LONGEVITY: 116 mo
MDC IDC MSMT LEADCHNL RV IMPEDANCE VALUE: 465 Ohm
MDC IDC SESS DTM: 20200224153025
MDC IDC SET LEADCHNL RV PACING PULSEWIDTH: 0.4 ms
MDC IDC STAT BRADY RV PERCENT PACED: 99 %

## 2019-02-08 ENCOUNTER — Other Ambulatory Visit: Payer: Self-pay | Admitting: Internal Medicine

## 2019-03-03 ENCOUNTER — Telehealth: Payer: Self-pay | Admitting: Internal Medicine

## 2019-03-03 NOTE — Telephone Encounter (Signed)
New message    Called patient about scheduling virtual visit with [name] for past due follow up-recall. No VM set up.

## 2019-03-31 ENCOUNTER — Other Ambulatory Visit: Payer: Self-pay | Admitting: Internal Medicine

## 2019-05-01 ENCOUNTER — Other Ambulatory Visit: Payer: Self-pay | Admitting: Internal Medicine

## 2019-06-23 ENCOUNTER — Telehealth: Payer: Self-pay | Admitting: *Deleted

## 2019-06-23 ENCOUNTER — Other Ambulatory Visit: Payer: Self-pay | Admitting: *Deleted

## 2019-06-23 NOTE — Telephone Encounter (Signed)
Spoke w/ pt and he stated that he knew how to use his home monitor and he would send a remote transmission later today. He was questioning an alert that he received on his phone about 2 abnormal results about his device. Pt does not have mychart and last device check was 01/10/2019 and it was normal at that time. Informed pt of this and he verbalized understanding and stated that he would send a remote transmission in a little bit.

## 2019-06-23 NOTE — Telephone Encounter (Signed)
Patient does not have smart phone, internet access, or access to a computer.  Patient agrees with a telephone visit.  Patient is currently having issues with his device monitor and would like a call back from [facility].  I will forward message to device        Virtual Visit Pre-Appointment Phone Call  "(Name), I am calling you today to discuss your upcoming appointment. We are currently trying to limit exposure to the virus that causes COVID-19 by seeing patients at home rather than in the office."  1. "What is the BEST phone number to call the day of the visit?" - include this in appointment notes  2. "Do you have or have access to (through a family member/friend) a smartphone with video capability that we can use for your visit?" a. If yes - list this number in appt notes as "cell" (if different from BEST phone #) and list the appointment type as a VIDEO visit in appointment notes b. If no - list the appointment type as a PHONE visit in appointment notes  3. Confirm consent - "In the setting of the current Covid19 crisis, you are scheduled for a (phone or video) visit with your provider on (date) at (time).  Just as we do with many in-office visits, in order for you to participate in this visit, we must obtain consent.  If you'd like, I can send this to your mychart (if signed up) or email for you to review.  Otherwise, I can obtain your verbal consent now.  All virtual visits are billed to your insurance company just like a normal visit would be.  By agreeing to a virtual visit, we'd like you to understand that the technology does not allow for your provider to perform an examination, and thus may limit your provider's ability to fully assess your condition. If your provider identifies any concerns that need to be evaluated in person, we will make arrangements to do so.  Finally, though the technology is pretty good, we cannot assure that it will always work on either your or our end, and  in the setting of a video visit, we may have to convert it to a phone-only visit.  In either situation, we cannot ensure that we have a secure connection.  Are you willing to proceed?" STAFF: Did the patient verbally acknowledge consent to telehealth visit? Document YES/NO here: YES  4. Advise patient to be prepared - "Two hours prior to your appointment, go ahead and check your blood pressure, pulse, oxygen saturation, and your weight (if you have the equipment to check those) and write them all down. When your visit starts, your provider will ask you for this information. If you have an Apple Watch or Kardia device, please plan to have heart rate information ready on the day of your appointment. Please have a pen and paper handy nearby the day of the visit as well."  5. Give patient instructions for MyChart download to smartphone OR Doximity/Doxy.me as below if video visit (depending on what platform provider is using)  6. Inform patient they will receive a phone call 15 minutes prior to their appointment time (may be from unknown caller ID) so they should be prepared to answer    TELEPHONE CALL NOTE  [name] has been deemed a candidate for a follow-up tele-health visit to limit community exposure during the Covid-19 pandemic. I spoke with the patient via phone to ensure availability of phone/video source, confirm preferred email &  phone number, and discuss instructions and expectations.  I reminded [name] to be prepared with any vital sign and/or heart rhythm information that could potentially be obtained via home monitoring, at the time of his visit. I reminded [name] to expect a phone call prior to his visit.  Kamilya Wakeman 06/23/2019 11:21 AM   INSTRUCTIONS FOR DOWNLOADING THE MYCHART APP TO SMARTPHONE  - The patient must first make sure to have activated MyChart and know their login information - If Apple, go to [employer] and type in MyChart in the search bar and  download the app. If Android, ask patient to go to [employer] and type in [place] in the search bar and download the app. The app is free but as with any other app downloads, their phone may require them to verify saved payment information or Apple/Android password.  - The patient will need to then log into the app with their MyChart username and password, and select [facility] as their healthcare provider to link the account. When it is time for your visit, go to the MyChart app, find appointments, and click Begin Video Visit. Be sure to Select Allow for your device to access the Microphone and Camera for your visit. You will then be connected, and your provider will be with you shortly.  **If they have any issues connecting, or need assistance please contact MyChart service desk [phone])**  **If using a computer, in order to ensure the best quality for their visit they will need to use either of the following Internet Browsers: [employer], or Google Chrome**  IF USING DOXIMITY or DOXY.ME - The patient will receive a link just prior to their visit by text.     FULL LENGTH CONSENT FOR TELE-HEALTH VISIT   I hereby voluntarily request, consent and authorize [facility] and its employed or contracted physicians, physician assistants, nurse practitioners or other licensed health care professionals (the Practitioner), to provide me with telemedicine health care services (the "Services") as deemed necessary by the treating Practitioner. I acknowledge and consent to receive the Services by the Practitioner via telemedicine. I understand that the telemedicine visit will involve communicating with the Practitioner through live audiovisual communication technology and the disclosure of certain medical information by electronic transmission. I acknowledge that I have been given the opportunity to request an in-person assessment or other available alternative prior to the  telemedicine visit and am voluntarily participating in the telemedicine visit.  I understand that I have the right to withhold or withdraw my consent to the use of telemedicine in the course of my care at any time, without affecting my right to future care or treatment, and that the Practitioner or I may terminate the telemedicine visit at any time. I understand that I have the right to inspect all information obtained and/or recorded in the course of the telemedicine visit and may receive copies of available information for a reasonable fee.  I understand that some of the potential risks of receiving the Services via telemedicine include:  Marland Kitchen Delay or interruption in medical evaluation due to technological equipment failure or disruption; . Information transmitted may not be sufficient (e.g. poor resolution of images) to allow for appropriate medical decision making by the Practitioner; and/or  . In rare instances, security protocols could fail, causing a breach of personal health information.  Furthermore, I acknowledge that it is my responsibility to provide information about my medical history, conditions and care that is  complete and accurate to the best of my ability. I acknowledge that Practitioner's advice, recommendations, and/or decision may be based on factors not within their control, such as incomplete or inaccurate data provided by me or distortions of diagnostic images or specimens that may result from electronic transmissions. I understand that the practice of medicine is not an exact science and that Practitioner makes no warranties or guarantees regarding treatment outcomes. I acknowledge that I will receive a copy of this consent concurrently upon execution via email to the email address I last provided but may also request a printed copy by calling the office of [facility].    I understand that my insurance will be billed for this visit.   I have read or had this consent read to me.  . I understand the contents of this consent, which adequately explains the benefits and risks of the Services being provided via telemedicine.  . I have been provided ample opportunity to ask questions regarding this consent and the Services and have had my questions answered to my satisfaction. . I give my informed consent for the services to be provided through the use of telemedicine in my medical care  By participating in this telemedicine visit I agree to the above.

## 2019-06-27 ENCOUNTER — Telehealth (INDEPENDENT_AMBULATORY_CARE_PROVIDER_SITE_OTHER): Payer: Medicare Other | Admitting: Internal Medicine

## 2019-06-27 ENCOUNTER — Other Ambulatory Visit: Payer: Self-pay

## 2019-06-27 ENCOUNTER — Encounter: Payer: Self-pay | Admitting: Internal Medicine

## 2019-06-27 VITALS — BP 107/70 | HR 75 | Ht 72.0 in | Wt 280.0 lb

## 2019-06-27 DIAGNOSIS — Z6837 Body mass index (BMI) 37.0-37.9, adult: Secondary | ICD-10-CM

## 2019-06-27 DIAGNOSIS — E669 Obesity, unspecified: Secondary | ICD-10-CM | POA: Diagnosis not present

## 2019-06-27 DIAGNOSIS — I442 Atrioventricular block, complete: Secondary | ICD-10-CM | POA: Diagnosis not present

## 2019-06-27 DIAGNOSIS — I4821 Permanent atrial fibrillation: Secondary | ICD-10-CM

## 2019-06-27 DIAGNOSIS — Z950 Presence of cardiac pacemaker: Secondary | ICD-10-CM

## 2019-06-27 DIAGNOSIS — Z7901 Long term (current) use of anticoagulants: Secondary | ICD-10-CM

## 2019-06-27 NOTE — Progress Notes (Signed)
Electrophysiology TeleHealth Note   Due to national recommendations of social distancing due to [place] 19, an audio telehealth visit is felt to be most appropriate for this patient at this time.  Verbal consent was obtained by me for the telehealth visit today.  The patient does not have capability for a virtual visit.  A phone visit is therefore required today.   Date:  06/27/2019   ID:  [name], DOB Oct 04, 1941, MRN [id number]  Location: patient's home  Provider location:  [facility]  Evaluation Performed: Follow-up visit  PCP:  Shon Baton, MD   Electrophysiologist:  Dr Rayann Heman  Chief Complaint:  edema  History of Present Illness:    [name] is a 78 y.o. male who presents via telehealth conferencing today.  Since last being seen in our clinic, the patient reports doing very well.  SOB and edema are stable.  He fell a month ago (mechanical fall).  Today, he denies symptoms of palpitations, chest pain, dizziness, presyncope, or syncope.  The patient is otherwise without complaint today.  The patient denies symptoms of fevers, chills, cough, or new SOB worrisome for COVID 19.  Past Medical History:  Diagnosis Date  . Arthritis   . Chronic anticoagulation   . Depression   . Gout   . History of echocardiogram    Echo 9/16:  EF 55-60%, no RWMA, mild MR, mild LAE, trivial PI  . HTN (hypertension)   . LV dysfunction    EF down to 30% per echo Feb 2013 -  started on ARB and low dose beta blocker  . Morbid obesity ([place])   . Nephrolithiasis   . Osteoarthritis   . 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 Permanent atrial fibrillation    failed cardioversion in April of 2010  . Prostate CA ([place])   . Urosepsis     Past Surgical History:  Procedure Laterality Date  . CARDIOVERSION  02/2009   failed  . EP IMPLANTABLE DEVICE N/A 08/17/2015   Procedure: PPM Generator Changeout;  Surgeon: Thompson Grayer, MD;  Location: [facility];  Service: Cardiovascular;  Laterality: N/A;  .  PACEMAKER PLACEMENT  2010   [employer]  . RADIOACTIVE SEED IMPLANT    . SHOULDER SURGERY    . TOTAL KNEE ARTHROPLASTY      Current Outpatient Medications  Medication Sig Dispense Refill  . allopurinol (ZYLOPRIM) 300 MG tablet TAKE ONE TABLET BY MOUTH ONCE DAILY (Patient taking differently: TAKE 300 MG BY MOUTH ONCE DAILY) 30 tablet 0  . carvedilol (COREG) 3.125 MG tablet Take 1 tablet (3.125 mg total) by mouth 2 (two) times daily. Please schedule overdue appt for future refills. 2nd attempt. 30 tablet 0  . celecoxib (CELEBREX) 200 MG capsule Take 200 mg by mouth every other day.      . hydrochlorothiazide (HYDRODIURIL) 25 MG tablet Take 1 tablet by mouth once daily 90 tablet 0  . JANUVIA 100 MG tablet Take 100 mg by mouth every other day.    . losartan (COZAAR) 50 MG tablet Take 1 tablet by mouth once daily 90 tablet 0  . metFORMIN (GLUCOPHAGE) 500 MG tablet Take 500 mg by mouth daily with breakfast.     . pravastatin (PRAVACHOL) 20 MG tablet Take 20 mg by mouth at bedtime.     . triamcinolone cream (KENALOG) 0.1 % Apply 1 application topically as needed.    . valACYclovir (VALTREX) 1000 MG tablet Take 1 tablet (1,000 mg total) by mouth  3 (three) times daily. 21 tablet 0  . warfarin (COUMADIN) 5 MG tablet Take as directed by coumadin clinic (Patient taking differently: Take 7.5-10 mg by mouth daily. Take 5  mg by mouth on Wednesday and take 7.5 mg by mouth on all other days) 150 tablet 1   No current facility-administered medications for this visit.     Allergies:   Oxycodone and Sertraline hcl   Social History:  The patient  reports that he has never smoked. He has never used smokeless tobacco. He reports that he does not drink alcohol or use drugs.   Family History:  The patient's family history includes Healthy in his brother and mother; Heart attack (age of onset: 52) in his father.   ROS:  Please see the history of present illness.   All other systems are personally reviewed and  negative.    Exam:    Vital Signs:  BP 107/70   Pulse 75   Ht 6' (1.829 m)   Wt 280 lb (127 kg)   BMI 37.97 kg/m   Well sounding    Labs/Other Tests and Data Reviewed:    Recent Labs: No results found for requested labs within last 8760 hours.   Wt Readings from Last 3 Encounters:  06/27/19 280 lb (127 kg)  12/09/17 (!) 307 lb (139.3 kg)  06/17/17 (!) 314 lb 9.6 oz (142.7 kg)     Last device remote is reviewed from [place] PDF which reveals normal device function, 12/2018   ASSESSMENT & PLAN:    1.  Complete heart block Normal device function Overdue for remote  2. Permanent afib Rate controlled On coumadin  3. Obesity He has lost from 340 down to 280 lbs!   Follow-up:  Truitt Merle in 6 months I will see in a year   Patient Risk:  after full review of this patients clinical status, I feel that they are at moderate risk at this time.  Today, I have spent 15 minutes with the patient with telehealth technology discussing arrhythmia management .    Army Fossa, MD  06/27/2019 4:15 PM     [address] [place] [address] [phone] (office) [phone] (fax)

## 2019-06-28 ENCOUNTER — Telehealth: Payer: Self-pay

## 2019-06-28 NOTE — Telephone Encounter (Signed)
I let the pt know we still have not received the transmission he was trying to send. Pt states he is out of town for 9 days and when he get back into town he will send the transmission.

## 2019-07-11 NOTE — Telephone Encounter (Signed)
No answer no voice mail  

## 2019-07-12 NOTE — Telephone Encounter (Signed)
I spoke with the pt and he agreed to send a transmission in the morning.

## 2019-07-13 ENCOUNTER — Ambulatory Visit (INDEPENDENT_AMBULATORY_CARE_PROVIDER_SITE_OTHER): Payer: Medicare Other | Admitting: *Deleted

## 2019-07-13 DIAGNOSIS — I442 Atrioventricular block, complete: Secondary | ICD-10-CM | POA: Diagnosis not present

## 2019-07-13 LAB — CUP PACEART REMOTE DEVICE CHECK
Battery Impedance: 232 Ohm
Battery Remaining Longevity: 113 mo
Battery Voltage: 2.78 V
Brady Statistic RV Percent Paced: 99 %
Date Time Interrogation Session: 20200826144926
Implantable Lead Implant Date: 20100125
Implantable Lead Implant Date: 20100125
Implantable Lead Location: 753859
Implantable Lead Location: 753860
Implantable Lead Model: 4470
Implantable Lead Model: 4471
Implantable Lead Serial Number: 475977
Implantable Lead Serial Number: 639547
Implantable Pulse Generator Implant Date: 20160930
Lead Channel Impedance Value: 481 Ohm
Lead Channel Impedance Value: 67 Ohm
Lead Channel Pacing Threshold Amplitude: 1 V
Lead Channel Pacing Threshold Pulse Width: 0.4 ms
Lead Channel Setting Pacing Amplitude: 2.5 V
Lead Channel Setting Pacing Pulse Width: 0.4 ms
Lead Channel Setting Sensing Sensitivity: 4 mV

## 2019-07-13 NOTE — Telephone Encounter (Signed)
Transmission received 07-13-2019

## 2019-07-14 NOTE — Telephone Encounter (Signed)
Added to schedule for processing. 

## 2019-07-15 ENCOUNTER — Other Ambulatory Visit: Payer: Self-pay

## 2019-07-22 ENCOUNTER — Encounter: Payer: Self-pay | Admitting: Cardiology

## 2019-07-22 NOTE — Progress Notes (Signed)
Remote pacemaker transmission.   

## 2019-08-11 ENCOUNTER — Other Ambulatory Visit: Payer: Self-pay | Admitting: Internal Medicine

## 2019-09-01 ENCOUNTER — Other Ambulatory Visit: Payer: Self-pay | Admitting: Internal Medicine

## 2019-11-30 ENCOUNTER — Other Ambulatory Visit: Payer: Self-pay | Admitting: Internal Medicine

## 2020-04-12 ENCOUNTER — Telehealth: Payer: Self-pay

## 2020-04-12 NOTE — Telephone Encounter (Signed)
Call recorded manually during systemwide Epic crash 1:23 PM - Returned call to patient to make him aware of missed remote transmission, however he states he attempted to send transmission in. Patient assumes his transmission may not have gone through. Attempted to contact device however, no answer. Called again, sending patient to device clinic voice mail. Advised that patient to leave a voice message making device team aware that he attempted to submit transmission.

## 2020-04-12 NOTE — Telephone Encounter (Signed)
I called the pt and he was not near his monitor. I offered him the number to tech support but he could not write it down. I tried to call him back to leave on  the voicemail and his voicemail is not set up.

## 2020-04-12 NOTE — Telephone Encounter (Signed)
Unable to speak  with patient to remind of missed remote transmission 

## 2020-04-19 ENCOUNTER — Telehealth: Payer: Self-pay

## 2020-04-19 NOTE — Telephone Encounter (Signed)
Attempted to call patient regarding home transmitter box. No answer. Did not leave VM as requested in DPR.

## 2020-04-27 ENCOUNTER — Telehealth: Payer: Self-pay | Admitting: Internal Medicine

## 2020-04-27 ENCOUNTER — Encounter: Payer: Self-pay | Admitting: *Deleted

## 2020-04-27 NOTE — Telephone Encounter (Signed)
Spoke with pt.  He is out of town right now.  States that the last time he tried to send a manual transmission he got an error message.  Advised pt to call our office when he is home so we can trouble shoot monitor.  Pt reports he already tried calling [employer] directly and was on hold for too long.

## 2020-04-27 NOTE — Telephone Encounter (Signed)
Third attempt:  Attempted to reach patient at both home and cell numbers as PPM transmission has not yet been received. Unable to LM at either number. Will mail letter.

## 2020-04-27 NOTE — Telephone Encounter (Signed)
New Message  Pt returned call to [facility] Call was transferred to [facility]

## 2020-04-30 ENCOUNTER — Telehealth: Payer: Self-pay

## 2020-04-30 NOTE — Telephone Encounter (Signed)
I think the pt left a message that he needs to speak with someone. I saw that he called twice so I wanted to see if he called about his device. No answer/voicemail not set up.

## 2020-05-01 ENCOUNTER — Ambulatory Visit (INDEPENDENT_AMBULATORY_CARE_PROVIDER_SITE_OTHER): Payer: Medicare Other | Admitting: *Deleted

## 2020-05-01 DIAGNOSIS — I442 Atrioventricular block, complete: Secondary | ICD-10-CM | POA: Diagnosis not present

## 2020-05-02 LAB — CUP PACEART REMOTE DEVICE CHECK
Battery Impedance: 257 Ohm
Battery Remaining Longevity: 111 mo
Battery Voltage: 2.79 V
Brady Statistic RV Percent Paced: 99 %
Date Time Interrogation Session: 20210615100323
Implantable Lead Implant Date: 20100125
Implantable Lead Implant Date: 20100125
Implantable Lead Location: 753859
Implantable Lead Location: 753860
Implantable Lead Model: 4470
Implantable Lead Model: 4471
Implantable Lead Serial Number: 475977
Implantable Lead Serial Number: 639547
Implantable Pulse Generator Implant Date: 20160930
Lead Channel Impedance Value: 507 Ohm
Lead Channel Impedance Value: 67 Ohm
Lead Channel Pacing Threshold Amplitude: 0.875 V
Lead Channel Pacing Threshold Pulse Width: 0.4 ms
Lead Channel Setting Pacing Amplitude: 2.5 V
Lead Channel Setting Pacing Pulse Width: 0.4 ms
Lead Channel Setting Sensing Sensitivity: 4 mV

## 2020-05-02 NOTE — Progress Notes (Signed)
Remote pacemaker transmission.   

## 2020-08-08 ENCOUNTER — Telehealth: Payer: Self-pay

## 2020-08-08 NOTE — Telephone Encounter (Signed)
-----   Message from Rosalyn Charters, RN sent at 08/07/2020  6:19 PM EDT ----- Regarding: overdue transmission Please call [name] to assist with overdue Carelink transmission.  Thanks! Raquel Sarna

## 2020-08-08 NOTE — Telephone Encounter (Signed)
I spoke with the pt to help him send a transmission. He was at the [facility] at the time. He states his legs was hurting and could barely stand up. I told him I will give him a call tomorrow to help him send a transmission or to get him a new one ordered.

## 2020-08-09 NOTE — Telephone Encounter (Signed)
[employer] is sending him a new handheld.

## 2020-08-15 ENCOUNTER — Other Ambulatory Visit: Payer: Self-pay | Admitting: Internal Medicine

## 2020-08-22 NOTE — Telephone Encounter (Signed)
Calling patient to see if he has received handheld device.  No answer, unable to leave VM d/t not set up.

## 2020-08-28 NOTE — Telephone Encounter (Signed)
The pt received his handheld yesterday. Transmission received 08/28/2020

## 2020-08-29 ENCOUNTER — Ambulatory Visit (INDEPENDENT_AMBULATORY_CARE_PROVIDER_SITE_OTHER): Payer: Medicare Other

## 2020-08-29 DIAGNOSIS — I442 Atrioventricular block, complete: Secondary | ICD-10-CM

## 2020-08-30 ENCOUNTER — Other Ambulatory Visit: Payer: Self-pay | Admitting: Internal Medicine

## 2020-08-31 LAB — CUP PACEART REMOTE DEVICE CHECK
Battery Impedance: 306 Ohm
Battery Remaining Longevity: 103 mo
Battery Voltage: 2.79 V
Brady Statistic RV Percent Paced: 99 %
Date Time Interrogation Session: 20211012121923
Implantable Lead Implant Date: 20100125
Implantable Lead Implant Date: 20100125
Implantable Lead Location: 753859
Implantable Lead Location: 753860
Implantable Lead Model: 4470
Implantable Lead Model: 4471
Implantable Lead Serial Number: 475977
Implantable Lead Serial Number: 639547
Implantable Pulse Generator Implant Date: 20160930
Lead Channel Impedance Value: 467 Ohm
Lead Channel Impedance Value: 67 Ohm
Lead Channel Pacing Threshold Amplitude: 1 V
Lead Channel Pacing Threshold Pulse Width: 0.4 ms
Lead Channel Setting Pacing Amplitude: 2.5 V
Lead Channel Setting Pacing Pulse Width: 0.4 ms
Lead Channel Setting Sensing Sensitivity: 4 mV

## 2020-09-04 NOTE — Progress Notes (Signed)
Remote pacemaker transmission.   

## 2020-09-05 ENCOUNTER — Other Ambulatory Visit: Payer: Self-pay | Admitting: Internal Medicine

## 2020-09-18 ENCOUNTER — Other Ambulatory Visit: Payer: Self-pay | Admitting: Internal Medicine

## 2020-09-27 ENCOUNTER — Other Ambulatory Visit: Payer: Self-pay | Admitting: Internal Medicine

## 2020-09-29 ENCOUNTER — Other Ambulatory Visit: Payer: Self-pay | Admitting: Internal Medicine

## 2020-11-28 ENCOUNTER — Ambulatory Visit (INDEPENDENT_AMBULATORY_CARE_PROVIDER_SITE_OTHER): Payer: Medicare Other

## 2020-11-28 DIAGNOSIS — I442 Atrioventricular block, complete: Secondary | ICD-10-CM

## 2020-11-30 LAB — CUP PACEART REMOTE DEVICE CHECK
Battery Impedance: 354 Ohm
Battery Remaining Longevity: 98 mo
Battery Voltage: 2.78 V
Brady Statistic RV Percent Paced: 99 %
Date Time Interrogation Session: 20220114103633
Implantable Lead Implant Date: 20100125
Implantable Lead Implant Date: 20100125
Implantable Lead Location: 753859
Implantable Lead Location: 753860
Implantable Lead Model: 4470
Implantable Lead Model: 4471
Implantable Lead Serial Number: 475977
Implantable Lead Serial Number: 639547
Implantable Pulse Generator Implant Date: 20160930
Lead Channel Impedance Value: 447 Ohm
Lead Channel Impedance Value: 67 Ohm
Lead Channel Pacing Threshold Amplitude: 0.875 V
Lead Channel Pacing Threshold Pulse Width: 0.4 ms
Lead Channel Setting Pacing Amplitude: 2.5 V
Lead Channel Setting Pacing Pulse Width: 0.4 ms
Lead Channel Setting Sensing Sensitivity: 4 mV

## 2020-12-11 NOTE — Progress Notes (Signed)
Remote pacemaker transmission.   

## 2021-02-27 ENCOUNTER — Ambulatory Visit (INDEPENDENT_AMBULATORY_CARE_PROVIDER_SITE_OTHER): Payer: Medicare Other

## 2021-02-27 DIAGNOSIS — I442 Atrioventricular block, complete: Secondary | ICD-10-CM | POA: Diagnosis not present

## 2021-02-28 LAB — CUP PACEART REMOTE DEVICE CHECK
Battery Impedance: 354 Ohm
Battery Remaining Longevity: 98 mo
Battery Voltage: 2.78 V
Brady Statistic RV Percent Paced: 99 %
Date Time Interrogation Session: 20220414104001
Implantable Lead Implant Date: 20100125
Implantable Lead Implant Date: 20100125
Implantable Lead Location: 753859
Implantable Lead Location: 753860
Implantable Lead Model: 4470
Implantable Lead Model: 4471
Implantable Lead Serial Number: 475977
Implantable Lead Serial Number: 639547
Implantable Pulse Generator Implant Date: 20160930
Lead Channel Impedance Value: 446 Ohm
Lead Channel Impedance Value: 67 Ohm
Lead Channel Pacing Threshold Amplitude: 1.125 V
Lead Channel Pacing Threshold Pulse Width: 0.4 ms
Lead Channel Setting Pacing Amplitude: 2.5 V
Lead Channel Setting Pacing Pulse Width: 0.4 ms
Lead Channel Setting Sensing Sensitivity: 4 mV

## 2021-03-14 NOTE — Progress Notes (Signed)
Remote pacemaker transmission.   

## 2021-04-18 ENCOUNTER — Telehealth: Payer: Self-pay | Admitting: *Deleted

## 2021-04-18 NOTE — Telephone Encounter (Signed)
   Name: [name]  DOB: 21-Feb-1941  MRN: [id number]  Primary Cardiologist: None  Chart reviewed as part of pre-operative protocol coverage. Because of [name]'s past medical history and time since last visit, he will require a follow-up visit in order to better assess preoperative cardiovascular risk.  Visit has been scheduled for 04/25/21 with Oda Kilts and appointment notes updated. Will route to provider as FYI. Will route to requesting party to make them aware recommendations will come at time of clinic visit and remove from preop pool.  His primary care provider, Dr. Shon Baton, manages his Warfarin and recommendations regarding holding will need to come from his office. Will route to PCP as FYI.   Loel Dubonnet, NP  04/18/2021, 2:15 PM

## 2021-04-18 NOTE — Telephone Encounter (Signed)
Patient with diagnosis of A Fib on warfarin  for anticoagulation.    Procedure: Right TKA Date of procedure: 05/14/21  We do not manage patients warfarin/INR.  Also appears patient has not seen a cardiologist here since a telemedicine visit 06/27/19

## 2021-04-18 NOTE — Telephone Encounter (Signed)
   [facility] Pre-operative Risk Assessment    Patient Name: [name]  DOB: 1941/04/02  MRN: [id number]   HEARTCARE STAFF: - Please ensure there is not already an duplicate clearance open for this procedure. - Under Visit Info/Reason for Call, type in Other and utilize the format Clearance MM/DD/YY or Clearance TBD. Do not use dashes or single digits. - If request is for dental extraction, please clarify the # of teeth to be extracted. - If the patient is currently at the dentist's office, call Pre-Op APP to address. If the patient is not currently in the dentist office, please route to the Pre-Op pool  Request for surgical clearance:  1. What type of surgery is being performed? RIGHT TOTAL KNEE ARTHROPLASTY   2. When is this surgery scheduled? 05/14/21   3. What type of clearance is required (medical clearance vs. Pharmacy clearance to hold med vs. Both)? BOTH AS WELL AS DEVICE CLEARANCE NEEDED  4. Are there any medications that need to be held prior to surgery and how long? COUMADIN   5. Practice name and name of physician performing surgery? [facility]; DR. Corazon   6. What is the office phone number? [phone]   7.   What is the office fax number? [phone]  8.   Anesthesia type (None, local, MAC, general) ? SPINAL   Julaine Hua 04/18/2021, 11:11 AM  _________________________________________________________________   (provider comments below)

## 2021-04-25 ENCOUNTER — Encounter: Payer: Self-pay | Admitting: Student

## 2021-04-25 ENCOUNTER — Ambulatory Visit: Payer: Medicare Other | Admitting: Student

## 2021-04-25 ENCOUNTER — Other Ambulatory Visit: Payer: Self-pay

## 2021-04-25 VITALS — BP 108/68 | HR 75 | Ht 71.5 in | Wt 276.0 lb

## 2021-04-25 DIAGNOSIS — I442 Atrioventricular block, complete: Secondary | ICD-10-CM

## 2021-04-25 DIAGNOSIS — Z950 Presence of cardiac pacemaker: Secondary | ICD-10-CM | POA: Diagnosis not present

## 2021-04-25 DIAGNOSIS — I4821 Permanent atrial fibrillation: Secondary | ICD-10-CM | POA: Diagnosis not present

## 2021-04-25 LAB — CUP PACEART INCLINIC DEVICE CHECK
Battery Impedance: 379 Ohm
Battery Remaining Longevity: 98 mo
Battery Voltage: 2.78 V
Brady Statistic RV Percent Paced: 99 %
Date Time Interrogation Session: 20220609134424
Implantable Lead Implant Date: 20100125
Implantable Lead Implant Date: 20100125
Implantable Lead Location: 753859
Implantable Lead Location: 753860
Implantable Lead Model: 4470
Implantable Lead Model: 4471
Implantable Lead Serial Number: 475977
Implantable Lead Serial Number: 639547
Implantable Pulse Generator Implant Date: 20160930
Lead Channel Impedance Value: 504 Ohm
Lead Channel Impedance Value: 67 Ohm
Lead Channel Pacing Threshold Amplitude: 1 V
Lead Channel Pacing Threshold Amplitude: 1.25 V
Lead Channel Pacing Threshold Pulse Width: 0.4 ms
Lead Channel Pacing Threshold Pulse Width: 0.4 ms
Lead Channel Setting Pacing Amplitude: 2.5 V
Lead Channel Setting Pacing Pulse Width: 0.4 ms
Lead Channel Setting Sensing Sensitivity: 4 mV

## 2021-04-25 NOTE — Patient Instructions (Signed)
Medication Instructions:  Your physician recommends that you continue on your current medications as directed. Please refer to the Current Medication list given to you today.  *If you need a refill on your cardiac medications before your next appointment, please call your pharmacy*   Lab Work: TODAY: BMET, CBC  If you have labs (blood work) drawn today and your tests are completely normal, you will receive your results only by: [place] (if you have MyChart) OR A paper copy in the mail If you have any lab test that is abnormal or we need to change your treatment, we will call you to review the results.   Follow-Up: At [facility], you and your health needs are our priority.  As part of our continuing mission to provide you with exceptional heart care, we have created designated Provider Care Teams.  These Care Teams include your primary Cardiologist (physician) and Advanced Practice Providers (APPs -  Physician Assistants and Nurse Practitioners) who all work together to provide you with the care you need, when you need it.  We recommend signing up for the patient portal called "MyChart".  Sign up information is provided on this After Visit Summary.  MyChart is used to connect with patients for Virtual Visits (Telemedicine).  Patients are able to view lab/test results, encounter notes, upcoming appointments, etc.  Non-urgent messages can be sent to your provider as well.   To learn more about what you can do with MyChart, go to [email or web address].    Your next appointment:   1 year(s)  The format for your next appointment:   In Person  Provider:   You may see Thompson Grayer, MD or one of the following Advanced Practice Providers on your designated Care Team:   Legrand Como "Jonni Sanger" [place], [place]

## 2021-04-25 NOTE — Progress Notes (Signed)
Electrophysiology Office Note Date: 04/25/2021  ID:  [name], DOB [birth year], MRN [id number]  PCP: Shon Baton, MD Primary Cardiologist: None Electrophysiologist: Thompson Grayer, MD   CC: Pacemaker follow-up  [name] is a 80 y.o. male seen today for Thompson Grayer, MD for cardiac clearance for total knee arthroplasty.  Since last being seen in our clinic the patient reports doing well overall. He had Left knee surgery >20 years ago and it's "time for the right one".  He denies any undue SOB, chest pain, or lightheadedness/dizziness. No syncope or near syncope.   Device History: Medtronic Dual Chamber PPM implanted 2010, gen change 2016 for SSS  Past Medical History:  Diagnosis Date   Arthritis    Chronic anticoagulation    Depression    Gout    History of echocardiogram    Echo 9/16:  EF 55-60%, no RWMA, mild MR, mild LAE, trivial PI   HTN (hypertension)    LV dysfunction    EF down to 30% per echo Feb 2013 -  started on ARB and low dose beta blocker   Morbid obesity ([place])    Nephrolithiasis    Osteoarthritis    Pacemaker January 2010   Permanent atrial fibrillation [facility])    failed cardioversion in April of 2010   Prostate CA [facility])    Urosepsis    Past Surgical History:  Procedure Laterality Date   CARDIOVERSION  02/2009   failed   EP IMPLANTABLE DEVICE N/A 08/17/2015   Procedure: PPM Generator Changeout;  Surgeon: Thompson Grayer, MD;  Location: [place] [facility];  Service: Cardiovascular;  Laterality: N/A;   PACEMAKER PLACEMENT  2010   Medtronic   RADIOACTIVE SEED IMPLANT     SHOULDER SURGERY     TOTAL KNEE ARTHROPLASTY      Current Outpatient Medications  Medication Sig Dispense Refill   allopurinol (ZYLOPRIM) 300 MG tablet TAKE ONE TABLET BY MOUTH ONCE DAILY 30 tablet 0   carvedilol (COREG) 3.125 MG tablet Take 1 tablet (3.125 mg total) by mouth 2 (two) times daily. Please schedule overdue appt for future refills. 2nd attempt. 30 tablet 0   celecoxib  (CELEBREX) 200 MG capsule Take 200 mg by mouth every other day.       furosemide (LASIX) 20 MG tablet Take by mouth as needed.     hydrochlorothiazide (HYDRODIURIL) 25 MG tablet Take 1 tablet by mouth once daily 30 tablet 0   JANUVIA 100 MG tablet Take 100 mg by mouth every other day.     losartan (COZAAR) 50 MG tablet TAKE 1 TABLET BY MOUTH ONCE DAILY.  PLEASE MAKE APPT WITH DR. ALLRED BEFORE ANYMORE REFILLS. 15 tablet 1   metFORMIN (GLUCOPHAGE) 500 MG tablet Take 500 mg by mouth daily with breakfast.      pravastatin (PRAVACHOL) 20 MG tablet Take 20 mg by mouth at bedtime.      triamcinolone cream (KENALOG) 0.1 % Apply 1 application topically as needed.     warfarin (COUMADIN) 5 MG tablet Take as directed by coumadin clinic (Patient taking differently: Take 7.5-10 mg by mouth daily. Take 5  mg by mouth on Wednesday and take 7.5 mg by mouth on all other days) 150 tablet 1   No current facility-administered medications for this visit.    Allergies:   Oxycodone and Sertraline hcl   Social History: Social History   Socioeconomic History   Marital status: Widowed    Spouse name: Not on file  Number of children: 1   Years of education: Not on file   Highest education level: Not on file  Occupational History   Occupation: Retired Magazine features editor: RETIRED  Tobacco Use   Smoking status: Never   Smokeless tobacco: Never  Vaping Use   Vaping Use: Never used  Substance and Sexual Activity   Alcohol use: No   Drug use: No   Sexual activity: Never  Other Topics Concern   Not on file  Social History Narrative   Not on file   Social Determinants of Health   Financial Resource Strain: Not on file  Food Insecurity: Not on file  Transportation Needs: Not on file  Physical Activity: Not on file  Stress: Not on file  Social Connections: Not on file  Intimate Partner Violence: Not on file    Family History: Family History  Problem Relation Age of Onset   Heart attack  Father 33   Healthy Mother    Healthy Brother      Review of Systems: All other systems reviewed and are otherwise negative except as noted above.  Physical Exam: Vitals:   04/25/21 1108  BP: 108/68  Pulse: 75  SpO2: 96%  Weight: 276 lb (125.2 kg)  Height: 5' 11.5" (1.816 m)     GEN- The patient is well appearing, alert and oriented x 3 today.   HEENT: normocephalic, atraumatic; sclera clear, conjunctiva pink; hearing intact; oropharynx clear; neck supple  Lungs- Clear to ausculation bilaterally, normal work of breathing.  No wheezes, rales, rhonchi Heart- Regular rate and rhythm, no murmurs, rubs or gallops  GI- soft, non-tender, non-distended, bowel sounds present  Extremities- no clubbing or cyanosis. No edema MS- no significant deformity or atrophy Skin- warm and dry, no rash or lesion; PPM pocket well healed Psych- euthymic mood, full affect Neuro- strength and sensation are intact  PPM Interrogation- reviewed in detail today,  See PACEART report  EKG:  EKG is ordered today. The ekg ordered today shows V pacing at 75 bpm with underlying Atrial fibrillation. QRS 120-160  Recent Labs: No results found for requested labs within last 8760 hours.   Wt Readings from Last 3 Encounters:  04/25/21 276 lb (125.2 kg)  06/27/19 280 lb (127 kg)  12/09/17 (!) 307 lb (139.3 kg)     Other studies Reviewed: Additional studies/ records that were reviewed today include: Previous EP office notes, Previous remote checks, Most recent labwork.   Assessment and Plan:  1. Sick sinus syndrome s/p Medtronic PPM  Normal PPM function See Pace Art report No changes today  2. Permanent Atrial fibrillation He is on coumadin for CHA2DS2VASC of at least 5. Managed by PCP. Holding and restarting per PCP.    If patient does develop s/s of HF, would update echo first with frequent RV pacing.   2. Cardiac Clearance for Total knee arthroplasty Echo 10/2015 LVEF 50-55%. No s/s of HF that  indicate urgent need to repeat prior to this relatively low risk surgery.  Pt is at a low risk of perioperative complications from a cardiac perspective by Revised Cardiac Risk Index [place] Criteria) He verbalizes understanding of the risk of increased HRs in AF in the perioperative setting. He verbalizes understanding of the risk of stroke by holding [place].    Current medicines are reviewed at length with the patient today.   The patient does not have concerns regarding his medicines.  The following changes were made today:  none  Labs/  tests ordered today include:  Orders Placed This Encounter  Procedures   Basic metabolic panel   CBC   EKG 12-Lead    Disposition:   Follow up with [name] or EP APP in 12 Months    Signed, Shirley Friar, PA-C  04/25/2021 1:16 PM  [address] [place] [address] [phone] (office) [phone] (fax)

## 2021-04-26 LAB — CBC
Hematocrit: 38.4 % (ref 37.5–51.0)
Hemoglobin: 12.6 g/dL — ABNORMAL LOW (ref 13.0–17.7)
MCH: 30.4 pg (ref 26.6–33.0)
MCHC: 32.8 g/dL (ref 31.5–35.7)
MCV: 93 fL (ref 79–97)
Platelets: 202 10*3/uL (ref 150–450)
RBC: 4.15 x10E6/uL (ref 4.14–5.80)
RDW: 13.9 % (ref 11.6–15.4)
WBC: 6.5 10*3/uL (ref 3.4–10.8)

## 2021-04-26 LAB — BASIC METABOLIC PANEL
BUN/Creatinine Ratio: 20 (ref 10–24)
BUN: 31 mg/dL — ABNORMAL HIGH (ref 8–27)
CO2: 21 mmol/L (ref 20–29)
Calcium: 9.4 mg/dL (ref 8.6–10.2)
Chloride: 103 mmol/L (ref 96–106)
Creatinine, Ser: 1.58 mg/dL — ABNORMAL HIGH (ref 0.76–1.27)
Glucose: 103 mg/dL — ABNORMAL HIGH (ref 65–99)
Potassium: 4 mmol/L (ref 3.5–5.2)
Sodium: 140 mmol/L (ref 134–144)
eGFR: 44 mL/min/{1.73_m2} — ABNORMAL LOW (ref >59)

## 2021-05-08 ENCOUNTER — Encounter (HOSPITAL_COMMUNITY): Payer: Self-pay

## 2021-05-08 ENCOUNTER — Other Ambulatory Visit: Payer: Self-pay

## 2021-05-08 ENCOUNTER — Encounter (HOSPITAL_COMMUNITY)
Admission: RE | Admit: 2021-05-08 | Discharge: 2021-05-08 | Disposition: A | Discharge status: Home / Self Care | Payer: Medicare Other | Source: Ambulatory Visit | Attending: Orthopedic Surgery | Admitting: Orthopedic Surgery

## 2021-05-08 DIAGNOSIS — Z01812 Encounter for preprocedural laboratory examination: Secondary | ICD-10-CM | POA: Insufficient documentation

## 2021-05-08 HISTORY — DX: Type 2 diabetes mellitus without complications: E11.9

## 2021-05-08 HISTORY — DX: Personal history of urinary calculi: Z87.442

## 2021-05-08 HISTORY — DX: Dyspnea, unspecified: R06.00

## 2021-05-08 LAB — COMPREHENSIVE METABOLIC PANEL
ALT: 11 U/L (ref 0–44)
AST: 17 U/L (ref 15–41)
Albumin: 4 g/dL (ref 3.5–5.0)
Alkaline Phosphatase: 68 U/L (ref 38–126)
Anion gap: 6 (ref 5–15)
BUN: 36 mg/dL — ABNORMAL HIGH (ref 8–23)
CO2: 28 mmol/L (ref 22–32)
Calcium: 9.6 mg/dL (ref 8.9–10.3)
Chloride: 105 mmol/L (ref 98–111)
Creatinine, Ser: 1.72 mg/dL — ABNORMAL HIGH (ref 0.61–1.24)
GFR, Estimated: 40 mL/min — ABNORMAL LOW (ref >60)
Glucose, Bld: 113 mg/dL — ABNORMAL HIGH (ref 70–99)
Potassium: 4.1 mmol/L (ref 3.5–5.1)
Sodium: 139 mmol/L (ref 135–145)
Total Bilirubin: 1 mg/dL (ref 0.3–1.2)
Total Protein: 7.3 g/dL (ref 6.5–8.1)

## 2021-05-08 LAB — HEMOGLOBIN A1C
Hgb A1c MFr Bld: 5.8 % — ABNORMAL HIGH (ref 4.8–5.6)
Mean Plasma Glucose: 119.76 mg/dL

## 2021-05-08 LAB — GLUCOSE, CAPILLARY: Glucose-Capillary: 100 mg/dL — ABNORMAL HIGH (ref 70–99)

## 2021-05-08 LAB — SURGICAL PCR SCREEN
MRSA, PCR: NEGATIVE
Staphylococcus aureus: NEGATIVE

## 2021-05-08 NOTE — Patient Instructions (Addendum)
DUE TO COVID-19 ONLY ONE VISITOR IS ALLOWED TO COME WITH YOU AND STAY IN THE WAITING ROOM ONLY DURING PRE OP AND PROCEDURE.   **NO VISITORS ARE ALLOWED IN THE SHORT STAY AREA OR RECOVERY ROOM!!**  IF YOU WILL BE ADMITTED INTO THE HOSPITAL YOU ARE ALLOWED ONLY TWO SUPPORT PEOPLE DURING VISITATION HOURS ONLY (10AM -8PM)   The support person(s) may change daily. The support person(s) must pass our screening, gel in and out, and wear a mask at all times, including in the patient's room. Patients must also wear a mask when staff or their support person are in the room.  No visitors under the age of 72. Any visitor under the age of 14 must be accompanied by an adult.    COVID SWAB TESTING MUST BE COMPLETED ON: 05/10/21 @ 1:30 PM   [address] [place], [place] [address]   You are not required to quarantine, however you are required to wear a well-fitted mask when you are out and around people not in your household.  Hand Hygiene often Do NOT share personal items Notify your provider if you are in close contact with someone who has COVID or you develop fever 100.4 or greater, new onset of sneezing, cough, sore throat, shortness of breath or body aches.       Your procedure is scheduled on: 05/14/21   Report to [facility] Main  Entrance   Report to Short Stay at 5:15 AM   [facility])   Call this number if you have problems the morning of surgery [phone]   Do not eat food :After Midnight.   May have liquids until 4:15 AM day of surgery  CLEAR LIQUID DIET  Foods Allowed                                                                     Foods Excluded  Water, Black Coffee and tea, regular and decaf                liquids that you cannot  Plain Jell-O in any flavor  (No red)                                      see through such as: Fruit ices (not with fruit pulp)                                              milk, soups, orange juice              Iced Popsicles (No red)                                                   All solid food  Apple juices Sports drinks like Gatorade (No red) Lightly seasoned clear broth or consume(fat free) Sugar, honey syrup       The day of surgery:  Drink ONE (1) G2 by 4:15 am the morning of surgery. Drink in one sitting. Do not sip.  This drink was given to you during your hospital  pre-op appointment visit. Nothing else to drink after completing the   G2.          If you have questions, please contact your surgeon's office.     Oral Hygiene is also important to reduce your risk of infection.                                    Remember - BRUSH YOUR TEETH THE MORNING OF SURGERY WITH YOUR REGULAR TOOTHPASTE   Take these medicines the morning of surgery with A SIP OF WATER: Acetaminophen, Carvedilol.   DO NOT TAKE ANY ORAL DIABETIC MEDICATIONS DAY OF YOUR SURGERY  How to Manage Your Diabetes Before and After Surgery  Why is it important to control my blood sugar before and after surgery? Improving blood sugar levels before and after surgery helps healing and can limit problems. A way of improving blood sugar control is eating a healthy diet by:  Eating less sugar and carbohydrates  Increasing activity/exercise  Talking with your doctor about reaching your blood sugar goals High blood sugars (greater than 180 mg/dL) can raise your risk of infections and slow your recovery, so you will need to focus on controlling your diabetes during the weeks before surgery. Make sure that the doctor who takes care of your diabetes knows about your planned surgery including the date and location.  How do I manage my blood sugar before surgery? Check your blood sugar at least 4 times a day, starting 2 days before surgery, to make sure that the level is not too high or low. Check your blood sugar the morning of your surgery when you wake up and every 2 hours until you get to the Short Stay unit. If your  blood sugar is less than 70 mg/dL, you will need to treat for low blood sugar: Do not take insulin. Treat a low blood sugar (less than 70 mg/dL) with  cup of clear juice (cranberry or apple), 4 glucose tablets, OR glucose gel. Recheck blood sugar in 15 minutes after treatment (to make sure it is greater than 70 mg/dL). If your blood sugar is not greater than 70 mg/dL on recheck, call [phone] for further instructions. Report your blood sugar to the short stay nurse when you get to Short Stay.  If you are admitted to the hospital after surgery: Your blood sugar will be checked by the staff and you will probably be given insulin after surgery (instead of oral diabetes medicines) to make sure you have good blood sugar levels. The goal for blood sugar control after surgery is 80-180 mg/dL.   WHAT DO I DO ABOUT MY DIABETES MEDICATION?  Do not take oral diabetes medicines (pills) the morning of surgery.  THE DAY BEFORE SURGERY, take Januvia and Metformin as prescribed.       THE MORNING OF SURGERY, do not take Januvia or Metfomin.    Reviewed and Endorsed by [facility] Patient Education Committee, August 2015  You may not have any metal on your body including jewelry, and body piercing             Do not wear lotions, powders, cologne, or deodorant              Men may shave face and neck.   Do not bring valuables to the hospital. [place].   Contacts, dentures or bridgework may not be worn into surgery.   Bring small overnight bag day of surgery.  Special Instructions: Bring a copy of your healthcare power of attorney and living will documents         the day of surgery if you haven't scanned them in before.              Please read over the following fact sheets you were given: IF YOU HAVE QUESTIONS ABOUT YOUR PRE OP INSTRUCTIONS PLEASE CALL [phone]   [facility] - Preparing for  Surgery Before surgery, you can play an important role.  Because skin is not sterile, your skin needs to be as free of germs as possible.  You can reduce the number of germs on your skin by washing with CHG (chlorahexidine gluconate) soap before surgery.  CHG is an antiseptic cleaner which kills germs and bonds with the skin to continue killing germs even after washing. Please DO NOT use if you have an allergy to CHG or antibacterial soaps.  If your skin becomes reddened/irritated stop using the CHG and inform your nurse when you arrive at Short Stay. Do not shave (including legs and underarms) for at least 48 hours prior to the first CHG shower.  You may shave your face/neck.  Please follow these instructions carefully:  1.  Shower with CHG Soap the night before surgery and the  morning of surgery.  2.  If you choose to wash your hair, wash your hair first as usual with your normal  shampoo.  3.  After you shampoo, rinse your hair and body thoroughly to remove the shampoo.                             4.  Use CHG as you would any other liquid soap.  You can apply chg directly to the skin and wash.  Gently with a scrungie or clean washcloth.  5.  Apply the CHG Soap to your body ONLY FROM THE NECK DOWN.   Do   not use on face/ open                           Wound or open sores. Avoid contact with eyes, ears mouth and   genitals (private parts).                       Wash face,  Genitals (private parts) with your normal soap.             6.  Wash thoroughly, paying special attention to the area where your    surgery  will be performed.  7.  Thoroughly rinse your body with warm water from the neck down.  8.  DO NOT shower/wash with your normal soap after using and rinsing off the CHG Soap.  9.  Pat yourself dry with a clean towel.            10.  Wear clean pajamas.            11.  Place clean sheets on your bed the night of your first shower and do not  sleep with pets. Day of Surgery  : Do not apply any lotions/deodorants the morning of surgery.  Please wear clean clothes to the hospital/surgery center.  FAILURE TO FOLLOW THESE INSTRUCTIONS MAY RESULT IN THE CANCELLATION OF YOUR SURGERY  PATIENT SIGNATURE_________________________________  NURSE SIGNATURE__________________________________  ________________________________________________________________________   [name]  An incentive spirometer is a tool that can help keep your lungs clear and active. This tool measures how well you are filling your lungs with each breath. Taking long deep breaths may help reverse or decrease the chance of developing breathing (pulmonary) problems (especially infection) following: A long period of time when you are unable to move or be active. BEFORE THE PROCEDURE  If the spirometer includes an indicator to show your best effort, your nurse or respiratory therapist will set it to a desired goal. If possible, sit up straight or lean slightly forward. Try not to slouch. Hold the incentive spirometer in an upright position. INSTRUCTIONS FOR USE  Sit on the edge of your bed if possible, or sit up as far as you can in bed or on a chair. Hold the incentive spirometer in an upright position. Breathe out normally. Place the mouthpiece in your mouth and seal your lips tightly around it. Breathe in slowly and as deeply as possible, raising the piston or the ball toward the top of the column. Hold your breath for 3-5 seconds or for as long as possible. Allow the piston or ball to fall to the bottom of the column. Remove the mouthpiece from your mouth and breathe out normally. Rest for a few seconds and repeat Steps 1 through 7 at least 10 times every 1-2 hours when you are awake. Take your time and take a few normal breaths between deep breaths. The spirometer may include an indicator to show your best effort. Use the indicator as a goal to work toward during each repetition. After  each set of 10 deep breaths, practice coughing to be sure your lungs are clear. If you have an incision (the cut made at the time of surgery), support your incision when coughing by placing a pillow or rolled up towels firmly against it. Once you are able to get out of bed, walk around indoors and cough well. You may stop using the incentive spirometer when instructed by your caregiver.  RISKS AND COMPLICATIONS Take your time so you do not get dizzy or light-headed. If you are in pain, you may need to take or ask for pain medication before doing incentive spirometry. It is harder to take a deep breath if you are having pain. AFTER USE Rest and breathe slowly and easily. It can be helpful to keep track of a log of your progress. Your caregiver can provide you with a simple table to help with this. If you are using the spirometer at home, follow these instructions: [place] IF:  You are having difficultly using the spirometer. You have trouble using the spirometer as often as instructed. Your pain medication is not giving enough relief while using the spirometer. You develop fever of 100.5 F (38.1 C) or higher. SEEK IMMEDIATE MEDICAL CARE IF:  You cough up bloody sputum that  had not been present before. You develop fever of 102 F (38.9 C) or greater. You develop worsening pain at or near the incision site. MAKE SURE YOU:  Understand these instructions. Will watch your condition. Will get help right away if you are not doing well or get worse. Document Released: 03/16/2007 Document Revised: 01/26/2012 Document Reviewed: 05/17/2007 [facility] Patient Information 2014 [place], [place].   WHAT IS A BLOOD TRANSFUSION? Blood Transfusion Information  A transfusion is the replacement of blood or some of its parts. Blood is made up of multiple cells which provide different functions. Red blood cells carry oxygen and are used for blood loss replacement. White blood cells fight against  infection. Platelets control bleeding. Plasma helps clot blood. Other blood products are available for specialized needs, such as hemophilia or other clotting disorders. BEFORE THE TRANSFUSION  Who gives blood for transfusions?  Healthy volunteers who are fully evaluated to make sure their blood is safe. This is blood bank blood. Transfusion therapy is the safest it has ever been in the practice of medicine. Before blood is taken from a donor, a complete history is taken to make sure that person has no history of diseases nor engages in risky social behavior (examples are intravenous drug use or sexual activity with multiple partners). The donor's travel history is screened to minimize risk of transmitting infections, such as malaria. The donated blood is tested for signs of infectious diseases, such as HIV and hepatitis. The blood is then tested to be sure it is compatible with you in order to minimize the chance of a transfusion reaction. If you or a relative donates blood, this is often done in anticipation of surgery and is not appropriate for emergency situations. It takes many days to process the donated blood. RISKS AND COMPLICATIONS Although transfusion therapy is very safe and saves many lives, the main dangers of transfusion include:  Getting an infectious disease. Developing a transfusion reaction. This is an allergic reaction to something in the blood you were given. Every precaution is taken to prevent this. The decision to have a blood transfusion has been considered carefully by your caregiver before blood is given. Blood is not given unless the benefits outweigh the risks. AFTER THE TRANSFUSION Right after receiving a blood transfusion, you will usually feel much better and more energetic. This is especially true if your red blood cells have gotten low (anemic). The transfusion raises the level of the red blood cells which carry oxygen, and this usually causes an energy increase. The  nurse administering the transfusion will monitor you carefully for complications. HOME CARE INSTRUCTIONS  No special instructions are needed after a transfusion. You may find your energy is better. Speak with your caregiver about any limitations on activity for underlying diseases you may have. SEEK MEDICAL CARE IF:  Your condition is not improving after your transfusion. You develop redness or irritation at the intravenous (IV) site. SEEK IMMEDIATE MEDICAL CARE IF:  Any of the following symptoms occur over the next 12 hours: Shaking chills. You have a temperature by mouth above 102 F (38.9 C), not controlled by medicine. Chest, back, or muscle pain. People around you feel you are not acting correctly or are confused. Shortness of breath or difficulty breathing. Dizziness and fainting. You get a rash or develop hives. You have a decrease in urine output. Your urine turns a dark color or changes to pink, red, or brown. Any of the following symptoms occur over the next 10 days: You  have a temperature by mouth above 102 F (38.9 C), not controlled by medicine. Shortness of breath. Weakness after normal activity. The white part of the eye turns yellow (jaundice). You have a decrease in the amount of urine or are urinating less often. Your urine turns a dark color or changes to pink, red, or brown. Document Released: 10/31/2000 Document Revised: 01/26/2012 Document Reviewed: 06/19/2008 [facility] Patient Information 2014 [place], [place].  _______________________________________________________________________   ________________________________________________________________________

## 2021-05-08 NOTE — Progress Notes (Addendum)
COVID Vaccine Completed: x2 Date COVID Vaccine completed: Has received booster: COVID vaccine manufacturer:  Moderna    Date of COVID positive in last 90 days: N/a  PCP - Shon Baton, MD Electrophysiologist- Thompson Grayer, MD Cardiologist - N/A  Chest x-ray - 10/29/15 Care Everywhere EKG -  04/25/21 Epic Stress Test - pt reports long time ago ECHO - 11/15/15 Epic Cardiac Cath - N/A Pacemaker/ICD device last checked: 04/25/21 Epic Spinal Cord Stimulator:  Cardiac Clearance from [facility] Ellison in note dated 04/25/21- Epic  Sleep Study - Yes, pt reports negative  CPAP -   Fasting Blood Sugar - 100 Checks Blood Sugar _1_ times a day Free style libre glucose monitor  Blood Thinner Instructions: Warfarin managed by PCP, no instructions yet, educated pt to call PCP  Activity level: Pt is not able to go up stairs without SOB, pt reports he has had this all his life. He denies any CP going up stairs or performing ADLs.    Anesthesia review: pacemaker, HTN, A fib, complete heart block, LV dysfunction, CHF, SOB, DM   Patient denies shortness of breath, fever, cough and chest pain at PAT appointment   Patient verbalized understanding of instructions that were given to them at the PAT appointment. Patient was also instructed that they will need to review over the PAT instructions again at home before surgery.

## 2021-05-08 NOTE — Progress Notes (Signed)
   05/08/21 1332  OBSTRUCTIVE SLEEP APNEA  Have you ever been diagnosed with sleep apnea through a sleep study? No  Do you snore loudly (loud enough to be heard through closed doors)?  1  Do you often feel tired, fatigued, or sleepy during the daytime (such as falling asleep during driving or talking to someone)? 0  Has anyone observed you stop breathing during your sleep? 0  Do you have, or are you being treated for high blood pressure? 1  Age > 50 (1-yes) 1  Neck circumference greater than:Male 16 inches or larger, Male 17inches or larger? 1  Male Gender (Yes=1) 1  Obstructive Sleep Apnea Score 5  Score 5 or greater  Results sent to PCP

## 2021-05-09 ENCOUNTER — Encounter: Payer: Self-pay | Admitting: Internal Medicine

## 2021-05-09 NOTE — Progress Notes (Signed)
[facility] DEVICE PROGRAMMING  Patient Information: Name:  [name]  DOB:  1941-05-28  MRN:  [id number]    Blenda Peals, RN  P Cv Div Heartcare Device Planned Procedure:  Right Total Knee Arthroplasty  Surgeon; Paralee Cancel  Date of Procedure:  05-14-2021  Cautery will be used. Yes  Position during surgery:     Please send documentation back to:  [facility] (Fax # [phone])  Device Information:  Clinic EP Physician:  Thompson Grayer, MD   Device Type:  Pacemaker Manufacturer and Phone #:  Medtronic: [phone] Pacemaker Dependent?:  Yes.   Date of Last Device Check:  04/25/21 Normal Device Function?:  Yes.    Electrophysiologist's Recommendations:  Have magnet available. Provide continuous ECG monitoring when magnet is used or reprogramming is to be performed.  Procedure may interfere with device function.  Magnet should be placed over device during procedure.  Per Device Clinic Standing Orders, York Ram, RN  3:34 PM 05/09/2021

## 2021-05-09 NOTE — Progress Notes (Signed)
Lab. Results: Cr: 1.72

## 2021-05-10 ENCOUNTER — Other Ambulatory Visit (HOSPITAL_COMMUNITY)
Admission: RE | Admit: 2021-05-10 | Discharge: 2021-05-10 | Disposition: A | Discharge status: Home / Self Care | Payer: Medicare Other | Source: Ambulatory Visit | Attending: Orthopedic Surgery | Admitting: Orthopedic Surgery

## 2021-05-10 DIAGNOSIS — Z01812 Encounter for preprocedural laboratory examination: Secondary | ICD-10-CM | POA: Diagnosis present

## 2021-05-10 DIAGNOSIS — Z20822 Contact with and (suspected) exposure to covid-19: Secondary | ICD-10-CM | POA: Insufficient documentation

## 2021-05-10 LAB — SARS CORONAVIRUS 2 (TAT 6-24 HRS): SARS Coronavirus 2: NEGATIVE

## 2021-05-10 NOTE — Progress Notes (Signed)
Anesthesia Chart Review   Case: [id number] Date/Time: 05/14/21 0700   Procedure: TOTAL KNEE ARTHROPLASTY (Right: Knee)   Anesthesia type: Spinal   Pre-op diagnosis: Right knee osteoarthritis   Location: [facility] ROOM 09 / [facility]   Surgeons: Paralee Cancel, MD       DISCUSSION:79 y.o. 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 Paralee Cancel.   Pt last seen by cardiology 04/25/2021. Per OV note, "Echo 10/2015 LVEF 50-55%. No s/s of HF that indicate urgent need to repeat prior to this relatively low risk surgery. Pt is at a low risk of perioperative complications from a cardiac perspective by Revised Cardiac Risk Index [place] Criteria) He verbalizes understanding of the risk of increased HRs in AF in the perioperative setting. He verbalizes understanding of the risk of stroke by holding Effort."  Pt reports no instructions on when to hold Coumadin at PAT visit.  Nurse advised pt to contact PCP.  Dr. Alvan Dame made aware.  VS: BP 109/71   Pulse 87   Temp 36.5 C (Oral)   Resp 16   Ht 6' (1.829 m)   SpO2 96%   BMI 37.43 kg/m   PROVIDERS: Shon Baton, MD is PCP   Thompson Grayer, MD is Cardiologist  LABS: Labs reviewed: Acceptable for surgery. (all labs ordered are listed, but only abnormal results are displayed)  Labs Reviewed  HEMOGLOBIN A1C - Abnormal; Notable for the following components:      Result Value   Hgb A1c MFr Bld 5.8 (*)    All other components within normal limits  COMPREHENSIVE METABOLIC PANEL - Abnormal; Notable for the following components:   Glucose, Bld 113 (*)    BUN 36 (*)    Creatinine, Ser 1.72 (*)    GFR, Estimated 40 (*)    All other components within normal limits  GLUCOSE, CAPILLARY - Abnormal; Notable for the following components:   Glucose-Capillary 100 (*)    All other components within  normal limits  SURGICAL PCR SCREEN  TYPE AND SCREEN     IMAGES:   EKG: 04/25/2021 Rate 75 bpm   CV: Echo 11/15/2015 - Left ventricle: The cavity size was normal. Wall thickness was    normal. Systolic function was normal. The estimated ejection    fraction was in the range of 50% to 55%.  - Left atrium: The atrium was mildly dilated.  - Atrial septum: No defect or patent foramen ovale was identified.  Past Medical History:  Diagnosis Date   Arthritis    Chronic anticoagulation    Depression    Diabetes mellitus without complication ([place])    Dyspnea    Gout    History of echocardiogram    Echo 9/16:  EF 55-60%, no RWMA, mild MR, mild LAE, trivial PI   History of kidney stones    HTN (hypertension)    LV dysfunction    EF down to 30% per echo Feb 2013 -  started on ARB and low dose beta blocker   Morbid obesity ([place])    Nephrolithiasis    Osteoarthritis    Pacemaker 11/17/2008   Permanent atrial fibrillation ([place])    failed cardioversion in April of 2010   Prostate CA [facility])    Urosepsis     Past Surgical History:  Procedure Laterality Date   CARDIOVERSION  02/15/2009  failed   EP IMPLANTABLE DEVICE N/A 08/17/2015   Procedure: PPM Generator Changeout;  Surgeon: Thompson Grayer, MD;  Location: [place] CV LAB;  Service: Cardiovascular;  Laterality: N/A;   PACEMAKER PLACEMENT  11/17/2008   Medtronic   RADIOACTIVE SEED IMPLANT     SHOULDER SURGERY     TOTAL KNEE ARTHROPLASTY     WISDOM TOOTH EXTRACTION      MEDICATIONS:  acetaminophen (TYLENOL) 650 MG CR tablet   allopurinol (ZYLOPRIM) 300 MG tablet   carvedilol (COREG) 3.125 MG tablet   celecoxib (CELEBREX) 100 MG capsule   furosemide (LASIX) 20 MG tablet   hydrochlorothiazide (HYDRODIURIL) 25 MG tablet   JANUVIA 100 MG tablet   losartan (COZAAR) 50 MG tablet   metFORMIN (GLUCOPHAGE) 500 MG tablet   pravastatin (PRAVACHOL) 20 MG tablet   triamcinolone cream (KENALOG) 0.1 %   warfarin (COUMADIN) 5 MG  tablet   No current facility-administered medications for this encounter.    Konrad Felix, PA-C [facility] Pre-Surgical Testing [phone]

## 2021-05-13 NOTE — H&P (Signed)
TOTAL KNEE ADMISSION H&P  Patient is being admitted for right total knee arthroplasty.  Subjective:  Chief Complaint:right knee pain.  HPI: [name], 80 y.o. male, has a history of pain and functional disability in the right knee due to arthritis and has failed non-surgical conservative treatments for greater than 12 weeks to includeactivity modification.  Onset of symptoms was gradual, starting 2 years ago with gradually worsening course since that time. The patient noted no past surgery on the right knee(s).  Patient currently rates pain in the right knee(s) at 7 out of 10 with activity. Patient has worsening of pain with activity and weight bearing and pain that interferes with activities of daily living.  Patient has evidence of joint space narrowing by imaging studies. There is no active infection.  Patient Active Problem List   Diagnosis Date Noted   Chronic systolic heart failure ([place]) 04/20/2013   Biceps rupture, distal 04/01/2012   LV dysfunction 02/27/2012   Teeth problem 02/27/2012   Pacemaker-Medtronic 02/10/2011   MORBID OBESITY 12/13/2010   ESSENTIAL HYPERTENSION, BENIGN 12/13/2010   Permanent atrial fibrillation ([place]) 12/13/2010   Complete heart block ([place]) 12/13/2010   Past Medical History:  Diagnosis Date   Arthritis    Chronic anticoagulation    Depression    Diabetes mellitus without complication ([place])    Dyspnea    Gout    History of echocardiogram    Echo 9/16:  EF 55-60%, no RWMA, mild MR, mild LAE, trivial PI   History of kidney stones    HTN (hypertension)    LV dysfunction    EF down to 30% per echo Feb 2013 -  started on ARB and low dose beta blocker   Morbid obesity ([place])    Nephrolithiasis    Osteoarthritis    Pacemaker 11/17/2008   Permanent atrial fibrillation ([place])    failed cardioversion in April of 2010   Prostate CA [facility])    Urosepsis     Past Surgical History:  Procedure Laterality Date   CARDIOVERSION  02/15/2009   failed   EP  IMPLANTABLE DEVICE N/A 08/17/2015   Procedure: PPM Generator Changeout;  Surgeon: Thompson Grayer, MD;  Location: [facility];  Service: Cardiovascular;  Laterality: N/A;   PACEMAKER PLACEMENT  11/17/2008   Medtronic   RADIOACTIVE SEED IMPLANT     SHOULDER SURGERY     TOTAL KNEE ARTHROPLASTY     WISDOM TOOTH EXTRACTION      No current facility-administered medications for this encounter.   Current Outpatient Medications  Medication Sig Dispense Refill Last Dose   acetaminophen (TYLENOL) 650 MG CR tablet Take 650 mg by mouth every 8 (eight) hours as needed for pain.      allopurinol (ZYLOPRIM) 300 MG tablet TAKE ONE TABLET BY MOUTH ONCE DAILY (Patient taking differently: Take 300 mg by mouth at bedtime.) 30 tablet 0    carvedilol (COREG) 3.125 MG tablet Take 1 tablet (3.125 mg total) by mouth 2 (two) times daily. Please schedule overdue appt for future refills. 2nd attempt. (Patient taking differently: Take 6.25 mg by mouth at bedtime. Please schedule overdue appt for future refills. 2nd attempt.) 30 tablet 0    celecoxib (CELEBREX) 100 MG capsule Take 100 mg by mouth daily.      furosemide (LASIX) 20 MG tablet Take 20 mg by mouth daily as needed for edema.      hydrochlorothiazide (HYDRODIURIL) 25 MG tablet Take 1 tablet by mouth once daily (Patient taking differently:  Take 25 mg by mouth at bedtime.) 30 tablet 0    JANUVIA 100 MG tablet Take 100 mg by mouth daily.      losartan (COZAAR) 50 MG tablet TAKE 1 TABLET BY MOUTH ONCE DAILY.  PLEASE MAKE APPT WITH DR. ALLRED BEFORE ANYMORE REFILLS. (Patient taking differently: Take 50 mg by mouth at bedtime.) 15 tablet 1    metFORMIN (GLUCOPHAGE) 500 MG tablet Take 500 mg by mouth at bedtime.      pravastatin (PRAVACHOL) 20 MG tablet Take 20 mg by mouth at bedtime.       triamcinolone cream (KENALOG) 0.1 % Apply 1 application topically as needed (rash).      warfarin (COUMADIN) 5 MG tablet Take as directed by coumadin clinic (Patient taking  differently: Take 7.5 mg by mouth at bedtime.) 150 tablet 1    Allergies  Allergen Reactions   Oxycodone Other (See Comments)    Makes feel funny   Sertraline Hcl Other (See Comments)    Makes feel funny    Social History   Tobacco Use   Smoking status: Never   Smokeless tobacco: Never  Substance Use Topics   Alcohol use: No    Family History  Problem Relation Age of Onset   Heart attack Father 88   Healthy Mother    Healthy Brother      Review of Systems  Constitutional:  Negative for chills and fever.  Respiratory:  Negative for cough and shortness of breath.   Cardiovascular:  Negative for chest pain.  Gastrointestinal:  Negative for nausea and vomiting.  Musculoskeletal:  Positive for arthralgias.   Objective:  Physical Exam Overweight and well developed. General: Alert and oriented x3, cooperative and pleasant, no acute distress. Head: normocephalic, atraumatic, neck supple. Eyes: EOMI.  Musculoskeletal: Right knee exam: No palpable effusion, warmth erythema Flexion contracture about 5 degrees with flexion to 110 degrees with tightness and pain Tenderness predominantly over the medial and anterior aspect of the joints with crepitation noted. Stable medial lateral collateral ligaments No significant lower extremity edema identified and no lower extremity erythema.  Calves soft and nontender. Motor function intact in LE. Strength 5/5 LE bilaterally. Neuro: Distal pulses 2+. Sensation to light touch intact in LE.  Vital signs in last 24 hours:    Labs:   Estimated body mass index is 37.43 kg/m as calculated from the following:   Height as of 05/08/21: 6' (1.829 m).   Weight as of 04/25/21: 125.2 kg.   Imaging Review Plain radiographs demonstrate severe degenerative joint disease of the right knee(s). The overall alignment isneutral. The bone quality appears to be adequate for age and reported activity level.      Assessment/Plan:  End stage  arthritis, right knee   The patient history, physical examination, clinical judgment of the provider and imaging studies are consistent with end stage degenerative joint disease of the right knee(s) and total knee arthroplasty is deemed medically necessary. The treatment options including medical management, injection therapy arthroscopy and arthroplasty were discussed at length. The risks and benefits of total knee arthroplasty were presented and reviewed. The risks due to aseptic loosening, infection, stiffness, patella tracking problems, thromboembolic complications and other imponderables were discussed. The patient acknowledged the explanation, agreed to proceed with the plan and consent was signed. Patient is being admitted for inpatient treatment for surgery, pain control, PT, OT, prophylactic antibiotics, VTE prophylaxis, progressive ambulation and ADL's and discharge planning. The patient is planning to be discharged  home.  Therapy Plans: outpatient therapy at [facility] Disposition: Home with friend [name] Planned DVT Prophylaxis: Coumadin DME needed: walker, 3-n-1 PCP: Dr. Virgina Jock, **refax form** Cardiologist: [name], clearance received TXA: IV Allergies: Anesthesia Concerns: none BMI: 40.2 Last HgbA1c: ~5%  Other: - Hx of atrial fibrillation on Coumadin - managed by PCP Dr. Virgina Jock    Patient's anticipated LOS is less than 2 midnights, meeting these requirements: - Younger than 40 - Lives within 1 hour of care - Has a competent adult at home to recover with post-op recover - NO history of  - Chronic pain requiring opiods  - Diabetes  - Coronary Artery Disease  - Heart failure  - Heart attack  - Stroke  - DVT/VTE  - Cardiac arrhythmia  - Respiratory Failure/COPD  - Renal failure  - Anemia  - Advanced Liver disease  Griffith Citron, PA-C Orthopedic Surgery [facility] [place] [phone]

## 2021-05-13 NOTE — Anesthesia Preprocedure Evaluation (Addendum)
Anesthesia Evaluation  Patient identified by MRN, date of birth, ID band Patient awake    Reviewed: Allergy & Precautions, NPO status , Patient's Chart, lab work & pertinent test results  Airway Mallampati: II  TM Distance: >3 FB Neck ROM: Full    Dental no notable dental hx.    Pulmonary neg pulmonary ROS,    Pulmonary exam normal breath sounds clear to auscultation       Cardiovascular hypertension, Pt. on medications Normal cardiovascular exam+ dysrhythmias Atrial Fibrillation + pacemaker  Rhythm:Regular Rate:Normal     Neuro/Psych negative neurological ROS  negative psych ROS   GI/Hepatic negative GI ROS, Neg liver ROS,   Endo/Other  diabetes, Type 2  Renal/GU Renal InsufficiencyRenal disease  negative genitourinary   Musculoskeletal negative musculoskeletal ROS (+)   Abdominal   Peds negative pediatric ROS (+)  Hematology negative hematology ROS (+)   Anesthesia Other Findings   Reproductive/Obstetrics negative OB ROS                            Anesthesia Physical Anesthesia Plan  ASA: 3  Anesthesia Plan: Spinal   Post-op Pain Management:  Regional for Post-op pain   Induction: Intravenous  PONV Risk Score and Plan: 2 and Ondansetron, Dexamethasone and Propofol infusion  Airway Management Planned: Simple Face Mask  Additional Equipment:   Intra-op Plan:   Post-operative Plan:   Informed Consent: I have reviewed the patients History and Physical, chart, labs and discussed the procedure including the risks, benefits and alternatives for the proposed anesthesia with the patient or authorized representative who has indicated his/her understanding and acceptance.     Dental advisory given  Plan Discussed with: CRNA and Surgeon  Anesthesia Plan Comments:         Anesthesia Quick Evaluation

## 2021-05-14 ENCOUNTER — Ambulatory Visit (HOSPITAL_COMMUNITY): Payer: Medicare Other | Admitting: Physician Assistant

## 2021-05-14 ENCOUNTER — Encounter (HOSPITAL_COMMUNITY)
Admission: RE | Disposition: A | Discharge status: Home / Self Care | Payer: Self-pay | Source: Home / Self Care | Attending: Orthopedic Surgery

## 2021-05-14 ENCOUNTER — Other Ambulatory Visit: Payer: Self-pay

## 2021-05-14 ENCOUNTER — Observation Stay (HOSPITAL_COMMUNITY)
Admission: RE | Admit: 2021-05-14 | Discharge: 2021-05-15 | Disposition: A | Discharge status: Home / Self Care | Payer: Medicare Other | Attending: Orthopedic Surgery | Admitting: Orthopedic Surgery

## 2021-05-14 ENCOUNTER — Ambulatory Visit (HOSPITAL_COMMUNITY): Payer: Medicare Other | Admitting: Certified Registered Nurse Anesthetist

## 2021-05-14 ENCOUNTER — Encounter (HOSPITAL_COMMUNITY): Payer: Self-pay | Admitting: Orthopedic Surgery

## 2021-05-14 DIAGNOSIS — I5022 Chronic systolic (congestive) heart failure: Secondary | ICD-10-CM | POA: Diagnosis not present

## 2021-05-14 DIAGNOSIS — I4821 Permanent atrial fibrillation: Secondary | ICD-10-CM | POA: Diagnosis not present

## 2021-05-14 DIAGNOSIS — E119 Type 2 diabetes mellitus without complications: Secondary | ICD-10-CM | POA: Diagnosis not present

## 2021-05-14 DIAGNOSIS — Z950 Presence of cardiac pacemaker: Secondary | ICD-10-CM | POA: Diagnosis not present

## 2021-05-14 DIAGNOSIS — M1711 Unilateral primary osteoarthritis, right knee: Principal | ICD-10-CM | POA: Insufficient documentation

## 2021-05-14 DIAGNOSIS — Z7901 Long term (current) use of anticoagulants: Secondary | ICD-10-CM | POA: Insufficient documentation

## 2021-05-14 DIAGNOSIS — Z79899 Other long term (current) drug therapy: Secondary | ICD-10-CM | POA: Insufficient documentation

## 2021-05-14 DIAGNOSIS — I110 Hypertensive heart disease with heart failure: Secondary | ICD-10-CM | POA: Insufficient documentation

## 2021-05-14 DIAGNOSIS — Z8546 Personal history of malignant neoplasm of prostate: Secondary | ICD-10-CM | POA: Insufficient documentation

## 2021-05-14 DIAGNOSIS — Z96651 Presence of right artificial knee joint: Secondary | ICD-10-CM

## 2021-05-14 DIAGNOSIS — Z7984 Long term (current) use of oral hypoglycemic drugs: Secondary | ICD-10-CM | POA: Diagnosis not present

## 2021-05-14 DIAGNOSIS — Z96659 Presence of unspecified artificial knee joint: Secondary | ICD-10-CM | POA: Insufficient documentation

## 2021-05-14 HISTORY — PX: TOTAL KNEE ARTHROPLASTY: SHX125

## 2021-05-14 LAB — GLUCOSE, CAPILLARY
Glucose-Capillary: 102 mg/dL — ABNORMAL HIGH (ref 70–99)
Glucose-Capillary: 117 mg/dL — ABNORMAL HIGH (ref 70–99)
Glucose-Capillary: 162 mg/dL — ABNORMAL HIGH (ref 70–99)
Glucose-Capillary: 171 mg/dL — ABNORMAL HIGH (ref 70–99)
Glucose-Capillary: 168 mg/dL — ABNORMAL HIGH (ref 70–99)

## 2021-05-14 LAB — ABO/RH: ABO/RH(D): B NEG

## 2021-05-14 LAB — TYPE AND SCREEN
ABO/RH(D): B NEG
Antibody Screen: NEGATIVE

## 2021-05-14 LAB — APTT: aPTT: 27 seconds (ref 24–36)

## 2021-05-14 LAB — PROTIME-INR
INR: 1.2 (ref 0.8–1.2)
Prothrombin Time: 14.9 seconds (ref 11.4–15.2)

## 2021-05-14 SURGERY — ARTHROPLASTY, KNEE, TOTAL
Anesthesia: Spinal | Site: Knee | Laterality: Right

## 2021-05-14 MED ORDER — LOSARTAN POTASSIUM 50 MG PO TABS: 50.0000 mg | ORAL_TABLET | Freq: Every day | ORAL | Status: DC

## 2021-05-14 MED ORDER — HYDROCODONE-ACETAMINOPHEN 5-325 MG PO TABS
1.0000 | ORAL_TABLET | ORAL | Status: DC | PRN
  Administered 2021-05-14 (×2): 1 via ORAL
  Administered 2021-05-15: 2 via ORAL
  Filled 2021-05-14 (×2): qty 1
  Filled 2021-05-14: qty 2

## 2021-05-14 MED ORDER — ACETAMINOPHEN 10 MG/ML IV SOLN: 1000.0000 mg | Freq: Once | INTRAVENOUS | Status: DC | PRN

## 2021-05-14 MED ORDER — BUPIVACAINE IN DEXTROSE 0.75-8.25 % IT SOLN
INTRATHECAL | Status: DC | PRN
  Administered 2021-05-14: 1.6 mL via INTRATHECAL

## 2021-05-14 MED ORDER — FUROSEMIDE 20 MG PO TABS: 20.0000 mg | ORAL_TABLET | Freq: Every day | ORAL | Status: DC | PRN

## 2021-05-14 MED ORDER — CARVEDILOL 6.25 MG PO TABS
6.2500 mg | ORAL_TABLET | Freq: Every day | ORAL | Status: DC
  Administered 2021-05-14: 6.25 mg via ORAL
  Filled 2021-05-14: qty 1

## 2021-05-14 MED ORDER — MORPHINE SULFATE (PF) 2 MG/ML IV SOLN: 0.5000 mg | INTRAVENOUS | Status: DC | PRN

## 2021-05-14 MED ORDER — HYDROCODONE-ACETAMINOPHEN 7.5-325 MG PO TABS: 1.0000 | ORAL_TABLET | ORAL | Status: DC | PRN

## 2021-05-14 MED ORDER — HYDROMORPHONE HCL 1 MG/ML IJ SOLN
0.2500 mg | INTRAMUSCULAR | Status: DC | PRN
Start: 2021-05-14 — End: 2021-05-14

## 2021-05-14 MED ORDER — ONDANSETRON HCL 4 MG/2ML IJ SOLN: 4.0000 mg | Freq: Four times a day (QID) | INTRAMUSCULAR | Status: DC | PRN

## 2021-05-14 MED ORDER — CELECOXIB 200 MG PO CAPS
200.0000 mg | ORAL_CAPSULE | Freq: Two times a day (BID) | ORAL | Status: DC
  Administered 2021-05-14 – 2021-05-15 (×2): 200 mg via ORAL
  Filled 2021-05-14 (×2): qty 1

## 2021-05-14 MED ORDER — POLYETHYLENE GLYCOL 3350 17 G PO PACK: 17.0000 g | PACK | Freq: Every day | ORAL | Status: DC | PRN

## 2021-05-14 MED ORDER — STERILE WATER FOR IRRIGATION IR SOLN
Status: DC | PRN
  Administered 2021-05-14: 2000 mL

## 2021-05-14 MED ORDER — CEFAZOLIN IN SODIUM CHLORIDE 3-0.9 GM/100ML-% IV SOLN
3.0000 g | INTRAVENOUS | Status: AC
  Administered 2021-05-14: 3 g via INTRAVENOUS
  Filled 2021-05-14: qty 100

## 2021-05-14 MED ORDER — PHENYLEPHRINE 40 MCG/ML (10ML) SYRINGE FOR IV PUSH (FOR BLOOD PRESSURE SUPPORT)
PREFILLED_SYRINGE | INTRAVENOUS | Status: DC | PRN
  Administered 2021-05-14 (×5): 80 ug via INTRAVENOUS

## 2021-05-14 MED ORDER — DEXAMETHASONE SODIUM PHOSPHATE 10 MG/ML IJ SOLN
8.0000 mg | Freq: Once | INTRAMUSCULAR | Status: AC
Start: 2021-05-14 — End: 2021-05-14
  Administered 2021-05-14: 8 mg via INTRAVENOUS

## 2021-05-14 MED ORDER — PHENYLEPHRINE HCL-NACL 10-0.9 MG/250ML-% IV SOLN
INTRAVENOUS | Status: DC | PRN
  Administered 2021-05-14: 25 ug/min via INTRAVENOUS

## 2021-05-14 MED ORDER — METOCLOPRAMIDE HCL 5 MG/ML IJ SOLN: 5.0000 mg | Freq: Three times a day (TID) | INTRAMUSCULAR | Status: DC | PRN

## 2021-05-14 MED ORDER — METFORMIN HCL 500 MG PO TABS
500.0000 mg | ORAL_TABLET | Freq: Every day | ORAL | Status: DC
  Administered 2021-05-14: 500 mg via ORAL
  Filled 2021-05-14: qty 1

## 2021-05-14 MED ORDER — MIDAZOLAM HCL 2 MG/2ML IJ SOLN
INTRAMUSCULAR | Status: AC
  Filled 2021-05-14: qty 2

## 2021-05-14 MED ORDER — ORAL CARE MOUTH RINSE: 15.0000 mL | Freq: Once | OROMUCOSAL | Status: AC

## 2021-05-14 MED ORDER — SODIUM CHLORIDE 0.9 % IV SOLN: INTRAVENOUS | Status: DC

## 2021-05-14 MED ORDER — CEFAZOLIN SODIUM-DEXTROSE 2-4 GM/100ML-% IV SOLN
2.0000 g | Freq: Four times a day (QID) | INTRAVENOUS | Status: AC
Start: 2021-05-14 — End: 2021-05-15
  Administered 2021-05-14 (×2): 2 g via INTRAVENOUS
  Filled 2021-05-14 (×2): qty 100

## 2021-05-14 MED ORDER — POVIDONE-IODINE 10 % EX SWAB
2.0000 "application " | Freq: Once | CUTANEOUS | Status: AC
  Administered 2021-05-14: 2 via TOPICAL

## 2021-05-14 MED ORDER — KETOROLAC TROMETHAMINE 30 MG/ML IJ SOLN
INTRAMUSCULAR | Status: AC
  Filled 2021-05-14: qty 1

## 2021-05-14 MED ORDER — ALLOPURINOL 300 MG PO TABS
300.0000 mg | ORAL_TABLET | Freq: Every day | ORAL | Status: DC
  Administered 2021-05-14: 300 mg via ORAL
  Filled 2021-05-14: qty 1

## 2021-05-14 MED ORDER — PHENOL 1.4 % MT LIQD: 1.0000 | OROMUCOSAL | Status: DC | PRN

## 2021-05-14 MED ORDER — DOCUSATE SODIUM 100 MG PO CAPS
100.0000 mg | ORAL_CAPSULE | Freq: Two times a day (BID) | ORAL | Status: DC
  Administered 2021-05-14 – 2021-05-15 (×2): 100 mg via ORAL
  Filled 2021-05-14 (×2): qty 1

## 2021-05-14 MED ORDER — BUPIVACAINE HCL (PF) 0.5 % IJ SOLN
INTRAMUSCULAR | Status: DC | PRN
  Administered 2021-05-14: 20 mL via PERINEURAL

## 2021-05-14 MED ORDER — PROPOFOL 10 MG/ML IV BOLUS
INTRAVENOUS | Status: AC
  Filled 2021-05-14: qty 40

## 2021-05-14 MED ORDER — PHENYLEPHRINE HCL-NACL 10-0.9 MG/250ML-% IV SOLN
INTRAVENOUS | Status: AC
  Filled 2021-05-14: qty 250

## 2021-05-14 MED ORDER — CHLORHEXIDINE GLUCONATE 0.12 % MT SOLN
15.0000 mL | Freq: Once | OROMUCOSAL | Status: AC
  Administered 2021-05-14: 15 mL via OROMUCOSAL

## 2021-05-14 MED ORDER — BISACODYL 10 MG RE SUPP: 10.0000 mg | Freq: Every day | RECTAL | Status: DC | PRN

## 2021-05-14 MED ORDER — ONDANSETRON HCL 4 MG/2ML IJ SOLN
4.0000 mg | Freq: Once | INTRAMUSCULAR | Status: DC | PRN
Start: 2021-05-14 — End: 2021-05-14

## 2021-05-14 MED ORDER — PRAVASTATIN SODIUM 20 MG PO TABS
20.0000 mg | ORAL_TABLET | Freq: Every day | ORAL | Status: DC
  Administered 2021-05-14: 20 mg via ORAL
  Filled 2021-05-14: qty 1

## 2021-05-14 MED ORDER — LACTATED RINGERS IV SOLN: INTRAVENOUS | Status: DC

## 2021-05-14 MED ORDER — FENTANYL CITRATE (PF) 100 MCG/2ML IJ SOLN
INTRAMUSCULAR | Status: AC
  Filled 2021-05-14: qty 2

## 2021-05-14 MED ORDER — ONDANSETRON HCL 4 MG/2ML IJ SOLN
INTRAMUSCULAR | Status: DC | PRN
  Administered 2021-05-14: 4 mg via INTRAVENOUS

## 2021-05-14 MED ORDER — KETOROLAC TROMETHAMINE 30 MG/ML IJ SOLN
INTRAMUSCULAR | Status: DC | PRN
Start: 2021-05-14 — End: 2021-05-14
  Administered 2021-05-14: 30 mg via INTRA_ARTICULAR

## 2021-05-14 MED ORDER — METOCLOPRAMIDE HCL 5 MG PO TABS: 5.0000 mg | ORAL_TABLET | Freq: Three times a day (TID) | ORAL | Status: DC | PRN

## 2021-05-14 MED ORDER — WARFARIN SODIUM 5 MG PO TABS
10.0000 mg | ORAL_TABLET | Freq: Once | ORAL | Status: AC
  Administered 2021-05-14: 10 mg via ORAL
  Filled 2021-05-14: qty 2

## 2021-05-14 MED ORDER — FERROUS SULFATE 325 (65 FE) MG PO TABS
325.0000 mg | ORAL_TABLET | Freq: Three times a day (TID) | ORAL | Status: DC
  Administered 2021-05-15 (×2): 325 mg via ORAL
  Filled 2021-05-14 (×2): qty 1

## 2021-05-14 MED ORDER — FENTANYL CITRATE (PF) 100 MCG/2ML IJ SOLN
INTRAMUSCULAR | Status: DC | PRN
  Administered 2021-05-14: 50 ug via INTRAVENOUS

## 2021-05-14 MED ORDER — BUPIVACAINE-EPINEPHRINE (PF) 0.25% -1:200000 IJ SOLN
INTRAMUSCULAR | Status: DC | PRN
  Administered 2021-05-14: 30 mL

## 2021-05-14 MED ORDER — ACETAMINOPHEN 325 MG PO TABS
325.0000 mg | ORAL_TABLET | Freq: Four times a day (QID) | ORAL | Status: DC | PRN
Start: 2021-05-15 — End: 2021-05-15

## 2021-05-14 MED ORDER — BUPIVACAINE-EPINEPHRINE (PF) 0.25% -1:200000 IJ SOLN
INTRAMUSCULAR | Status: AC
  Filled 2021-05-14: qty 30

## 2021-05-14 MED ORDER — ONDANSETRON HCL 4 MG PO TABS: 4.0000 mg | ORAL_TABLET | Freq: Four times a day (QID) | ORAL | Status: DC | PRN

## 2021-05-14 MED ORDER — METHOCARBAMOL 500 MG IVPB - SIMPLE MED
INTRAVENOUS | Status: AC
  Filled 2021-05-14: qty 50

## 2021-05-14 MED ORDER — LINAGLIPTIN 5 MG PO TABS
5.0000 mg | ORAL_TABLET | Freq: Every day | ORAL | Status: DC
  Administered 2021-05-14 – 2021-05-15 (×2): 5 mg via ORAL
  Filled 2021-05-14 (×2): qty 1

## 2021-05-14 MED ORDER — SODIUM CHLORIDE (PF) 0.9 % IJ SOLN
INTRAMUSCULAR | Status: DC | PRN
  Administered 2021-05-14: 30 mL

## 2021-05-14 MED ORDER — DEXAMETHASONE SODIUM PHOSPHATE 10 MG/ML IJ SOLN
10.0000 mg | Freq: Once | INTRAMUSCULAR | Status: DC
  Filled 2021-05-14: qty 1

## 2021-05-14 MED ORDER — DEXAMETHASONE SODIUM PHOSPHATE 10 MG/ML IJ SOLN
INTRAMUSCULAR | Status: AC
  Filled 2021-05-14: qty 1

## 2021-05-14 MED ORDER — INSULIN ASPART 100 UNIT/ML IJ SOLN
0.0000 [IU] | Freq: Three times a day (TID) | INTRAMUSCULAR | Status: DC
Start: 2021-05-14 — End: 2021-05-15
  Administered 2021-05-14: 3 [IU] via SUBCUTANEOUS
  Administered 2021-05-15: 2 [IU] via SUBCUTANEOUS

## 2021-05-14 MED ORDER — ONDANSETRON HCL 4 MG/2ML IJ SOLN
INTRAMUSCULAR | Status: AC
  Filled 2021-05-14: qty 2

## 2021-05-14 MED ORDER — MENTHOL 3 MG MT LOZG: 1.0000 | LOZENGE | OROMUCOSAL | Status: DC | PRN

## 2021-05-14 MED ORDER — TRANEXAMIC ACID-NACL 1000-0.7 MG/100ML-% IV SOLN
1000.0000 mg | Freq: Once | INTRAVENOUS | Status: AC
  Administered 2021-05-14: 1000 mg via INTRAVENOUS
  Filled 2021-05-14: qty 100

## 2021-05-14 MED ORDER — 0.9 % SODIUM CHLORIDE (POUR BTL) OPTIME
TOPICAL | Status: DC | PRN
  Administered 2021-05-14: 1000 mL

## 2021-05-14 MED ORDER — TRANEXAMIC ACID-NACL 1000-0.7 MG/100ML-% IV SOLN
1000.0000 mg | INTRAVENOUS | Status: AC
  Administered 2021-05-14: 1000 mg via INTRAVENOUS
  Filled 2021-05-14: qty 100

## 2021-05-14 MED ORDER — INSULIN ASPART 100 UNIT/ML IJ SOLN: 0.0000 [IU] | Freq: Every day | INTRAMUSCULAR | Status: DC

## 2021-05-14 MED ORDER — METHOCARBAMOL 500 MG PO TABS
500.0000 mg | ORAL_TABLET | Freq: Four times a day (QID) | ORAL | Status: DC | PRN
  Administered 2021-05-14: 500 mg via ORAL
  Filled 2021-05-14: qty 1

## 2021-05-14 MED ORDER — PROPOFOL 500 MG/50ML IV EMUL
INTRAVENOUS | Status: DC | PRN
  Administered 2021-05-14: 50 ug/kg/min via INTRAVENOUS

## 2021-05-14 MED ORDER — WARFARIN - PHARMACIST DOSING INPATIENT: Freq: Every day | Status: DC

## 2021-05-14 MED ORDER — HYDROCHLOROTHIAZIDE 25 MG PO TABS: 25.0000 mg | ORAL_TABLET | Freq: Every day | ORAL | Status: DC

## 2021-05-14 MED ORDER — DIPHENHYDRAMINE HCL 12.5 MG/5ML PO ELIX: 12.5000 mg | ORAL_SOLUTION | ORAL | Status: DC | PRN

## 2021-05-14 MED ORDER — METHOCARBAMOL 500 MG IVPB - SIMPLE MED
500.0000 mg | Freq: Four times a day (QID) | INTRAVENOUS | Status: DC | PRN
  Administered 2021-05-14: 500 mg via INTRAVENOUS
  Filled 2021-05-14: qty 50

## 2021-05-14 SURGICAL SUPPLY — 60 items
ATTUNE MED ANAT PAT 41 KNEE (Knees) ×2 IMPLANT
ATTUNE MED ANAT PAT 41MM KNEE (Knees) ×1 IMPLANT
ATTUNE PS FEM RT SZ 8 CEM KNEE (Femur) ×3 IMPLANT
ATTUNE PSRP INSR SZ8 8 KNEE (Insert) ×2 IMPLANT
ATTUNE PSRP INSR SZ8 8MM KNEE (Insert) ×1 IMPLANT
BAG COUNTER SPONGE SURGICOUNT (BAG) ×6 IMPLANT
BAG SURGICOUNT SPONGE COUNTING (BAG) ×3
BAG ZIPLOCK 12X15 (MISCELLANEOUS) IMPLANT
BASE TIBIAL ATTUNE KNEE SZ9 (Knees) ×1 IMPLANT
BLADE SAW SGTL 11.0X1.19X90.0M (BLADE) IMPLANT
BLADE SAW SGTL 13.0X1.19X90.0M (BLADE) ×3 IMPLANT
BLADE SURG SZ10 CARB STEEL (BLADE) ×6 IMPLANT
BNDG ELASTIC 6X10 VLCR STRL LF (GAUZE/BANDAGES/DRESSINGS) ×3 IMPLANT
BNDG ELASTIC 6X5.8 VLCR STR LF (GAUZE/BANDAGES/DRESSINGS) ×3 IMPLANT
BOWL SMART MIX CTS (DISPOSABLE) ×3 IMPLANT
CEMENT HV SMART SET (Cement) ×6 IMPLANT
CUFF TOURN SGL QUICK 34 (TOURNIQUET CUFF) ×3
CUFF TRNQT CYL 34X4.125X (TOURNIQUET CUFF) ×1 IMPLANT
DECANTER SPIKE VIAL GLASS SM (MISCELLANEOUS) ×6 IMPLANT
DERMABOND ADVANCED (GAUZE/BANDAGES/DRESSINGS) ×2
DERMABOND ADVANCED .7 DNX12 (GAUZE/BANDAGES/DRESSINGS) ×1 IMPLANT
DRAPE U-SHAPE 47X51 STRL (DRAPES) ×3 IMPLANT
DRSG ADAPTIC 3X8 NADH LF (GAUZE/BANDAGES/DRESSINGS) ×3 IMPLANT
DRSG PAD ABDOMINAL 8X10 ST (GAUZE/BANDAGES/DRESSINGS) ×3 IMPLANT
DURAPREP 26ML APPLICATOR (WOUND CARE) ×6 IMPLANT
ELECT REM PT RETURN 15FT ADLT (MISCELLANEOUS) ×3 IMPLANT
GAUZE SPONGE 4X4 12PLY STRL (GAUZE/BANDAGES/DRESSINGS) ×3 IMPLANT
GAUZE XEROFORM 5X9 LF (GAUZE/BANDAGES/DRESSINGS) ×3 IMPLANT
GLOVE SURG ENC MOIS LTX SZ6 (GLOVE) IMPLANT
GLOVE SURG UNDER LTX SZ7.5 (GLOVE) ×3 IMPLANT
GLOVE SURG UNDER POLY LF SZ6.5 (GLOVE) IMPLANT
GLOVE SURG UNDER POLY LF SZ7.5 (GLOVE) ×3 IMPLANT
GOWN STRL REUS W/TWL LRG LVL3 (GOWN DISPOSABLE) ×3 IMPLANT
HANDPIECE INTERPULSE COAX TIP (DISPOSABLE) ×3
HOLDER FOLEY CATH W/STRAP (MISCELLANEOUS) IMPLANT
KIT TURNOVER KIT A (KITS) ×3 IMPLANT
MANIFOLD NEPTUNE II (INSTRUMENTS) ×3 IMPLANT
NDL SAFETY ECLIPSE 18X1.5 (NEEDLE) IMPLANT
NEEDLE HYPO 18GX1.5 SHARP (NEEDLE)
NS IRRIG 1000ML POUR BTL (IV SOLUTION) ×3 IMPLANT
PACK TOTAL KNEE CUSTOM (KITS) ×3 IMPLANT
PADDING CAST SYNTHETIC 4 (CAST SUPPLIES) ×2
PADDING CAST SYNTHETIC 4X4 STR (CAST SUPPLIES) ×1 IMPLANT
PENCIL SMOKE EVACUATOR (MISCELLANEOUS) IMPLANT
PIN DRILL FIX HALF THREAD (BIT) ×3 IMPLANT
PIN FIX SIGMA LCS THRD HI (PIN) ×3 IMPLANT
PROTECTOR NERVE ULNAR (MISCELLANEOUS) ×3 IMPLANT
SET HNDPC FAN SPRY TIP SCT (DISPOSABLE) ×1 IMPLANT
SET PAD KNEE POSITIONER (MISCELLANEOUS) ×3 IMPLANT
SUT MNCRL AB 4-0 PS2 18 (SUTURE) ×3 IMPLANT
SUT STRATAFIX PDS+ 0 24IN (SUTURE) ×3 IMPLANT
SUT VIC AB 1 CT1 36 (SUTURE) ×3 IMPLANT
SUT VIC AB 2-0 CT1 27 (SUTURE) ×9
SUT VIC AB 2-0 CT1 TAPERPNT 27 (SUTURE) ×3 IMPLANT
SYR 3ML LL SCALE MARK (SYRINGE) ×3 IMPLANT
TIBIAL BASE ATTUNE KNEE SZ9 (Knees) ×3 IMPLANT
TRAY FOLEY MTR SLVR 16FR STAT (SET/KITS/TRAYS/PACK) ×3 IMPLANT
TUBE SUCTION HIGH CAP CLEAR NV (SUCTIONS) ×3 IMPLANT
WATER STERILE IRR 1000ML POUR (IV SOLUTION) ×6 IMPLANT
WRAP KNEE MAXI GEL POST OP (GAUZE/BANDAGES/DRESSINGS) ×3 IMPLANT

## 2021-05-14 NOTE — Anesthesia Procedure Notes (Signed)
Anesthesia Procedure Image    

## 2021-05-14 NOTE — Interval H&P Note (Signed)
History and Physical Interval Note:  05/14/2021 7:17 AM  [name]  has presented today for surgery, with the diagnosis of Right knee osteoarthritis.  The various methods of treatment have been discussed with the patient and family. After consideration of risks, benefits and other options for treatment, the patient has consented to  Procedure(s): TOTAL KNEE ARTHROPLASTY (Right) as a surgical intervention.  The patient's history has been reviewed, patient examined, no change in status, stable for surgery.  I have reviewed the patient's chart and labs.  Questions were answered to the patient's satisfaction.     Mauri Pole

## 2021-05-14 NOTE — Evaluation (Signed)
Physical Therapy Evaluation Patient Details Name: [name] MRN: [id number] DOB: [birth year] Today's Date: 05/14/2021   History of Present Illness  80 y.o. male admitted 05/14/21 for R TKA. PMH of pacemaker, prostate cancer, afib, gout, biceps rupture, obesity.  Clinical Impression  Pt is s/p TKA resulting in the deficits listed below (see PT Problem List).  Pt ambulated 30' with RW, TKA HEP was initiated. Good progress expected. Pt will benefit from skilled PT to increase their independence and safety with mobility to allow discharge to the venue listed below.       Follow Up Recommendations Follow surgeon's recommendation for DC plan and follow-up therapies    Equipment Recommendations  Rolling walker with 5" wheels;3in1 (PT)    Recommendations for Other Services       Precautions / Restrictions Precautions Precautions: Knee Precaution Comments: reviewed no pillow under knee; pt is HOH Restrictions Weight Bearing Restrictions: No      Mobility  Bed Mobility Overal bed mobility: Modified Independent             General bed mobility comments: used rail, HOB up    Transfers Overall transfer level: Needs assistance Equipment used: Rolling walker (2 wheeled) Transfers: Sit to/from Stand Sit to Stand: Min assist         General transfer comment: VCs hand placement, min A to power up  Ambulation/Gait Ambulation/Gait assistance: Min guard Gait Distance (Feet): 50 Feet Assistive device: Rolling walker (2 wheeled) Gait Pattern/deviations: Step-to pattern Gait velocity: decr   General Gait Details: steady, no loss of balance, VCs hand placement  Stairs            Wheelchair Mobility    Modified Rankin (Stroke Patients Only)       Balance Overall balance assessment: Modified Independent                                           Pertinent Vitals/Pain Pain Assessment: 0-10 Pain Score: 6  Pain Location: R knee & thigh Pain  Descriptors / Indicators: Sore Pain Intervention(s): Limited activity within patient's tolerance;Monitored during session;Premedicated before session;Ice applied    Home Living Family/patient expects to be discharged to:: Private residence Living Arrangements: Alone Available Help at Discharge: Friend(s);Available 24 hours/day Type of Home: House Home Access: Stairs to enter Entrance Stairs-Rails: None Entrance Stairs-Number of Steps: 2 Home Layout: One level Home Equipment: Cane - single point      Prior Function Level of Independence: Independent with assistive device(s)         Comments: walked with [facility]; girlfriend will provide 24* hour assist, pt plans to stay with her (above info is for her home)     Hand Dominance        Extremity/Trunk Assessment   Upper Extremity Assessment Upper Extremity Assessment: Overall WFL for tasks assessed    Lower Extremity Assessment Lower Extremity Assessment: RLE deficits/detail RLE Deficits / Details: 5-65* AAROM R knee, 3/5 SLR and knee ext RLE Sensation: WNL RLE Coordination: WNL    Cervical / Trunk Assessment Cervical / Trunk Assessment: Normal  Communication   Communication: HOH  Cognition Arousal/Alertness: Awake/alert Behavior During Therapy: WFL for tasks assessed/performed Overall Cognitive Status: Within Functional Limits for tasks assessed  General Comments      Exercises Total Joint Exercises Ankle Circles/Pumps: AROM;Both;10 reps;Supine Quad Sets: AROM;Right;5 reps;Supine Heel Slides: AAROM;Right;5 reps;Supine Long Arc Quad: AROM;Right;5 reps;Seated Goniometric ROM: 5-65* AAROM R knee   Assessment/Plan    PT Assessment Patient needs continued PT services  PT Problem List Decreased strength;Decreased range of motion;Decreased activity tolerance;Decreased mobility;Pain       PT Treatment Interventions Gait training;Therapeutic activities;Stair  training;Therapeutic exercise;Patient/family education;Functional mobility training    PT Goals (Current goals can be found in the Care Plan section)  Acute Rehab PT Goals Patient Stated Goal: to walk PT Goal Formulation: With patient Time For Goal Achievement: 05/21/21 Potential to Achieve Goals: Good    Frequency     Barriers to discharge        Co-evaluation               AM-PAC PT "6 Clicks" Mobility  Outcome Measure Help needed turning from your back to your side while in a flat bed without using bedrails?: A Little Help needed moving from lying on your back to sitting on the side of a flat bed without using bedrails?: A Little Help needed moving to and from a bed to a chair (including a wheelchair)?: A Little Help needed standing up from a chair using your arms (e.g., wheelchair or bedside chair)?: A Little Help needed to walk in hospital room?: A Little Help needed climbing 3-5 steps with a railing? : A Lot 6 Click Score: 17    End of Session Equipment Utilized During Treatment: Gait belt Activity Tolerance: Patient tolerated treatment well Patient left: in chair;with call bell/phone within reach;with chair alarm set;with family/visitor present Nurse Communication: Mobility status PT Visit Diagnosis: Difficulty in walking, not elsewhere classified (R26.2);Pain Pain - Right/Left: Right Pain - part of body: Knee    Time: 1449-1511 PT Time Calculation (min) (ACUTE ONLY): 22 min   Charges:   PT Evaluation $PT Eval Moderate Complexity: 1 Mod          Philomena Doheny PT 05/14/2021  Acute Rehabilitation Services Pager [phone] Office [phone]

## 2021-05-14 NOTE — Discharge Instructions (Signed)

## 2021-05-14 NOTE — Anesthesia Procedure Notes (Signed)
Anesthesia Regional Block: Adductor canal block   Pre-Anesthetic Checklist: , timeout performed,  Correct Patient, Correct Site, Correct Laterality,  Correct Procedure, Correct Position, site marked,  Risks and benefits discussed,  Surgical consent,  Pre-op evaluation,  At surgeon's request and post-op pain management  Laterality: Right  Prep: chloraprep       Needles:  Injection technique: Single-shot  Needle Type: Echogenic Needle     Needle Length: 9cm      Additional Needles:   Procedures:,,,, ultrasound used (permanent image in chart),,    Narrative:  Start time: 05/14/2021 6:46 AM End time: 05/14/2021 6:51 AM Injection made incrementally with aspirations every 5 mL.  Performed by: Personally  Anesthesiologist: Myrtie Soman, MD  Additional Notes: Patient tolerated the procedure well without complications

## 2021-05-14 NOTE — Anesthesia Procedure Notes (Signed)
Procedure Name: MAC Date/Time: 05/14/2021 7:27 AM Performed by: West Pugh, CRNA Pre-anesthesia Checklist: Patient identified, Emergency Drugs available, Suction available, Patient being monitored and Timeout performed Patient Re-evaluated:Patient Re-evaluated prior to induction Oxygen Delivery Method: Simple face mask Preoxygenation: Pre-oxygenation with 100% oxygen Induction Type: IV induction Placement Confirmation: positive ETCO2 Dental Injury: Teeth and Oropharynx as per pre-operative assessment

## 2021-05-14 NOTE — Anesthesia Postprocedure Evaluation (Signed)
Anesthesia Post Note  Patient: [name]  Procedure(s) Performed: TOTAL KNEE ARTHROPLASTY (Right: Knee)     Patient location during evaluation: PACU Anesthesia Type: Spinal Level of consciousness: oriented and awake and alert Pain management: pain level controlled Vital Signs Assessment: post-procedure vital signs reviewed and stable Respiratory status: spontaneous breathing, respiratory function stable and patient connected to nasal cannula oxygen Cardiovascular status: blood pressure returned to baseline and stable Postop Assessment: no headache, no backache and no apparent nausea or vomiting Anesthetic complications: no   No notable events documented.  Last Vitals:  Vitals:   05/14/21 1046 05/14/21 1359  BP: 118/69 104/66  Pulse: 71 69  Resp: 18 14  Temp: 36.7 C 36.6 C  SpO2: 99% 99%    Last Pain:  Vitals:   05/14/21 1359  TempSrc: Oral  PainSc:                  Stepahnie Campo S

## 2021-05-14 NOTE — Op Note (Signed)
NAME:  [name]                      MEDICAL RECORD NO.:  [id number]                             FACILITY:  [facility]      PHYSICIAN:  Pietro Cassis. Alvan Dame, M.D.  DATE OF BIRTH:  02-04-1941      DATE OF PROCEDURE:  05/14/2021                                     OPERATIVE REPORT         PREOPERATIVE DIAGNOSIS:  Right knee osteoarthritis.      POSTOPERATIVE DIAGNOSIS:  Right knee osteoarthritis.      FINDINGS:  The patient was noted to have complete loss of cartilage and   bone-on-bone arthritis with associated osteophytes in the medial and patellofemoral compartments of   the knee with a significant synovitis and associated effusion.  The patient had failed months of conservative treatment including medications, injection therapy, activity modification.     PROCEDURE:  Right total knee replacement.      COMPONENTS USED:  DePuy Attune rotating platform posterior stabilized knee   system, a size 8 femur, 9 tibia, size 8 mm PS AOX insert, and 41 anatomic patellar   button.      SURGEON:  Pietro Cassis. Alvan Dame, M.D.      ASSISTANT:  Costella Hatcher, PA-C.      ANESTHESIA:  Regional and Spinal.      SPECIMENS:  None.      COMPLICATION:  None.      DRAINS:  None.  EBL: <100 vv      TOURNIQUET TIME:   Total Tourniquet Time Documented: Thigh (Right) - 40 minutes Total: Thigh (Right) - 40 minutes  .      The patient was stable to the recovery room.      INDICATION FOR PROCEDURE:  [name] is a 80 y.o. male patient of   mine.  The patient had been seen, evaluated, and treated for months conservatively in the   office with medication, activity modification, and injections.  The patient had   radiographic changes of bone-on-bone arthritis with endplate sclerosis and osteophytes noted.  Based on the radiographic changes and failed conservative measures, the patient   decided to proceed with definitive treatment, total knee replacement.  Risks of infection, DVT, component failure, need  for revision surgery, neurovascular injury were reviewed in the office setting.  The postop course was reviewed stressing the efforts to maximize post-operative satisfaction and function.  Consent was obtained for benefit of pain   relief.      PROCEDURE IN DETAIL:  The patient was brought to the operative theater.   Once adequate anesthesia, preoperative antibiotics, 3 gm of Ancef,1 gm of Tranexamic Acid, and 10 mg of Decadron administered, the patient was positioned supine with a right thigh tourniquet placed.  The  right lower extremity was prepped and draped in sterile fashion.  A time-   out was performed identifying the patient, planned procedure, and the appropriate extremity.      The right lower extremity was placed in the [facility] leg holder.  The leg was   exsanguinated, tourniquet elevated to 250 mmHg.  A midline incision was  made followed by median parapatellar arthrotomy.  Following initial   exposure, attention was first directed to the patella.  Precut   measurement was noted to be 27 mm.  I resected down to 15 mm and used a   41 anatomic patellar button to restore patellar height as well as cover the cut surface.      The lug holes were drilled and a metal shim was placed to protect the   patella from retractors and saw blade during the procedure.      At this point, attention was now directed to the femur.  The femoral   canal was opened with a drill, irrigated to try to prevent fat emboli.  An   intramedullary rod was passed at 5 degrees valgus, 11 mm of bone was   resected off the distal femur due to preoperative flexion contracture.  Following this resection, the tibia was   subluxated anteriorly.  Using the extramedullary guide, 2 mm of bone was resected off   the proximal medial tibia.  We confirmed the gap would be   stable medially and laterally with a size 6 spacer block as well as confirmed that the tibial cut was perpendicular in the coronal plane, checking with an  alignment rod.      Once this was done, I sized the femur to be a size 8 in the anterior-   posterior dimension, chose a standard component based on medial and   lateral dimension.  The size 8 rotation block was then pinned in   position anterior referenced using the C-clamp to set rotation.  The   anterior, posterior, and  chamfer cuts were made without difficulty nor   notching making certain that I was along the anterior cortex to help   with flexion gap stability.      The final box cut was made off the lateral aspect of distal femur.  Laminar spreader was used to open the posterior aspect of the knee and posterior osteophytes removed.     At this point, the tibia was sized to be a size 9.  The size 9 tray was   then pinned in position through the medial third of the tubercle,   drilled, and keel punched.  Trial reduction was now carried with a 8 femur,  9 tibia, a size 8 mm PS insert, and the 41 anatomic patella botton.  The knee was brought to full extension with good flexion stability with the patella   tracking through the trochlea without application of pressure.  Given   all these findings the trial components removed.  Final components were   opened and cement was mixed.  The knee was irrigated with normal saline solution and pulse lavage.  The synovial lining was   then injected with 30 cc of 0.25% Marcaine with epinephrine, 1 cc of Toradol and 30 cc of NS for a total of 61 cc.     Final implants were then cemented onto cleaned and dried cut surfaces of bone with the knee brought to extension with a size 8 mm PS trial insert.      Once the cement had fully cured, excess cement was removed   throughout the knee.  I confirmed that I was satisfied with the range of   motion and stability, and the final size 8 mm PS AOX insert was chosen.  It was   placed into the knee.      The tourniquet had been let down  at 40 minutes.  No significant   hemostasis was required.  The extensor  mechanism was then reapproximated using #1 Vicryl and #1 Stratafix sutures with the knee   in flexion.  The   remaining wound was closed with 2-0 Vicryl and running 4-0 Monocryl.   The knee was cleaned, dried, dressed sterilely using Dermabond and   Aquacel dressing.  The patient was then   brought to recovery room in stable condition, tolerating the procedure   well.   Please note that Physician Assistant, Costella Hatcher, PA-C was present for the entirety of the case, and was utilized for pre-operative positioning, peri-operative retractor management, general facilitation of the procedure and for primary wound closure at the end of the case.              Pietro Cassis Alvan Dame, M.D.    05/14/2021 8:58 AM

## 2021-05-14 NOTE — Plan of Care (Signed)
?  Problem: Education: ?Goal: Knowledge of General Education information will improve ?Description: Including pain rating scale, medication(s)/side effects and non-pharmacologic comfort measures ?Outcome: Progressing ?  ?Problem: Activity: ?Goal: Risk for activity intolerance will decrease ?Outcome: Progressing ?  ?Problem: Nutrition: ?Goal: Adequate nutrition will be maintained ?Outcome: Progressing ?  ?Problem: Elimination: ?Goal: Will not experience complications related to bowel motility ?Outcome: Progressing ?  ?Problem: Pain Managment: ?Goal: General experience of comfort will improve ?Outcome: Progressing ?  ?Problem: Education: ?Goal: Knowledge of the prescribed therapeutic regimen will improve ?Outcome: Progressing ?  ?Problem: Activity: ?Goal: Ability to avoid complications of mobility impairment will improve ?Outcome: Progressing ?  ?Problem: Pain Management: ?Goal: Pain level will decrease with appropriate interventions ?Outcome: Progressing ?  ?

## 2021-05-14 NOTE — Progress Notes (Signed)
Assumed care of pt from previous nurse.  Agree with previous nurses assessment. Will continue to monitor pt.

## 2021-05-14 NOTE — Progress Notes (Signed)
ANTICOAGULATION CONSULT NOTE - Initial Consult  Pharmacy Consult for warfarin Indication: atrial fibrillation and VTE prophylaxis  Allergies  Allergen Reactions   Oxycodone Other (See Comments)    Makes feel funny   Sertraline Hcl Other (See Comments)    Makes feel funny    Patient Measurements: Height: 6' (182.9 cm) Weight: 125.2 kg (276 lb) IBW/kg (Calculated) : 77.6  Vital Signs: Temp: 98 F (36.7 C) (06/28 1046) Temp Source: Oral (06/28 1046) BP: 118/69 (06/28 1046) Pulse Rate: 71 (06/28 1046)  Labs: Recent Labs    05/14/21 0600  APTT 27  LABPROT 14.9  INR 1.2    Estimated Creatinine Clearance: 48.4 mL/min (A) (by C-G formula based on SCr of 1.72 mg/dL (H)).   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)    failed cardioversion in April of 2010   Prostate [facility])    Urosepsis     Medications:  Medications Prior to Admission  Medication Sig Dispense Refill Last Dose   acetaminophen (TYLENOL) 650 MG CR tablet Take 650 mg by mouth every 8 (eight) hours as needed for pain.   Past Week   allopurinol (ZYLOPRIM) 300 MG tablet TAKE ONE TABLET BY MOUTH ONCE DAILY (Patient taking differently: Take 300 mg by mouth at bedtime.) 30 tablet 0 Past Week   carvedilol (COREG) 3.125 MG tablet Take 1 tablet (3.125 mg total) by mouth 2 (two) times daily. Please schedule overdue appt for future refills. 2nd attempt. (Patient taking differently: Take 6.25 mg by mouth at bedtime.  Please schedule overdue appt for future refills. 2nd attempt.) 30 tablet 0 05/13/2021   celecoxib (CELEBREX) 100 MG capsule Take 100 mg by mouth daily.   Past Week   furosemide (LASIX) 20 MG tablet Take 20 mg by mouth daily as needed for edema.   Past Month   hydrochlorothiazide (HYDRODIURIL) 25 MG tablet Take 1 tablet by mouth once daily (Patient taking differently: Take 25 mg by mouth at bedtime.) 30 tablet 0 05/13/2021   JANUVIA 100 MG tablet Take 100 mg by mouth daily.   05/13/2021   losartan (COZAAR) 50 MG tablet TAKE 1 TABLET BY MOUTH ONCE DAILY.  PLEASE MAKE APPT WITH DR. ALLRED BEFORE ANYMORE REFILLS. (Patient taking differently: Take 50 mg by mouth at bedtime.) 15 tablet 1 05/13/2021   metFORMIN (GLUCOPHAGE) 500 MG tablet Take 500 mg by mouth at bedtime.   05/13/2021   pravastatin (PRAVACHOL) 20 MG tablet Take 20 mg by mouth at bedtime.    05/13/2021   triamcinolone cream (KENALOG) 0.1 % Apply 1 application topically as needed (rash).   Past Week   warfarin (COUMADIN) 5 MG tablet Take as directed by coumadin clinic (Patient taking differently: Take 7.5 mg by mouth at bedtime.) 150 tablet 1 05/08/2021 at 1800   Scheduled:   allopurinol  300 mg Oral QHS  carvedilol  6.25 mg Oral QHS   celecoxib  200 mg Oral BID   [START ON 05/15/2021] dexamethasone (DECADRON) injection  10 mg Intravenous Once   docusate sodium  100 mg Oral BID   [START ON 05/15/2021] ferrous sulfate  325 mg Oral TID PC   hydrochlorothiazide  25 mg Oral QHS   linagliptin  5 mg Oral Daily   [START ON 05/15/2021] losartan  50 mg Oral QHS   metFORMIN  500 mg Oral Q supper   pravastatin  20 mg Oral QHS   Infusions:   sodium chloride      ceFAZolin (ANCEF) IV     methocarbamol (ROBAXIN) IV 500 mg (05/14/21 0954)   methocarbamol     tranexamic acid     PRN: [START ON 05/15/2021] acetaminophen, bisacodyl, diphenhydrAMINE, [START ON 05/15/2021] furosemide, HYDROcodone-acetaminophen, HYDROcodone-acetaminophen,  menthol-cetylpyridinium **OR** phenol, methocarbamol **OR** methocarbamol (ROBAXIN) IV, metoCLOPramide **OR** metoCLOPramide (REGLAN) injection, morphine injection, ondansetron **OR** ondansetron (ZOFRAN) IV, polyethylene glycol  Assessment: 80 yo male s/p R TKA now to restart his PTA warfarin on POD1 per Md orders. Patient reportedly taking warfarin 7.5mg  qhs before admission. INR this AM 1.2 as expected.   Goal of Therapy:  INR 2-3 Monitor platelets by anticoagulation protocol: Yes   Plan:  Warfarin 10mg  today at 1600 x 1 Daily INR ? Start Lovenox while INR subtherapeutic  Kara Mead 05/14/2021,10:58 AM

## 2021-05-14 NOTE — Anesthesia Procedure Notes (Signed)
Spinal  Patient location during procedure: OR Start time: 05/14/2021 7:27 AM End time: 05/14/2021 7:31 AM Reason for block: surgical anesthesia Staffing Performed: anesthesiologist  Anesthesiologist: Myrtie Soman, MD Preanesthetic Checklist Completed: patient identified, IV checked, site marked, risks and benefits discussed, surgical consent, monitors and equipment checked, pre-op evaluation and timeout performed Spinal Block Patient position: sitting Prep: Betadine Patient monitoring: heart rate, continuous pulse ox and blood pressure Approach: midline Location: L3-4 Injection technique: single-shot Needle Needle type: Sprotte  Needle gauge: 24 G Needle length: 9 cm Assessment Sensory level: T6 Events: CSF return Additional Notes Expiration date of kit checked and confirmed. Patient tolerated procedure well, without complications.

## 2021-05-14 NOTE — Transfer of Care (Signed)
Immediate Anesthesia Transfer of Care Note  Patient: [name]  Procedure(s) Performed: TOTAL KNEE ARTHROPLASTY (Right: Knee)  Patient Location: PACU  Anesthesia Type:Spinal and MAC combined with regional for post-op pain  Level of Consciousness: awake, alert  and patient cooperative  Airway & Oxygen Therapy: Patient Spontanous Breathing and Patient connected to face mask oxygen  Post-op Assessment: Report given to RN and Post -op Vital signs reviewed and stable  Post vital signs: Reviewed and stable  Last Vitals:  Vitals Value Taken Time  BP 129/81 05/14/21 0924  Temp    Pulse 71 05/14/21 0927  Resp 18 05/14/21 0927  SpO2 100 % 05/14/21 0927  Vitals shown include unvalidated device data.  Last Pain:  Vitals:   05/14/21 0612  TempSrc:   PainSc: 0-No pain      Patients Stated Pain Goal: 4 (35/68/61 6837)  Complications: No notable events documented.

## 2021-05-15 ENCOUNTER — Encounter (HOSPITAL_COMMUNITY): Payer: Self-pay | Admitting: Orthopedic Surgery

## 2021-05-15 DIAGNOSIS — M1711 Unilateral primary osteoarthritis, right knee: Secondary | ICD-10-CM | POA: Diagnosis not present

## 2021-05-15 LAB — BASIC METABOLIC PANEL
Anion gap: 9 (ref 5–15)
BUN: 44 mg/dL — ABNORMAL HIGH (ref 8–23)
CO2: 22 mmol/L (ref 22–32)
Calcium: 8.3 mg/dL — ABNORMAL LOW (ref 8.9–10.3)
Chloride: 105 mmol/L (ref 98–111)
Creatinine, Ser: 1.61 mg/dL — ABNORMAL HIGH (ref 0.61–1.24)
GFR, Estimated: 44 mL/min — ABNORMAL LOW (ref >60)
Glucose, Bld: 150 mg/dL — ABNORMAL HIGH (ref 70–99)
Potassium: 3.6 mmol/L (ref 3.5–5.1)
Sodium: 136 mmol/L (ref 135–145)

## 2021-05-15 LAB — CBC
HCT: 29 % — ABNORMAL LOW (ref 39.0–52.0)
Hemoglobin: 9.6 g/dL — ABNORMAL LOW (ref 13.0–17.0)
MCH: 31.4 pg (ref 26.0–34.0)
MCHC: 33.1 g/dL (ref 30.0–36.0)
MCV: 94.8 fL (ref 80.0–100.0)
Platelets: 150 10*3/uL (ref 150–400)
RBC: 3.06 MIL/uL — ABNORMAL LOW (ref 4.22–5.81)
RDW: 14.7 % (ref 11.5–15.5)
WBC: 11.3 10*3/uL — ABNORMAL HIGH (ref 4.0–10.5)
nRBC: 0 % (ref 0.0–0.2)

## 2021-05-15 LAB — PROTIME-INR
INR: 1.3 — ABNORMAL HIGH (ref 0.8–1.2)
Prothrombin Time: 15.8 seconds — ABNORMAL HIGH (ref 11.4–15.2)

## 2021-05-15 LAB — GLUCOSE, CAPILLARY
Glucose-Capillary: 138 mg/dL — ABNORMAL HIGH (ref 70–99)
Glucose-Capillary: 105 mg/dL — ABNORMAL HIGH (ref 70–99)

## 2021-05-15 MED ORDER — WARFARIN SODIUM 5 MG PO TABS: 7.5000 mg | ORAL_TABLET | Freq: Once | ORAL | Status: DC

## 2021-05-15 MED ORDER — HYDROCODONE-ACETAMINOPHEN 5-325 MG PO TABS: 1.0000 | ORAL_TABLET | ORAL | 0 refills | Status: DC | PRN

## 2021-05-15 MED ORDER — CEPHALEXIN 500 MG PO CAPS: 500.0000 mg | ORAL_CAPSULE | Freq: Four times a day (QID) | ORAL | 0 refills | Status: AC

## 2021-05-15 MED ORDER — METHOCARBAMOL 500 MG PO TABS: 500.0000 mg | ORAL_TABLET | Freq: Four times a day (QID) | ORAL | 0 refills | Status: DC | PRN

## 2021-05-15 MED ORDER — DOCUSATE SODIUM 100 MG PO CAPS
100.0000 mg | ORAL_CAPSULE | Freq: Two times a day (BID) | ORAL | 0 refills | Status: DC
Start: 2021-05-15 — End: 2021-10-02

## 2021-05-15 MED ORDER — POLYETHYLENE GLYCOL 3350 17 G PO PACK: 17.0000 g | PACK | Freq: Every day | ORAL | 0 refills | Status: DC | PRN

## 2021-05-15 NOTE — TOC Transition Note (Signed)
Transition of Care [facility]) - CM/SW Discharge Note   Patient Details  Name: [name] MRN: [id number] Date of Birth: May 24, 1941  [facility] CM/SW Contact:  Lennart Pall, LCSW Phone Number: 05/15/2021, 10:30 AM   Clinical Narrative:    Met briefly with pt and confirming he has received DME via [place]. Plan for OPPT at [facility].  No further TOC needs.   Final next level of care: OP Rehab Barriers to Discharge: No Barriers Identified   Patient Goals and CMS Choice Patient states their goals for this hospitalization and ongoing recovery are:: return home      Discharge Placement                       Discharge Plan and Services                DME Arranged: 3-N-1, Walker rolling DME Agency: [employer] Date DME Agency Contacted:  (ordered via MD office prior to surgery)                Social Determinants of Health (SDOH) Interventions     Readmission Risk Interventions No flowsheet data found.

## 2021-05-15 NOTE — Progress Notes (Signed)
Subjective: 1 Day Post-Op Procedure(s) (LRB): TOTAL KNEE ARTHROPLASTY (Right) Patient reports pain as mild.   Patient seen in rounds for Dr. Alvan Dame. Patient is well, and has had no acute complaints or problems. No acute events overnight. Foley catheter removed. Patient ambulated 50 feet with PT. He states his hips and back are uncomfortable lying in bed, and he is ready to get home today if possible. We will continue therapy today.   Objective: Vital signs in last 24 hours: Temp:  [97.8 F (36.6 C)-98.1 F (36.7 C)] 97.8 F (36.6 C) (06/29 0703) Pulse Rate:  [69-78] 78 (06/29 0703) Resp:  [13-18] 18 (06/29 0703) BP: (92-158)/(60-92) 117/62 (06/29 0703) SpO2:  [96 %-100 %] 99 % (06/29 0703)  Intake/Output from previous day:  Intake/Output Summary (Last 24 hours) at 05/15/2021 0858 Last data filed at 05/15/2021 0624 Gross per 24 hour  Intake 3299.7 ml  Output 1450 ml  Net 1849.7 ml     Intake/Output this shift: No intake/output data recorded.  Labs: Recent Labs    05/15/21 0315  HGB 9.6*   Recent Labs    05/15/21 0315  WBC 11.3*  RBC 3.06*  HCT 29.0*  PLT 150   Recent Labs    05/15/21 0315  NA 136  K 3.6  CL 105  CO2 22  BUN 44*  CREATININE 1.61*  GLUCOSE 150*  CALCIUM 8.3*   Recent Labs    05/14/21 0600 05/15/21 0315  INR 1.2 1.3*    Exam: General - Patient is Alert and Oriented Extremity - Neurologically intact Sensation intact distally Intact pulses distally Dorsiflexion/Plantar flexion intact Dressing - Bulky dressing removed. He had a skin tear/abrasion on the proximal medial incision, likely from the drapes intra-op. This is not oozy today. Re-dressed with adaptic, mepelex, and gauze.  Motor Function - intact, moving foot and toes well on exam.   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   Weight as of this encounter: 125.2 kg. Advance diet Up with therapy D/C IV fluids   Patient's anticipated LOS is less than 2 midnights, meeting these requirements: - Younger than 64 - Lives within 1 hour of care - Has a competent adult at home to recover with post-op recover - NO history of  - Chronic pain requiring opiods  - Diabetes  - Coronary Artery Disease  - Heart failure  - Heart attack  - Stroke  - DVT/VTE  - Cardiac arrhythmia  - Respiratory Failure/COPD  -  Renal failure  - Anemia  - Advanced Liver disease     DVT Prophylaxis - Coumadin Weight bearing as tolerated.  Plan is to go Home after hospital stay. Plan for likely discharge today following 1-2 sessions of PT as long as they are meeting their goals.   May need to re-dress the knee prior to discharge. Please text/call me if he is cleared to d/c.   Patient is scheduled for OPPT. Follow up in the office in 2 weeks.   Griffith Citron, PA-C Orthopedic Surgery [phone] 05/15/2021, 8:58 AM

## 2021-05-15 NOTE — Progress Notes (Signed)
Physical Therapy Treatment Patient Details Name: [name] MRN: [id number] DOB: [birth year] Today's Date: 05/15/2021    History of Present Illness 80 y.o. male admitted 05/14/21 for R TKA. PMH of pacemaker, prostate cancer, afib, gout, biceps rupture, obesity.    PT Comments    Pt is progressing well with mobility, he ambulated 110' with RW without loss of balance. Stair training and instruction in HEP completed. He is ready to DC home from PT standpoint.    Follow Up Recommendations  Follow surgeon's recommendation for DC plan and follow-up therapies     Equipment Recommendations  Rolling walker with 5" wheels;3in1 (PT)    Recommendations for Other Services       Precautions / Restrictions Precautions Precautions: Knee Precaution Booklet Issued: Yes (comment) Precaution Comments: reviewed no pillow under knee; pt is HOH    Mobility  Bed Mobility Overal bed mobility: Modified Independent             General bed mobility comments: used rail, HOB up    Transfers Overall transfer level: Needs assistance Equipment used: Rolling walker (2 wheeled) Transfers: Sit to/from Stand Sit to Stand: Min guard         General transfer comment: VCs hand placement  Ambulation/Gait Ambulation/Gait assistance: Min guard Gait Distance (Feet): 110 Feet Assistive device: Rolling walker (2 wheeled) Gait Pattern/deviations: Step-to pattern Gait velocity: decr   General Gait Details: mild unsteadiness (pt reports he's tremulous at baseline), no loss of balance,   Stairs Stairs: Yes Stairs assistance: Min assist Stair Management: No rails;Backwards Number of Stairs: 2 General stair comments: min A to manage/steady RW, VCs sequencing   Wheelchair Mobility    Modified Rankin (Stroke Patients Only)       Balance Overall balance assessment: Modified Independent                                          Cognition Arousal/Alertness:  Awake/alert Behavior During Therapy: WFL for tasks assessed/performed Overall Cognitive Status: Within Functional Limits for tasks assessed                                        Exercises Total Joint Exercises Ankle Circles/Pumps: AROM;Both;10 reps;Supine Quad Sets: AROM;Right;5 reps;Supine Short Arc Quad: AROM;Right;5 reps;Supine Heel Slides: AAROM;Right;5 reps;Supine Hip ABduction/ADduction: AAROM;Right;10 reps;Supine Straight Leg Raises: AROM;Right;5 reps;Supine Long Arc Quad: AROM;Right;5 reps;Seated Goniometric ROM: ~5-65* AAROM R knee    General Comments        Pertinent Vitals/Pain Pain Score: 2  Pain Location: R knee & thigh Pain Descriptors / Indicators: Sore Pain Intervention(s): Limited activity within patient's tolerance;Monitored during session;Premedicated before session;Ice applied    Home Living                      Prior Function            PT Goals (current goals can now be found in the care plan section) Acute Rehab PT Goals Patient Stated Goal: to walk PT Goal Formulation: With patient Time For Goal Achievement: 05/21/21 Potential to Achieve Goals: Good Progress towards PT goals: Progressing toward goals    Frequency    7X/week      PT Plan Current plan remains appropriate    Co-evaluation  AM-PAC PT "6 Clicks" Mobility   Outcome Measure  Help needed turning from your back to your side while in a flat bed without using bedrails?: A Little Help needed moving from lying on your back to sitting on the side of a flat bed without using bedrails?: A Little Help needed moving to and from a bed to a chair (including a wheelchair)?: A Little Help needed standing up from a chair using your arms (e.g., wheelchair or bedside chair)?: A Little Help needed to walk in hospital room?: A Little Help needed climbing 3-5 steps with a railing? : A Little 6 Click Score: 18    End of Session Equipment Utilized  During Treatment: Gait belt Activity Tolerance: Patient tolerated treatment well Patient left: in chair;with call bell/phone within reach;with chair alarm set Nurse Communication: Mobility status PT Visit Diagnosis: Difficulty in walking, not elsewhere classified (R26.2);Pain Pain - Right/Left: Right Pain - part of body: Knee     Time: 7824-2353 PT Time Calculation (min) (ACUTE ONLY): 17 min  Charges:  $Gait Training: 8-22 mins                    Blondell Reveal Kistler PT 05/15/2021  Acute Rehabilitation Services Pager [phone] Office [phone]

## 2021-05-15 NOTE — Progress Notes (Signed)
ANTICOAGULATION CONSULT NOTE - Initial Consult  Pharmacy Consult for warfarin Indication: atrial fibrillation and VTE prophylaxis  Allergies  Allergen Reactions   Oxycodone Other (See Comments)    Makes feel funny   Sertraline Hcl Other (See Comments)    Makes feel funny    Patient Measurements: Height: 6' (182.9 cm) Weight: 125.2 kg (276 lb) IBW/kg (Calculated) : 77.6  Vital Signs: Temp: 97.9 F (36.6 C) (06/29 1050) Temp Source: Oral (06/29 1050) BP: 130/75 (06/29 1050) Pulse Rate: 77 (06/29 1050)  Labs: Recent Labs    05/14/21 0600 05/15/21 0315  HGB  --  9.6*  HCT  --  29.0*  PLT  --  150  APTT 27  --   LABPROT 14.9 15.8*  INR 1.2 1.3*  CREATININE  --  1.61*    Estimated Creatinine Clearance: 51.7 mL/min (A) (by C-G formula based on SCr of 1.61 mg/dL (H)).  Medical History: Past Medical History:  Diagnosis Date   Arthritis    Chronic anticoagulation    Depression    Diabetes mellitus without complication ([place])    Dyspnea    Gout    History of echocardiogram    Echo 9/16:  EF 55-60%, no RWMA, mild MR, mild LAE, trivial PI   History of kidney stones    HTN (hypertension)    LV dysfunction    EF down to 30% per echo Feb 2013 -  started on ARB and low dose beta blocker   Morbid obesity ([place])    Nephrolithiasis    Osteoarthritis    Pacemaker 11/17/2008   Permanent atrial fibrillation ([place])    failed cardioversion in April of 2010   Prostate [facility])    Urosepsis     Medications:  Medications Prior to Admission  Medication Sig Dispense Refill Last Dose   acetaminophen (TYLENOL) 650 MG CR tablet Take 650 mg by mouth every 8 (eight) hours as needed for pain.   Past Week   allopurinol (ZYLOPRIM) 300 MG tablet TAKE ONE TABLET BY MOUTH ONCE DAILY (Patient taking differently: Take 300 mg by mouth at bedtime.) 30 tablet 0 Past Week   carvedilol (COREG) 3.125 MG tablet Take 1 tablet (3.125 mg total) by mouth 2 (two) times daily. Please schedule overdue  appt for future refills. 2nd attempt. (Patient taking differently: Take 6.25 mg by mouth at bedtime. Please schedule overdue appt for future refills. 2nd attempt.) 30 tablet 0 05/13/2021   celecoxib (CELEBREX) 100 MG capsule Take 100 mg by mouth daily.   Past Week   furosemide (LASIX) 20 MG tablet Take 20 mg by mouth daily as needed for edema.   Past Month   hydrochlorothiazide (HYDRODIURIL) 25 MG tablet Take 1 tablet by mouth once daily (Patient taking differently: Take 25 mg by mouth at bedtime.) 30 tablet 0 05/13/2021   JANUVIA 100 MG tablet Take 100 mg by mouth daily.   05/13/2021   losartan (COZAAR) 50 MG tablet TAKE 1 TABLET BY MOUTH ONCE DAILY.  PLEASE MAKE APPT WITH DR. ALLRED BEFORE ANYMORE REFILLS. (Patient taking differently: Take 50 mg by mouth at bedtime.) 15 tablet 1 05/13/2021   metFORMIN (GLUCOPHAGE) 500 MG tablet Take 500 mg by mouth at bedtime.   05/13/2021   pravastatin (PRAVACHOL) 20 MG tablet Take 20 mg by mouth at bedtime.    05/13/2021   triamcinolone cream (KENALOG) 0.1 % Apply 1 application topically as needed (rash).   Past Week   warfarin (COUMADIN) 5 MG tablet Take as directed  by coumadin clinic (Patient taking differently: Take 7.5 mg by mouth at bedtime.) 150 tablet 1 05/08/2021 at 1800   Scheduled:   allopurinol  300 mg Oral QHS   carvedilol  6.25 mg Oral QHS   celecoxib  200 mg Oral BID   dexamethasone (DECADRON) injection  10 mg Intravenous Once   docusate sodium  100 mg Oral BID   ferrous sulfate  325 mg Oral TID PC   hydrochlorothiazide  25 mg Oral QHS   insulin aspart  0-15 Units Subcutaneous TID WC   insulin aspart  0-5 Units Subcutaneous QHS   linagliptin  5 mg Oral Daily   losartan  50 mg Oral QHS   metFORMIN  500 mg Oral Q supper   pravastatin  20 mg Oral QHS   warfarin  7.5 mg Oral ONCE-1600   Warfarin - Pharmacist Dosing Inpatient   Does not apply q1600   Assessment: 80 yo male s/p R TKA now to restart his PTA warfarin on POD0 per Md orders. Patient  reportedly taking warfarin 7.5mg  qhs before admission. INR this AM 1.2 as expected.   Goal of Therapy:  INR 2-3 Monitor platelets by anticoagulation protocol: Yes  Today, 05/15/2021 INR 1.3 after 10mg  Warfarin Po intake wnl   Plan:  Warfarin 7.5mg  today at 1600 x 1 Daily INR ? Need Lovenox while INR subtherapeutic  Minda Ditto PharmD 05/15/2021,12:05 PM

## 2021-05-21 NOTE — Discharge Summary (Signed)
Physician Discharge Summary   Patient ID: [name] MRN: [id number] DOB/AGE: 79/21/1942 79 y.o.  Admit date: 05/14/2021 Discharge date: 05/15/2021  Primary Diagnosis: Right knee osteoarthritis.  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   Weight as of this encounter: 125.2 kg.  Procedure:  Procedure(s) (LRB): TOTAL KNEE ARTHROPLASTY (Right)   Consults: None  HPI:  [name] is a 80 y.o. male patient of  mine.  The patient had been seen, evaluated, and treated for months conservatively in the  office with medication, activity modification, and injections.  The patient had  radiographic changes of bone-on-bone arthritis with endplate sclerosis and osteophytes noted.  Based on the radiographic changes and failed conservative measures, the patient  decided to proceed with definitive treatment, total knee replacement.  Risks of infection, DVT, component failure, need for revision surgery, neurovascular injury were reviewed in the office setting.  The postop course was reviewed stressing the efforts to maximize post-operative satisfaction and function.  Consent was obtained for benefit of pain   relief.  Laboratory Data: Admission on 05/14/2021, Discharged on 05/15/2021  Component Date Value Ref Range Status   ABO/RH(D) 05/14/2021    Final                   Value:B NEG Performed at [facility], [place] [address], [place], [place] [address]    Prothrombin Time 05/14/2021 14.9  11.4 - 15.2 seconds Final   INR 05/14/2021 1.2  0.8 - 1.2 Final   Comment: (NOTE) INR goal varies based on device and disease states. Performed at [facility], [place] [address], [place], [place] [address]    aPTT 05/14/2021 27  24 - 36 seconds Final   Performed at [facility], [place] [address] [place], [place] [address]   Glucose-Capillary 05/14/2021 102 (A) 70 - 99 mg/dL Final   Glucose reference range applies only to samples taken after fasting for at least 8 hours.   Glucose-Capillary 05/14/2021 117 (A) 70 - 99 mg/dL Final   Glucose reference range applies only to samples taken after fasting for at least 8 hours.   Glucose-Capillary 05/14/2021 162 (A) 70 - 99 mg/dL Final   Glucose reference range applies only to samples taken after fasting for at least 8 hours.   Glucose-Capillary 05/14/2021 171 (A) 70 - 99 mg/dL Final   Glucose reference range applies only to samples taken after fasting for at least 8 hours.   WBC 05/15/2021 11.3 (A)  4.0 - 10.5 K/uL Final   RBC 05/15/2021 3.06 (A) 4.22 - 5.81 MIL/uL Final   Hemoglobin 05/15/2021 9.6 (A) 13.0 - 17.0 g/dL Final   HCT 05/15/2021 29.0 (A) 39.0 - 52.0 % Final   MCV 05/15/2021 94.8  80.0 - 100.0 fL Final   MCH 05/15/2021 31.4  26.0 - 34.0 pg Final   MCHC 05/15/2021 33.1  30.0 - 36.0 g/dL Final   RDW 05/15/2021 14.7  11.5 - 15.5 % Final   Platelets 05/15/2021 150  150 - 400 K/uL Final   nRBC 05/15/2021 0.0  0.0 - 0.2 % Final   Performed at [place] [address], [place], [place] [address]   Sodium 05/15/2021 136  135 - 145 mmol/L Final   Potassium 05/15/2021 3.6  3.5 - 5.1  mmol/L Final   Chloride 05/15/2021 105  98 - 111 mmol/L Final   CO2 05/15/2021 22  22 - 32 mmol/L Final   Glucose, Bld 05/15/2021 150 (A) 70 - 99 mg/dL Final   Glucose reference range applies only to samples taken after fasting for at least 8 hours.   BUN 05/15/2021 44 (A) 8 - 23 mg/dL Final   Creatinine, Ser 05/15/2021 1.61 (A) 0.61 - 1.24 mg/dL Final   Calcium 05/15/2021 8.3 (A) 8.9 - 10.3 mg/dL Final   GFR, Estimated 05/15/2021 44 (A) >60 mL/min Final   Comment: (NOTE) Calculated using the CKD-EPI Creatinine Equation (2021)    Anion gap 05/15/2021 9  5 - 15 Final   Performed at [facility], [place] [address], [place], [place] [address]   Prothrombin Time 05/15/2021 15.8 (A) 11.4 - 15.2 seconds Final   INR 05/15/2021 1.3 (A) 0.8 - 1.2 Final   Comment: (NOTE) INR goal varies based on device and disease states. Performed at [facility], [place] [address] [place],  [address]    Glucose-Capillary 05/14/2021 168 (A) 70 - 99 mg/dL Final   Glucose reference range applies only to samples taken after fasting for at least 8 hours.   Glucose-Capillary 05/15/2021 138 (A) 70 - 99 mg/dL Final   Glucose reference range applies only to samples taken after fasting for at least 8 hours.   Glucose-Capillary 05/15/2021 105 (A) 70 - 99 mg/dL Final   Glucose reference range applies only to samples taken after fasting for at least 8 hours.  Hospital Outpatient Visit on 05/10/2021  Component Date Value Ref Range Status   SARS Coronavirus 2 05/10/2021 NEGATIVE  NEGATIVE Final   Comment: (NOTE) SARS-CoV-2 target nucleic acids are NOT DETECTED.  The SARS-CoV-2 RNA is generally detectable in upper and lower respiratory specimens during the acute phase of infection. Negative results do not preclude SARS-CoV-2 infection, do not rule out co-infections with other pathogens, and should not be used as the sole basis for treatment or other patient management  decisions. Negative results must be combined with clinical observations, patient history, and epidemiological information. The expected result is Negative.  Fact Sheet for Patients: SugarRoll.be  Fact Sheet for Healthcare Providers: https://www.woods-mathews.com/  This test is not yet approved or cleared by the [place] FDA and  has been authorized for detection and/or diagnosis of SARS-CoV-2 by FDA under an Emergency Use Authorization (EUA). This EUA will remain  in effect (meaning this test can be used) for the duration of the COVID-19 declaration under Se  ction 564(b)(1) of the Act, 21 U.S.C. section 360bbb-3(b)(1), unless the authorization is terminated or revoked sooner.  Performed at [facility], [place] [address] [place], [place] [address]   Hospital Outpatient Visit on 05/08/2021  Component Date Value Ref Range Status   MRSA, PCR 05/08/2021 NEGATIVE  NEGATIVE Final   Staphylococcus aureus 05/08/2021 NEGATIVE  NEGATIVE Final   Comment: (NOTE) The Xpert SA Assay (FDA approved for NASAL specimens in patients 41 years of age and older), is one component of a comprehensive surveillance program. It is not intended to diagnose infection nor to guide or monitor treatment. Performed at [facility], [place] [address] [place], [place] [address]    Hgb A1c MFr Bld 05/08/2021 5.8 (A) 4.8 - 5.6 % Final   Comment: (NOTE) Pre diabetes:          5.7%-6.4%  Diabetes:              >6.4%  Glycemic control for   <7.0% adults with diabetes    Mean Plasma Glucose 05/08/2021 119.76  mg/dL Final   Performed at [facility], [place] [address] [place], [place] [address]   Sodium 05/08/2021 139  135 - 145 mmol/L Final   Potassium 05/08/2021 4.1  3.5 - 5.1 mmol/L Final   Chloride 05/08/2021 105  98 - 111 mmol/L Final   CO2 05/08/2021 28  22 - 32 mmol/L Final   Glucose, Bld 05/08/2021 113 (A)  70 - 99 mg/dL Final   Glucose reference range applies only to samples taken after fasting for at least 8 hours.   BUN 05/08/2021 36 (A) 8 - 23 mg/dL Final   Creatinine, Ser 05/08/2021 1.72 (A) 0.61 - 1.24 mg/dL Final   Calcium 05/08/2021 9.6  8.9 - 10.3 mg/dL Final   Total Protein 05/08/2021 7.3  6.5 - 8.1 g/dL Final   Albumin 05/08/2021 4.0  3.5 - 5.0 g/dL Final   AST 05/08/2021 17  15 - 41 U/L Final   ALT 05/08/2021 11  0 - 44 U/L Final   Alkaline Phosphatase 05/08/2021 68  38 - 126 U/L Final   Total Bilirubin 05/08/2021 1.0  0.3 - 1.2 mg/dL Final   GFR, Estimated 05/08/2021 40 (A) >60 mL/min Final   Comment: (NOTE) Calculated using the CKD-EPI Creatinine Equation (2021)    Anion gap 05/08/2021 6  5 - 15 Final   Performed at [facility], [place] [address], [place], [place] [address]   ABO/RH(D) 05/08/2021 B NEG   Final   Antibody Screen 05/08/2021 NEG   Final   Sample Expiration 05/08/2021 05/17/2021,2359   Final   Extend sample reason 05/08/2021    Final                   Value:NO TRANSFUSIONS OR PREGNANCY IN THE PAST 3 MONTHS Performed at [place] [address], [place], [place] [address]    Glucose-Capillary 05/08/2021 100 (A) 70 - 99 mg/dL Final   Glucose reference range applies only to samples taken after fasting for at least 8 hours.  Office Visit on 04/25/2021  Component Date Value Ref Range Status   Glucose 04/25/2021 103 (A) 65 - 99 mg/dL Final   BUN 04/25/2021 31 (A) 8 - 27 mg/dL Final   Creatinine, Ser 04/25/2021 1.58 (A) 0.76 - 1.27 mg/dL Final   eGFR 04/25/2021 44 (A) >59 mL/min/1.73 Final   BUN/Creatinine Ratio 04/25/2021 20  10 - 24 Final  Sodium 04/25/2021 140  134 - 144 mmol/L Final   Potassium 04/25/2021 4.0  3.5 - 5.2 mmol/L Final   Chloride 04/25/2021 103  96 - 106 mmol/L Final   CO2 04/25/2021 21  20 - 29 mmol/L Final   Calcium 04/25/2021 9.4  8.6 - 10.2 mg/dL Final   WBC 04/25/2021 6.5  3.4 - 10.8 x10E3/uL Final    RBC 04/25/2021 4.15  4.14 - 5.80 x10E6/uL Final   Hemoglobin 04/25/2021 12.6 (A) 13.0 - 17.7 g/dL Final   Hematocrit 04/25/2021 38.4  37.5 - 51.0 % Final   MCV 04/25/2021 93  79 - 97 fL Final   MCH 04/25/2021 30.4  26.6 - 33.0 pg Final   MCHC 04/25/2021 32.8  31.5 - 35.7 g/dL Final   RDW 04/25/2021 13.9  11.6 - 15.4 % Final   Platelets 04/25/2021 202  150 - 450 x10E3/uL Final   Date Time Interrogation Session 04/25/2021 [id number]   Final   Pulse Generator Manufacturer 04/25/2021 MERM   Final   Pulse Gen Model 04/25/2021 [id number] Adapta   Final   Pulse Gen Serial Number 04/25/2021 [id number]   Final   Clinic Name 04/25/2021 [place]   Final   Implantable Pulse Generator Type 04/25/2021 Implantable Pulse Generator   Final   Implantable Pulse Generator Implan* 04/25/2021 [id number]   Final   Implantable Lead Manufacturer 04/25/2021 BOST   Final   Implantable Lead Model 04/25/2021 4470 Fineline II [employer] EZ   Final   Implantable Lead Serial Number 04/25/2021 [id number]   Final   Implantable Lead Implant Date 04/25/2021 [id number]   Final   Implantable Lead Location 04/25/2021 [id number]   Final   Implantable Lead Manufacturer 04/25/2021 BOST   Final   Implantable Lead Model 04/25/2021 4471 Fineline II Sterox EZ   Final   Implantable Lead Serial Number 04/25/2021 [id number]   Final   Implantable Lead Implant Date 04/25/2021 [id number]   Final   Implantable Lead Location Detail 1 04/25/2021 APEX   Final   Implantable Lead Location 04/25/2021 [id number]   Final   Lead Channel Setting Sensing Sensi* 04/25/2021 4.00  mV Final   Lead Channel Setting Pacing Pulse * 04/25/2021 0.40  ms Final   Lead Channel Setting Pacing Amplit* 04/25/2021 2.500  V Final   Lead Channel Impedance Value 04/25/2021 67  ohm Final   Lead Channel Impedance Value 04/25/2021 504  ohm Final   Lead Channel Pacing Threshold Ampl* 04/25/2021 1.000  V Final   Lead Channel Pacing Threshold Puls* 04/25/2021 0.40  ms Final   Lead  Channel Pacing Threshold Ampl* 04/25/2021 1.250  V Final   Lead Channel Pacing Threshold Puls* 04/25/2021 0.40  ms Final   Battery Status 04/25/2021 OK   Final   Battery Remaining Longevity 04/25/2021 98  mo Final   Battery Voltage 04/25/2021 2.78  V Final   Battery Impedance 04/25/2021 379  ohm Final   Brady Statistic RV Percent Paced 04/25/2021 99  % Final     X-Rays:CUP PACEART INCLINIC DEVICE CHECK  Result Date: 04/25/2021 Pacemaker check in clinic. Normal device function. Thresholds, sensing, impedances consistent with previous measurements. Device programmed to maximize longevity. No mode switch or high ventricular rates noted. VVI device. Device programmed at appropriate safety margins. Histogram distribution appropriate for patient activity level. Estimated longevity 8 yr, 2 mo. Patient enrolled in remote follow-up/TTM's with [employer]. Patient education completed.   EKG: Orders placed or performed in visit on 04/25/21   EKG 12-Lead  Hospital Course: [name] is a 80 y.o. who was admitted to [facility]. They were brought to the operating room on 05/14/2021 and underwent Procedure(s): TOTAL KNEE ARTHROPLASTY.  Patient tolerated the procedure well and was later transferred to the recovery room and then to the orthopaedic floor for postoperative care. They were given PO and IV analgesics for pain control following their surgery. They were given 24 hours of postoperative antibiotics of  Anti-infectives (From admission, onward)    Start     Dose/Rate Route Frequency Ordered Stop   05/15/21 0000  cephALEXin (KEFLEX) 500 MG capsule        500 mg Oral 4 times daily 05/15/21 1306 05/22/21 2359   05/14/21 1330  ceFAZolin (ANCEF) IVPB 2g/100 mL premix        2 g 200 mL/hr over 30 Minutes Intravenous Every 6 hours 05/14/21 1048 05/15/21 0739   05/14/21 0600  ceFAZolin (ANCEF) IVPB 3g/100 mL premix        3 g 200 mL/hr over 30 Minutes Intravenous On call to O.R. 05/14/21 0518  05/14/21 0802      and started on DVT prophylaxis in the form of Coumadin.   PT and OT were ordered for total joint protocol. Discharge planning consulted to help with postop disposition and equipment needs.  Patient had a good night on the evening of surgery. They started to get up OOB with therapy on POD #0. Pt was seen during rounds and was ready to go home pending progress with therapy.He worked with therapy on POD #1 and was meeting his goals. Pt was discharged to home later that day in stable condition.  Diet: Regular diet Activity: WBAT Follow-up: in 2 weeks Disposition: Home Discharged Condition: good   Discharge Instructions     Call MD / Call 911   Complete by: As directed    If you experience chest pain or shortness of breath, CALL 911 and be transported to the hospital emergency room.  If you develope a fever above 101 F, pus (white drainage) or increased drainage or redness at the wound, or calf pain, call your surgeon's office.   Change dressing   Complete by: As directed    Keep the incision clean and dry for 3 days post operatively. You may shower on Friday after removing the dressings. Then reapply Adaptic, gauze, and tape. Change dressings as needed when soiled or prior to showering.   Constipation Prevention   Complete by: As directed    Drink plenty of fluids.  Prune juice may be helpful.  You may use a stool softener, such as Colace (over the counter) 100 mg twice a day.  Use MiraLax (over the counter) for constipation as needed.   Diet - low sodium heart healthy   Complete by: As directed    Increase activity slowly as tolerated   Complete by: As directed    Weight bearing as tolerated with assist device (walker, cane, etc) as directed, use it as long as suggested by your surgeon or therapist, typically at least 4-6 weeks.   Post-operative opioid taper instructions:   Complete by: As directed    POST-OPERATIVE OPIOID TAPER INSTRUCTIONS: It is important to wean off  of your opioid medication as soon as possible. If you do not need pain medication after your surgery it is ok to stop day one. Opioids include: Codeine, Hydrocodone(Norco, Vicodin), Oxycodone(Percocet, oxycontin) and hydromorphone amongst others.  Long term and even short term use of opiods can  cause: Increased pain response Dependence Constipation Depression Respiratory depression And more.  Withdrawal symptoms can include Flu like symptoms Nausea, vomiting And more Techniques to manage these symptoms Hydrate well Eat regular healthy meals Stay active Use relaxation techniques(deep breathing, meditating, yoga) Do Not substitute Alcohol to help with tapering If you have been on opioids for less than two weeks and do not have pain than it is ok to stop all together.  Plan to wean off of opioids This plan should start within one week post op of your joint replacement. Maintain the same interval or time between taking each dose and first decrease the dose.  Cut the total daily intake of opioids by one tablet each day Next start to increase the time between doses. The last dose that should be eliminated is the evening dose.      TED hose   Complete by: As directed    Use stockings (TED hose) for 2 weeks on both leg(s).  You may remove them at night for sleeping.      Allergies as of 05/15/2021       Reactions   Oxycodone Other (See Comments)   Makes feel funny   Sertraline Hcl Other (See Comments)   Makes feel funny        Medication List     STOP taking these medications    acetaminophen 650 MG CR tablet Commonly known as: TYLENOL       TAKE these medications    celecoxib 100 MG capsule Commonly known as: CELEBREX Take 100 mg by mouth daily.   cephALEXin 500 MG capsule Commonly known as: KEFLEX Take 1 capsule (500 mg total) by mouth 4 (four) times daily for 7 days.   docusate sodium 100 MG capsule Commonly known as: COLACE Take 1 capsule (100 mg total)  by mouth 2 (two) times daily.   furosemide 20 MG tablet Commonly known as: LASIX Take 20 mg by mouth daily as needed for edema.   HYDROcodone-acetaminophen 5-325 MG tablet Commonly known as: NORCO/VICODIN Take 1-2 tablets by mouth every 4 (four) hours as needed.   Januvia 100 MG tablet Generic drug: sitaGLIPtin Take 100 mg by mouth daily.   metFORMIN 500 MG tablet Commonly known as: GLUCOPHAGE Take 500 mg by mouth at bedtime.   methocarbamol 500 MG tablet Commonly known as: ROBAXIN Take 1 tablet (500 mg total) by mouth every 6 (six) hours as needed for muscle spasms.   polyethylene glycol 17 g packet Commonly known as: MIRALAX / GLYCOLAX Take 17 g by mouth daily as needed for mild constipation.   pravastatin 20 MG tablet Commonly known as: PRAVACHOL Take 20 mg by mouth at bedtime.   triamcinolone cream 0.1 % Commonly known as: KENALOG Apply 1 application topically as needed (rash).       ASK your doctor about these medications    allopurinol 300 MG tablet Commonly known as: ZYLOPRIM TAKE ONE TABLET BY MOUTH ONCE DAILY   carvedilol 3.125 MG tablet Commonly known as: COREG Take 1 tablet (3.125 mg total) by mouth 2 (two) times daily. Please schedule overdue appt for future refills. 2nd attempt.   hydrochlorothiazide 25 MG tablet Commonly known as: HYDRODIURIL Take 1 tablet by mouth once daily   losartan 50 MG tablet Commonly known as: COZAAR TAKE 1 TABLET BY MOUTH ONCE DAILY.  PLEASE MAKE APPT WITH DR. ALLRED BEFORE ANYMORE REFILLS.   warfarin 5 MG tablet Commonly known as: COUMADIN Take as directed by coumadin clinic  Discharge Care Instructions  (From admission, onward)           Start     Ordered   05/15/21 0000  Change dressing       Comments: Keep the incision clean and dry for 3 days post operatively. You may shower on Friday after removing the dressings. Then reapply Adaptic, gauze, and tape. Change dressings as needed when  soiled or prior to showering.   05/15/21 1305            Follow-up Information     Paralee Cancel, MD. Schedule an appointment as soon as possible for a visit in 2 week(s).   Specialty: Orthopedic Surgery Contact information: [address] [place] [address] [phone]                 Signed: Griffith Citron, PA-C Orthopedic Surgery 05/21/2021, 9:19 AM

## 2021-06-07 ENCOUNTER — Ambulatory Visit (INDEPENDENT_AMBULATORY_CARE_PROVIDER_SITE_OTHER): Payer: Medicare Other

## 2021-06-07 DIAGNOSIS — I442 Atrioventricular block, complete: Secondary | ICD-10-CM | POA: Diagnosis not present

## 2021-06-10 LAB — CUP PACEART REMOTE DEVICE CHECK
Battery Impedance: 404 Ohm
Battery Remaining Longevity: 91 mo
Battery Voltage: 2.78 V
Brady Statistic RV Percent Paced: 99 %
Date Time Interrogation Session: 20220722100148
Implantable Lead Implant Date: 20100125
Implantable Lead Implant Date: 20100125
Implantable Lead Location: 753859
Implantable Lead Location: 753860
Implantable Lead Model: 4470
Implantable Lead Model: 4471
Implantable Lead Serial Number: 475977
Implantable Lead Serial Number: 639547
Implantable Pulse Generator Implant Date: 20160930
Lead Channel Impedance Value: 407 Ohm
Lead Channel Impedance Value: 67 Ohm
Lead Channel Pacing Threshold Amplitude: 1.125 V
Lead Channel Pacing Threshold Pulse Width: 0.4 ms
Lead Channel Setting Pacing Amplitude: 2.5 V
Lead Channel Setting Pacing Pulse Width: 0.4 ms
Lead Channel Setting Sensing Sensitivity: 4 mV

## 2021-07-01 NOTE — Progress Notes (Signed)
Remote pacemaker transmission.   

## 2021-09-19 ENCOUNTER — Telehealth: Payer: Self-pay | Admitting: Internal Medicine

## 2021-09-19 ENCOUNTER — Other Ambulatory Visit: Payer: Self-pay | Admitting: Urology

## 2021-09-19 NOTE — Telephone Encounter (Signed)
   [facility] Pre-operative Risk Assessment    Patient Name: [name]  DOB: 1941-02-25 MRN: [id number]  HEARTCARE STAFF:  - IMPORTANT!!!!!! Under Visit Info/Reason for Call, type in Other and utilize the format Clearance MM/DD/YY or Clearance TBD. Do not use dashes or single digits. - Please review there is not already an duplicate clearance open for this procedure. - If request is for dental extraction, please clarify the # of teeth to be extracted. - If the patient is currently at the dentist's office, call Pre-Op Callback Staff (MA/nurse) to input urgent request.  - If the patient is not currently in the dentist office, please route to the Pre-Op pool.  Request for surgical clearance:  What type of surgery is being performed? Urethroscopy and possible biopsy of renal mass  When is this surgery scheduled? 10/14/21  What type of clearance is required (medical clearance vs. Pharmacy clearance to hold med vs. Both)? both  Are there any medications that need to be held prior to surgery and how long? Coumadin held 5 days prior to surgery  Practice name and name of physician performing surgery? [facility] / Dr. Preston Fleeting  What is the office phone number? [phone] ext [phone]   7.   What is the office fax number? [phone]  8.   Anesthesia type (None, local, MAC, general) ? General    Ermelinda Das 09/19/2021, 11:57 AM  _________________________________________________________________   (provider comments below)

## 2021-09-19 NOTE — Telephone Encounter (Signed)
   Name: [name]  DOB: Mar 25, 1941  MRN: [id number]  Primary Cardiologist: None- Allred in 2020  Chart reviewed as part of pre-operative protocol coverage. Because of [name]'s past medical history and time since last visit, he will require a follow-up visit in order to better assess preoperative cardiovascular risk.  Pre-op covering staff: - Please schedule appointment and call patient to inform them. If patient already had an upcoming appointment within acceptable timeframe, please add "pre-op clearance" to the appointment notes so provider is aware. - Please contact requesting surgeon's office via preferred method (i.e, phone, fax) to inform them of need for appointment prior to surgery.  If applicable, this message will also be routed to pharmacy pool and/or primary cardiologist for input on holding anticoagulant/antiplatelet agent as requested below so that this information is available to the clearing provider at time of patient's appointment.   Ledora Bottcher, PA  09/19/2021, 3:38 PM

## 2021-09-23 NOTE — Telephone Encounter (Signed)
[name] for EP has tried x 2 reach pt though [name] VM is full and can't leave a message on home number.    I tried the [phone] and his wife answered. She said they were at [facility]. I told her that [place], [place] Scheduler has been trying to reach him for appt and that vm is full. She said yeah he doesn't know how to really work his phone. I explained that I thought I would see if I could reach the pt to help out [place]. I asked if she could please have the pt call and ask to s/w [facility] for appt. Wife stated he was in bathroom at [facility] and that she saw phone came up [facility] and thought she should answer it. Pt's wife said she will have him call as soon as he comes out of the bathroom. I thanked her for the help. His wife's name is also Engineer, production.

## 2021-09-23 NOTE — Telephone Encounter (Signed)
Pt has appt 10/02/21 at 2:45 with Tommye Standard, PAC. I will forward notes to [facility] for upcoming appt. Will send FYI to requesting office pt has appt 10/02/21.

## 2021-09-29 NOTE — Progress Notes (Addendum)
Cardiology Office Note Date:  10/02/2021  Patient ID:  [name], [name] 1941-02-14, MRN [id number] PCP:  Shon Baton, MD  Electrophysiologist: [name]     Chief Complaint: pre-op  History of Present Illness: [name] is a 79 y.o. male with history of arthritis, HTN, permanent afib, CHB w/PPM, obesity, DM, hx of CM with recovered LVEF by last echo  He comes in today to be seen for [name], last seen by him via tele health visit Aug 2020, he was doing well, no changes were made  He saw A. Tillery, PA-C this summer, cleared for knee surgery   He needs clearance for Urethroscopy and possible biopsy of renal mass, scheduled for 10/14/21 Needs to hold warfarin 5 days  TODAY He is doing well. Since his knee surgery this summer and recovered from that his ability to be more active and exertional activities have increased quite a bit. He denies any kind of CP, palpitations or cardiac awareness No rest or positional SOB, he will get a little winded with significantly increased activities, though this is his baseline and denies difficulties with ADLs His PMD manages his warfarin and will be peri-operatively as well. No near syncope or syncope No bleeding or signs of bleeding   RCRI score is zero, 0.4% risk   Device information MDT dual chamber PPM implanted 12/11/2008, gen change 08/17/2015 Programmed VVIR   Past Medical History:  Diagnosis Date   Arthritis    Chronic anticoagulation    Depression    Diabetes mellitus without complication ([place])    Dyspnea    Gout    History of echocardiogram    Echo 9/16:  EF 55-60%, no RWMA, mild MR, mild LAE, trivial PI   History of kidney stones    HTN (hypertension)    LV dysfunction    EF down to 30% per echo Feb 2013 -  started on ARB and low dose beta blocker   Morbid obesity ([place])    Nephrolithiasis    Osteoarthritis    Pacemaker 11/17/2008   Permanent atrial fibrillation ([place])    failed cardioversion in April of  2010   Prostate CA [facility])    Urosepsis     Past Surgical History:  Procedure Laterality Date   CARDIOVERSION  02/15/2009   failed   EP IMPLANTABLE DEVICE N/A 08/17/2015   Procedure: PPM Generator Changeout;  Surgeon: Thompson Grayer, MD;  Location: [place] [facility];  Service: Cardiovascular;  Laterality: N/A;   PACEMAKER PLACEMENT  11/17/2008   Medtronic   RADIOACTIVE SEED IMPLANT     SHOULDER SURGERY     TOTAL KNEE ARTHROPLASTY     TOTAL KNEE ARTHROPLASTY Right 05/14/2021   Procedure: TOTAL KNEE ARTHROPLASTY;  Surgeon: Paralee Cancel, MD;  Location: [facility];  Service: Orthopedics;  Laterality: Right;   WISDOM TOOTH EXTRACTION      Current Outpatient Medications  Medication Sig Dispense Refill   allopurinol (ZYLOPRIM) 300 MG tablet TAKE ONE TABLET BY MOUTH ONCE DAILY 30 tablet 0   carvedilol (COREG) 3.125 MG tablet Take 1 tablet (3.125 mg total) by mouth 2 (two) times daily. Please schedule overdue appt for future refills. 2nd attempt. 30 tablet 0   furosemide (LASIX) 20 MG tablet Take 20 mg by mouth daily as needed for edema.     hydrochlorothiazide (HYDRODIURIL) 25 MG tablet Take 1 tablet by mouth once daily 30 tablet 0   JANUVIA 100 MG tablet Take 100 mg by mouth daily.  losartan (COZAAR) 50 MG tablet TAKE 1 TABLET BY MOUTH ONCE DAILY.  PLEASE MAKE APPT WITH DR. ALLRED BEFORE ANYMORE REFILLS. 15 tablet 1   metFORMIN (GLUCOPHAGE) 500 MG tablet Take 500 mg by mouth at bedtime.     methocarbamol (ROBAXIN) 500 MG tablet Take 1 tablet (500 mg total) by mouth every 6 (six) hours as needed for muscle spasms. 40 tablet 0   pravastatin (PRAVACHOL) 20 MG tablet Take 20 mg by mouth at bedtime.      triamcinolone cream (KENALOG) 0.1 % Apply 1 application topically as needed (rash).     warfarin (COUMADIN) 5 MG tablet Take as directed by coumadin clinic 150 tablet 1   No current facility-administered medications for this visit.    Allergies:   Oxycodone and Sertraline hcl   Social  History:  The patient  reports that he has never smoked. He has never used smokeless tobacco. He reports that he does not drink alcohol and does not use drugs.   Family History:  The patient's family history includes Healthy in his brother and mother; Heart attack (age of onset: 89) in his father.  ROS:  Please see the history of present illness.    All other systems are reviewed and otherwise negative.   PHYSICAL EXAM:  VS:  BP 108/62   Pulse 79   Ht 6' (1.829 m)   Wt 272 lb 6.4 oz (123.6 kg)   SpO2 97%   BMI 36.94 kg/m  BMI: Body mass index is 36.94 kg/m. Well nourished, well developed, in no acute distress HEENT: normocephalic, atraumatic Neck: no JVD, carotid bruits or masses Cardiac:  RRR (paced); no significant murmurs, no rubs, or gallops Lungs:  CTA b/l, no wheezing, rhonchi or rales Abd: soft, nontender MS: no deformity or atrophy Ext: trace pitting/more brawny type edema LLE to above the ankle, lass on the right (he reports this as his baseline for years, is better in the AM and accumulates through the day, rarely uses his PRN lasix Skin: warm and dry, no rash Neuro:  No gross deficits appreciated Psych: euthymic mood, full affect  PPM site is stable, no tethering or discomfort   EKG:  Done today and reviewed by myself shows  AF, V paced 79bpm  Device interrogation done today and reviewed by myself:  Battery and lead measurements are good  No R waves at 45today One HVR episode is brief RVR 99%VP   11/15/2015; TTE Study Conclusions  - Left ventricle: The cavity size was normal. Wall thickness was    normal. Systolic function was normal. The estimated ejection    fraction was in the range of 50% to 55%.  - Left atrium: The atrium was mildly dilated.  - Atrial septum: No defect or patent foramen ovale was identified.  Recent Labs: 05/08/2021: ALT 11 05/15/2021: BUN 44; Creatinine, Ser 1.61; Hemoglobin 9.6; Platelets 150; Potassium 3.6; Sodium 136  No results  found for requested labs within last 8760 hours.   CrCl cannot be calculated (Patient's most recent lab result is older than the maximum 21 days allowed.).   Wt Readings from Last 3 Encounters:  10/02/21 272 lb 6.4 oz (123.6 kg)  05/14/21 276 lb (125.2 kg)  04/25/21 276 lb (125.2 kg)     Other studies reviewed: Additional studies/records reviewed today include: summarized above  ASSESSMENT AND PLAN:  PPM Intact function No programming changes made  Permanent Afib CHA2DS2Vasc is 5, on warfarin, monitored and managed with his PMD  HTN Looks  OK, no changes  4.  Hx of CM  w/recovered LVEF by his last echo, 2016 No symptoms to suggest clinical change Chronic LE edema, he reports for years, sounds of dependent edema, rare use of his PRN lasix  Pre-op Low risk procedure, low risk score DUKE is 7METS No need for pre-operative cardiac testing No cardiac contraindication for planned procedure routine peri-procedure PPM management Resume warfarin as soon as deemed safe to do so by surgeon, management with his PMD   Disposition: F/u with remotes as usual, in clinic in clinic in 52mo, sooner if needed   Current medicines are reviewed at length with the patient today.  The patient did not have any concerns regarding medicines.  Venetia Night, PA-C 10/02/2021 5:41 PM     [place] [address] [phone] (office)  [phone] (fax)

## 2021-10-02 ENCOUNTER — Encounter: Payer: Self-pay | Admitting: Physician Assistant

## 2021-10-02 ENCOUNTER — Ambulatory Visit: Payer: Medicare Other | Admitting: Physician Assistant

## 2021-10-02 ENCOUNTER — Other Ambulatory Visit: Payer: Self-pay

## 2021-10-02 VITALS — BP 108/62 | HR 79 | Ht 72.0 in | Wt 272.4 lb

## 2021-10-02 DIAGNOSIS — I429 Cardiomyopathy, unspecified: Secondary | ICD-10-CM

## 2021-10-02 DIAGNOSIS — I442 Atrioventricular block, complete: Secondary | ICD-10-CM | POA: Diagnosis not present

## 2021-10-02 DIAGNOSIS — I4821 Permanent atrial fibrillation: Secondary | ICD-10-CM

## 2021-10-02 DIAGNOSIS — I5022 Chronic systolic (congestive) heart failure: Secondary | ICD-10-CM | POA: Diagnosis not present

## 2021-10-02 DIAGNOSIS — Z01810 Encounter for preprocedural cardiovascular examination: Secondary | ICD-10-CM

## 2021-10-02 DIAGNOSIS — I10 Essential (primary) hypertension: Secondary | ICD-10-CM

## 2021-10-02 DIAGNOSIS — Z950 Presence of cardiac pacemaker: Secondary | ICD-10-CM

## 2021-10-02 DIAGNOSIS — Z01818 Encounter for other preprocedural examination: Secondary | ICD-10-CM

## 2021-10-02 LAB — CUP PACEART INCLINIC DEVICE CHECK
Battery Impedance: 478 Ohm
Battery Remaining Longevity: 87 mo
Battery Voltage: 2.78 V
Brady Statistic RV Percent Paced: 99 %
Date Time Interrogation Session: 20221116184422
Implantable Lead Implant Date: 20100125
Implantable Lead Implant Date: 20100125
Implantable Lead Location: 753859
Implantable Lead Location: 753860
Implantable Lead Model: 4470
Implantable Lead Model: 4471
Implantable Lead Serial Number: 475977
Implantable Lead Serial Number: 639547
Implantable Pulse Generator Implant Date: 20160930
Lead Channel Impedance Value: 452 Ohm
Lead Channel Impedance Value: 67 Ohm
Lead Channel Pacing Threshold Amplitude: 1.125 V
Lead Channel Pacing Threshold Amplitude: 1.25 V
Lead Channel Pacing Threshold Pulse Width: 0.4 ms
Lead Channel Pacing Threshold Pulse Width: 0.4 ms
Lead Channel Setting Pacing Amplitude: 2.5 V
Lead Channel Setting Pacing Pulse Width: 0.4 ms
Lead Channel Setting Sensing Sensitivity: 4 mV

## 2021-10-02 NOTE — Patient Instructions (Signed)
Medication Instructions:   Your physician recommends that you continue on your current medications as directed. Please refer to the Current Medication list given to you today.  *If you need a refill on your cardiac medications before your next appointment, please call your pharmacy*   Lab Work: [place]   If you have labs (blood work) drawn today and your tests are completely normal, you will receive your results only by: [place] (if you have MyChart) OR A paper copy in the mail If you have any lab test that is abnormal or we need to change your treatment, we will call you to review the results.   Testing/Procedures: NONE ORDERED  TODAY     Follow-Up: At [facility], you and your health needs are our priority.  As part of our continuing mission to provide you with exceptional heart care, we have created designated Provider Care Teams.  These Care Teams include your primary Cardiologist (physician) and Advanced Practice Providers (APPs -  Physician Assistants and Nurse Practitioners) who all work together to provide you with the care you need, when you need it.  We recommend signing up for the patient portal called "MyChart".  Sign up information is provided on this After Visit Summary.  MyChart is used to connect with patients for Virtual Visits (Telemedicine).  Patients are able to view lab/test results, encounter notes, upcoming appointments, etc.  Non-urgent messages can be sent to your provider as well.   To learn more about what you can do with MyChart, go to [email or web address].    Your next appointment:   6 month(s)  The format for your next appointment:   In Person  Provider:   You may see Dr. Caryl Comes or one of the following Advanced Practice Providers on your designated Care Team:   Tommye Standard, [place] Legrand Como "Jonni Sanger" Chalmers Cater, [place]   Other Instructions

## 2021-10-08 ENCOUNTER — Other Ambulatory Visit: Payer: Self-pay

## 2021-10-08 ENCOUNTER — Encounter (HOSPITAL_BASED_OUTPATIENT_CLINIC_OR_DEPARTMENT_OTHER): Payer: Self-pay | Admitting: Urology

## 2021-10-08 ENCOUNTER — Encounter: Payer: Self-pay | Admitting: Internal Medicine

## 2021-10-08 DIAGNOSIS — R251 Tremor, unspecified: Secondary | ICD-10-CM

## 2021-10-08 HISTORY — DX: Tremor, unspecified: R25.1

## 2021-10-08 NOTE — Progress Notes (Signed)
PERIOPERATIVE PRESCRIPTION FOR IMPLANTED CARDIAC DEVICE PROGRAMMING  Patient Information: Name:  [name]  DOB:  Dec 09, 1940  MRN:  [id number]     Planned Procedure: cystoscopy with retrograde pyelogram ureteroscopy with possible biopsy and stent placement left  Surgeon: Dr Franchot Gallo  Date of Procedure: 10-14-2021  Cautery will be used.  Position during surgery: n/a   Please send documentation back to:  [place] (Fax # [phone])   Hilda Blades, RN  10/07/2021 4:39 PM     Device Information:  Clinic EP Physician:  Thompson Grayer, MD   Device Type:  Pacemaker Manufacturer and Phone #:  Medtronic: [id number] Medtronic [id number] Adapta Pacemaker Dependent?:  Yes.   Date of Last Device Check:  10/02/21 in clinic Normal Device Function?:  Yes.    Electrophysiologist's Recommendations:  Have magnet available. Provide continuous ECG monitoring when magnet is used or reprogramming is to be performed.  Procedure may interfere with device function.  Magnet should be placed over device during procedure.  Per Device Clinic Standing Orders, Vergie Living [place], [place]  10:14 AM 10/08/2021

## 2021-10-08 NOTE — Progress Notes (Signed)
Spoke w/ via phone for pre-op interview---pt Lab needs dos---- ISTAT              Lab results------10/02/21 EKG in chart & [place] test -----patient states asymptomatic no test needed Arrive at -------0730 on 10/14/21 NPO after MN NO Solid Food.  Clear liquids from MN until---0630 Med rec completed Medications to take morning of surgery -----none Diabetic medication -----Hold metformin & Januvia morning of surgery Patient instructed no nail polish to be worn day of surgery Patient instructed to bring photo id and insurance card day of surgery Patient aware to have Driver (ride ) / caregiver    for 24 hours after surgery - friend [name] Patient Special Instructions -----Hold warfarin 5 days before surgery per 10/02/21 cardiology note Baldwin Jamaica, PA in Epic & chart. Last dose of warfarin before surgery 10/08/21, patient aware. Pre-Op special Istructions -----Pt is HOH, wears hearing aids. Patient verbalized understanding of instructions that were given at this phone interview. Patient denies shortness of breath, chest pain, fever, cough at this phone interview.   Patient has a history of HTN, CKD stage 3a, permanent AFIB, CHB w/pacemaker, obesity, DM2, and hx of cardiomyopathy with recovered LVEF by last echo on 11/15/15 in [place]. Cardiologist: Dr. Thompson Grayer LOV 10/02/21 with Baldwin Jamaica, PA. Cardiac clearance and instructions given to stop warfarin 5 days prior to surgery in cardiology OV note 10/02/21.  Pacemaker device instructions  received on 10/08/21 in chart & Epic.

## 2021-10-13 NOTE — Anesthesia Preprocedure Evaluation (Addendum)
Anesthesia Evaluation  Patient identified by MRN, date of birth, ID band Patient awake    Reviewed: Allergy & Precautions, H&P , NPO status , Patient's Chart, lab work & pertinent test results, reviewed documented beta blocker date and time   Airway Mallampati: III  TM Distance: >3 FB Neck ROM: Full    Dental  (+) Edentulous Upper, Dental Advisory Given, Poor Dentition, Missing,    Pulmonary shortness of breath (chronic, no home O2), with exertion and at rest,    Pulmonary exam normal breath sounds clear to auscultation       Cardiovascular Exercise Tolerance: Good hypertension, Pt. on medications and Pt. on home beta blockers Normal cardiovascular exam+ dysrhythmias (coumadin- LD 1wk ago) Atrial Fibrillation + pacemaker (CHB s/p PPM 2010, changed out 2020)  Rhythm:Regular Rate:Normal  Last echo 2016: Left ventricle: The cavity size was normal. Wall thickness was  normal. Systolic function was normal. The estimated ejection  fraction was in the range of 50% to 55%.  - Left atrium: The atrium was mildly dilated.  - Atrial septum: No defect or patent foramen ovale was identified.    Neuro/Psych PSYCHIATRIC DISORDERS Depression negative neurological ROS     GI/Hepatic negative GI ROS, Neg liver ROS,   Endo/Other  diabetes, Well Controlled, Type 2, Oral Hypoglycemic AgentsMorbid obesitya1c 5.8 Obesity BMI 37  Renal/GU Renal InsufficiencyRenal diseaseCr 1.5 L renal pelvic mass  negative genitourinary   Musculoskeletal  (+) Arthritis , Osteoarthritis,    Abdominal (+) + obese,   Peds  Hematology negative hematology ROS (+) hct 36   Anesthesia Other Findings Chronic LE edema   Reproductive/Obstetrics negative OB ROS                            Anesthesia Physical Anesthesia Plan  ASA: 3  Anesthesia Plan: General   Post-op Pain Management: Tylenol PO (pre-op)   Induction:  Intravenous  PONV Risk Score and Plan: 3 and Ondansetron, Dexamethasone and Treatment may vary due to age or medical condition  Airway Management Planned: LMA  Additional Equipment: None  Intra-op Plan:   Post-operative Plan: Extubation in OR  Informed Consent: I have reviewed the patients History and Physical, chart, labs and discussed the procedure including the risks, benefits and alternatives for the proposed anesthesia with the patient or authorized representative who has indicated his/her understanding and acceptance.     Dental advisory given  Plan Discussed with: CRNA  Anesthesia Plan Comments:        Anesthesia Quick Evaluation

## 2021-10-14 ENCOUNTER — Encounter (HOSPITAL_BASED_OUTPATIENT_CLINIC_OR_DEPARTMENT_OTHER): Payer: Self-pay | Admitting: Urology

## 2021-10-14 ENCOUNTER — Other Ambulatory Visit: Payer: Self-pay

## 2021-10-14 ENCOUNTER — Ambulatory Visit (HOSPITAL_BASED_OUTPATIENT_CLINIC_OR_DEPARTMENT_OTHER)
Admission: RE | Admit: 2021-10-14 | Discharge: 2021-10-14 | Disposition: A | Discharge status: Home / Self Care | Payer: Medicare Other | Attending: Urology | Admitting: Urology

## 2021-10-14 ENCOUNTER — Ambulatory Visit (HOSPITAL_BASED_OUTPATIENT_CLINIC_OR_DEPARTMENT_OTHER): Payer: Medicare Other | Admitting: Certified Registered"

## 2021-10-14 ENCOUNTER — Encounter (HOSPITAL_BASED_OUTPATIENT_CLINIC_OR_DEPARTMENT_OTHER)
Admission: RE | Disposition: A | Discharge status: Home / Self Care | Payer: Self-pay | Source: Home / Self Care | Attending: Urology

## 2021-10-14 DIAGNOSIS — C652 Malignant neoplasm of left renal pelvis: Secondary | ICD-10-CM | POA: Diagnosis not present

## 2021-10-14 DIAGNOSIS — R310 Gross hematuria: Secondary | ICD-10-CM | POA: Diagnosis not present

## 2021-10-14 DIAGNOSIS — E1122 Type 2 diabetes mellitus with diabetic chronic kidney disease: Secondary | ICD-10-CM | POA: Insufficient documentation

## 2021-10-14 DIAGNOSIS — Z8546 Personal history of malignant neoplasm of prostate: Secondary | ICD-10-CM | POA: Diagnosis not present

## 2021-10-14 DIAGNOSIS — N183 Chronic kidney disease, stage 3 unspecified: Secondary | ICD-10-CM | POA: Insufficient documentation

## 2021-10-14 DIAGNOSIS — I129 Hypertensive chronic kidney disease with stage 1 through stage 4 chronic kidney disease, or unspecified chronic kidney disease: Secondary | ICD-10-CM | POA: Diagnosis not present

## 2021-10-14 HISTORY — DX: Atrioventricular block, complete: I44.2

## 2021-10-14 HISTORY — DX: Presence of spectacles and contact lenses: Z97.3

## 2021-10-14 HISTORY — DX: Other primary thrombophilia: D68.59

## 2021-10-14 HISTORY — PX: CYSTOSCOPY WITH RETROGRADE PYELOGRAM, URETEROSCOPY AND STENT PLACEMENT: SHX5789

## 2021-10-14 HISTORY — DX: Presence of dental prosthetic device (complete) (partial): Z97.2

## 2021-10-14 HISTORY — DX: Chronic kidney disease, stage 3 unspecified: N18.30

## 2021-10-14 HISTORY — DX: Unspecified hearing loss, unspecified ear: H91.90

## 2021-10-14 LAB — POCT I-STAT, CHEM 8
BUN: 36 mg/dL — ABNORMAL HIGH (ref 8–23)
Calcium, Ion: 0.93 mmol/L — ABNORMAL LOW (ref 1.15–1.40)
Chloride: 106 mmol/L (ref 98–111)
Creatinine, Ser: 1.5 mg/dL — ABNORMAL HIGH (ref 0.61–1.24)
Glucose, Bld: 120 mg/dL — ABNORMAL HIGH (ref 70–99)
HCT: 36 % — ABNORMAL LOW (ref 39.0–52.0)
Hemoglobin: 12.2 g/dL — ABNORMAL LOW (ref 13.0–17.0)
Potassium: 3.9 mmol/L (ref 3.5–5.1)
Sodium: 139 mmol/L (ref 135–145)
TCO2: 23 mmol/L (ref 22–32)

## 2021-10-14 LAB — GLUCOSE, CAPILLARY: Glucose-Capillary: 119 mg/dL — ABNORMAL HIGH (ref 70–99)

## 2021-10-14 SURGERY — CYSTOURETEROSCOPY, WITH RETROGRADE PYELOGRAM AND STENT INSERTION
Anesthesia: General | Site: Abdomen | Laterality: Left

## 2021-10-14 MED ORDER — PHENYLEPHRINE HCL (PRESSORS) 10 MG/ML IV SOLN
INTRAVENOUS | Status: DC | PRN
  Administered 2021-10-14: 120 ug via INTRAVENOUS
  Administered 2021-10-14 (×2): 80 ug via INTRAVENOUS
  Administered 2021-10-14: 40 ug via INTRAVENOUS
  Administered 2021-10-14: 80 ug via INTRAVENOUS
  Administered 2021-10-14: 120 ug via INTRAVENOUS

## 2021-10-14 MED ORDER — FENTANYL CITRATE (PF) 100 MCG/2ML IJ SOLN: 25.0000 ug | INTRAMUSCULAR | Status: DC | PRN

## 2021-10-14 MED ORDER — EPHEDRINE 5 MG/ML INJ
INTRAVENOUS | Status: AC
  Filled 2021-10-14: qty 5

## 2021-10-14 MED ORDER — ACETAMINOPHEN 500 MG PO TABS
ORAL_TABLET | ORAL | Status: AC
  Filled 2021-10-14: qty 2

## 2021-10-14 MED ORDER — FENTANYL CITRATE (PF) 100 MCG/2ML IJ SOLN
INTRAMUSCULAR | Status: DC | PRN
  Administered 2021-10-14: 50 ug via INTRAVENOUS

## 2021-10-14 MED ORDER — IOHEXOL 300 MG/ML  SOLN
INTRAMUSCULAR | Status: DC | PRN
  Administered 2021-10-14: 10:00:00 10 mL

## 2021-10-14 MED ORDER — DEXAMETHASONE SODIUM PHOSPHATE 4 MG/ML IJ SOLN
INTRAMUSCULAR | Status: DC | PRN
  Administered 2021-10-14: 4 mg via INTRAVENOUS

## 2021-10-14 MED ORDER — EPHEDRINE SULFATE 50 MG/ML IJ SOLN
INTRAMUSCULAR | Status: DC | PRN
  Administered 2021-10-14 (×4): 5 mg via INTRAVENOUS

## 2021-10-14 MED ORDER — SODIUM CHLORIDE 0.9 % IV SOLN: INTRAVENOUS | Status: DC

## 2021-10-14 MED ORDER — LIDOCAINE HCL (CARDIAC) PF 100 MG/5ML IV SOSY
PREFILLED_SYRINGE | INTRAVENOUS | Status: DC | PRN
  Administered 2021-10-14: 60 mg via INTRAVENOUS

## 2021-10-14 MED ORDER — SODIUM CHLORIDE 0.9 % IR SOLN
Status: DC | PRN
  Administered 2021-10-14: 3000 mL

## 2021-10-14 MED ORDER — CEFAZOLIN SODIUM-DEXTROSE 2-4 GM/100ML-% IV SOLN
INTRAVENOUS | Status: AC
  Filled 2021-10-14: qty 100

## 2021-10-14 MED ORDER — PROPOFOL 10 MG/ML IV BOLUS
INTRAVENOUS | Status: DC | PRN
  Administered 2021-10-14: 100 mg via INTRAVENOUS

## 2021-10-14 MED ORDER — ACETAMINOPHEN 500 MG PO TABS
1000.0000 mg | ORAL_TABLET | Freq: Once | ORAL | Status: AC
  Administered 2021-10-14: 07:00:00 1000 mg via ORAL

## 2021-10-14 MED ORDER — PHENYLEPHRINE 40 MCG/ML (10ML) SYRINGE FOR IV PUSH (FOR BLOOD PRESSURE SUPPORT)
PREFILLED_SYRINGE | INTRAVENOUS | Status: AC
  Filled 2021-10-14: qty 20

## 2021-10-14 MED ORDER — FENTANYL CITRATE (PF) 100 MCG/2ML IJ SOLN
INTRAMUSCULAR | Status: AC
  Filled 2021-10-14: qty 2

## 2021-10-14 MED ORDER — PROPOFOL 10 MG/ML IV BOLUS
INTRAVENOUS | Status: AC
  Filled 2021-10-14: qty 40

## 2021-10-14 MED ORDER — ONDANSETRON HCL 4 MG/2ML IJ SOLN
INTRAMUSCULAR | Status: DC | PRN
  Administered 2021-10-14: 4 mg via INTRAVENOUS

## 2021-10-14 MED ORDER — CEFAZOLIN SODIUM-DEXTROSE 2-4 GM/100ML-% IV SOLN
2.0000 g | INTRAVENOUS | Status: AC
  Administered 2021-10-14: 10:00:00 2 g via INTRAVENOUS

## 2021-10-14 SURGICAL SUPPLY — 30 items
BAG DRAIN URO-CYSTO SKYTR STRL (DRAIN) ×2 IMPLANT
BAG DRN UROCATH (DRAIN) ×1
BASKET ZERO TIP NITINOL 2.4FR (BASKET) ×2 IMPLANT
BSKT STON RTRVL ZERO TP 2.4FR (BASKET) ×1
CATH INTERMIT  6FR 70CM (CATHETERS) ×2 IMPLANT
CLOTH BEACON ORANGE TIMEOUT ST (SAFETY) ×2 IMPLANT
COVER DOME SNAP 22 D (MISCELLANEOUS) ×2 IMPLANT
DRSG TEGADERM 2-3/8X2-3/4 SM (GAUZE/BANDAGES/DRESSINGS) ×2 IMPLANT
ELECT REM PT RETURN 9FT ADLT (ELECTROSURGICAL)
ELECTRODE REM PT RTRN 9FT ADLT (ELECTROSURGICAL) IMPLANT
FIBER LASER FLEXIVA 365 (UROLOGICAL SUPPLIES) IMPLANT
GLOVE SURG ENC MOIS LTX SZ8 (GLOVE) ×2 IMPLANT
GLOVE SURG UNDER POLY LF SZ7.5 (GLOVE) ×2 IMPLANT
GOWN STRL REUS W/TWL LRG LVL3 (GOWN DISPOSABLE) ×2 IMPLANT
GUIDEWIRE ANG ZIPWIRE 038X150 (WIRE) IMPLANT
GUIDEWIRE STR DUAL SENSOR (WIRE) ×2 IMPLANT
IV NS IRRIG 3000ML ARTHROMATIC (IV SOLUTION) ×2 IMPLANT
KIT TURNOVER CYSTO (KITS) ×2 IMPLANT
MANIFOLD NEPTUNE II (INSTRUMENTS) ×2 IMPLANT
NDL SAFETY ECLIPSE 18X1.5 (NEEDLE) ×1 IMPLANT
NEEDLE HYPO 18GX1.5 SHARP (NEEDLE) ×2
NS IRRIG 500ML POUR BTL (IV SOLUTION) ×2 IMPLANT
PACK CYSTO (CUSTOM PROCEDURE TRAY) ×2 IMPLANT
SHEATH URETERAL 12FRX35CM (MISCELLANEOUS) IMPLANT
STENT URET 6FRX28 CONTOUR (STENTS) ×2 IMPLANT
SYR 20ML LL LF (SYRINGE) ×2 IMPLANT
TRACTIP FLEXIVA PULS ID 200XHI (Laser) IMPLANT
TRACTIP FLEXIVA PULSE ID 200 (Laser)
TUBE CONNECTING 12X1/4 (SUCTIONS) IMPLANT
TUBING UROLOGY SET (TUBING) IMPLANT

## 2021-10-14 NOTE — Anesthesia Postprocedure Evaluation (Signed)
Anesthesia Post Note  Patient: Naim Murtha Chiao  Procedure(s) Performed: CYSTOSCOPY WITH RETROGRADE PYELOGRAM, URETEROSCOPY WITH  BIOPSY  AND STENT PLACEMENT (Left: Abdomen)     Patient location during evaluation: PACU Anesthesia Type: General Level of consciousness: awake and alert, oriented and patient cooperative Pain management: pain level controlled Vital Signs Assessment: post-procedure vital signs reviewed and stable Respiratory status: spontaneous breathing, nonlabored ventilation and respiratory function stable Cardiovascular status: blood pressure returned to baseline and stable Postop Assessment: no apparent nausea or vomiting Anesthetic complications: no   No notable events documented.  Last Vitals:  Vitals:   10/14/21 1030 10/14/21 1045  BP: (!) 110/58 111/61  Pulse: 71 70  Resp: 12 12  Temp:    SpO2: 96% 94%    Last Pain:  Vitals:   10/14/21 1045  TempSrc:   PainSc: 0-No pain                 Pervis Hocking

## 2021-10-14 NOTE — Discharge Instructions (Addendum)
You may see some blood in the urine and may have some burning with urination for 48-72 hours. You also may notice that you have to urinate more frequently or urgently after your procedure which is normal.  You should call should you develop an inability urinate, fever > 101, persistent nausea and vomiting that prevents you from eating or drinking to stay hydrated.  If you have a stent, you will likely urinate more frequently and urgently until the stent is removed and you may experience some discomfort/pain in the lower abdomen and flank especially when urinating. You may take pain medication prescribed to you if needed for pain. You may also intermittently have blood in the urine until the stent is removed.  It is okay to pull the string to remove the stent on Thursday morning. It is okay to start back on the Coumadin when your urine clears  [facility] [phone] Post Ureteroscopy With or Without Stent Instructions  Definitions:  Ureter: The duct that transports urine from the kidney to the bladder. Stent:   A plastic hollow tube that is placed into the ureter, from the kidney to the bladder to prevent the ureter from swelling shut.  GENERAL INSTRUCTIONS:  Despite the fact that no skin incisions were used, the area around the ureter and bladder is raw and irritated. The stent is a foreign body which will further irritate the bladder wall. This irritation is manifested by increased frequency of urination, both day and night, and by an increase in the urge to urinate. In some, the urge to urinate is present almost always. Sometimes the urge is strong enough that you may not be able to stop yourself from urinating. The only real cure is to remove the stent and then give time for the bladder wall to heal which can't be done until the danger of the ureter swelling shut has passed, which varies.  You may see some blood in your urine while the stent is in place and a few days  afterwards. Do not be alarmed, even if the urine was clear for a while. Get off your feet and drink lots of fluids until clearing occurs. If you start to pass clots or don't improve, call us.  DIET: You may return to your normal diet immediately. Because of the raw surface of your bladder, alcohol, spicy foods, acid type foods and drinks with caffeine may cause irritation or frequency and should be used in moderation. To keep your urine flowing freely and to avoid constipation, drink plenty of fluids during the day ( 8-10 glasses ). Tip: Avoid cranberry juice because it is very acidic.  ACTIVITY: Your physical activity doesn't need to be restricted. However, if you are very active, you may see some blood in your urine. We suggest that you reduce your activity under these circumstances until the bleeding has stopped.  BOWELS: It is important to keep your bowels regular during the postoperative period. Straining with bowel movements can cause bleeding. A bowel movement every other day is reasonable. Use a mild laxative if needed, such as Milk of Magnesia 2-3 tablespoons, or 2 Dulcolax tablets. Call if you continue to have problems. If you have been taking narcotics for pain, before, during or after your surgery, you may be constipated. Take a laxative if necessary.   MEDICATION: You should resume your pre-surgery medications unless told not to. In addition you will often be given an antibiotic to prevent infection. These should be taken as prescribed until the  bottles are finished unless you are having an unusual reaction to one of the drugs.  PROBLEMS YOU SHOULD REPORT TO [place]: Fevers over 100.5 Fahrenheit. Heavy bleeding, or clots ( See above notes about blood in urine ). Inability to urinate. Drug reactions ( hives, rash, nausea, vomiting, diarrhea ). Severe burning or pain with urination that is not improving.  FOLLOW-UP: You will need a follow-up appointment to monitor your progress. Call  for this appointment at the number listed above. Usually the first appointment will be about three to fourteen days after your surgery.   No Tylenol/acetaminophen until after 1:30 pm today if needed.   Post Anesthesia Home Care Instructions  Activity: Get plenty of rest for the remainder of the day. A responsible individual must stay with you for 24 hours following the procedure.  For the next 24 hours, DO NOT: -Drive a car -Paediatric nurse -Drink alcoholic beverages -Take any medication unless instructed by your physician -Make any legal decisions or sign important papers.  Meals: Start with liquid foods such as gelatin or soup. Progress to regular foods as tolerated. Avoid greasy, spicy, heavy foods. If nausea and/or vomiting occur, drink only clear liquids until the nausea and/or vomiting subsides. Call your physician if vomiting continues.  Special Instructions/Symptoms: Your throat may feel dry or sore from the anesthesia or the breathing tube placed in your throat during surgery. If this causes discomfort, gargle with warm salt water. The discomfort should disappear within 24 hours.

## 2021-10-14 NOTE — Anesthesia Procedure Notes (Signed)
Procedure Name: LMA Insertion Date/Time: 10/14/2021 9:41 AM Performed by: Georgeanne Nim, CRNA Pre-anesthesia Checklist: Patient identified, Emergency Drugs available, Suction available, Patient being monitored and Timeout performed Patient Re-evaluated:Patient Re-evaluated prior to induction Oxygen Delivery Method: Circle system utilized Preoxygenation: Pre-oxygenation with 100% oxygen Induction Type: IV induction Ventilation: Mask ventilation without difficulty LMA: LMA inserted LMA Size: 5.0 Number of attempts: 1 Placement Confirmation: positive ETCO2, CO2 detector and breath sounds checked- equal and bilateral Tube secured with: Tape Dental Injury: Teeth and Oropharynx as per pre-operative assessment

## 2021-10-14 NOTE — Interval H&P Note (Signed)
History and Physical Interval Note:  10/14/2021 9:26 AM  [name]  has presented today for surgery, with the diagnosis of LEFT RENAL PELVIC MASS.  The various methods of treatment have been discussed with the patient and family. After consideration of risks, benefits and other options for treatment, the patient has consented to  Procedure(s): CYSTOSCOPY WITH RETROGRADE PYELOGRAM, URETEROSCOPY WITH POSSIBLE BIOPSY  AND STENT PLACEMENT (Left) as a surgical intervention.  The patient's history has been reviewed, patient examined, no change in status, stable for surgery.  I have reviewed the patient's chart and labs.  Questions were answered to the patient's satisfaction.     Lillette Boxer Laniqua Torrens

## 2021-10-14 NOTE — H&P (Addendum)
H&P  Chief Complaint: Blood in urine  History of Present Illness: 80 year old male who is years out from brachytherapy for prostate cancer presents at this time for further evaluation of a left renal pelvic mass.  He recently presented with gross painless hematuria.  CT hematuria protocol revealed the following:  1. There is an expansile, contrast-enhancing mass within the left  renal collecting system with faint peripheral calcifications,  measuring 4.9 x 3.9 x 4.8 cm. 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  2. No evidence of lymphadenopathy or metastatic disease in the  abdomen or pelvis.  3. Punctuate nonobstructive calculus of the inferior pole of the  right kidney.  4. Prostate brachytherapy.   I have discussed further evaluation with the patient, specifically cystoscopy, left retrograde ureteropyelogram, left ureteroscopy and possible biopsy of this renal pelvic mass.  He understands procedure, risks, complications and expected outcome.  He also understands that there will be a stent placed postoperatively.  Past Medical History:  Diagnosis Date   Arthritis    CHB (complete heart block) ([place])    patient has permanent pacemaker   Chronic anticoagulation    CKD (chronic kidney disease), stage III ([place])    per hx in OV note from Dr. Shon Baton, 08/06/21   Depression    Diabetes mellitus without complication ([place])    Dyspnea    Gout    History of echocardiogram    Echo 9/16:  EF 55-60%, no RWMA, mild MR, mild LAE, trivial PI   History of kidney stones    HOH (hard of hearing)    wears hearing aids   HTN (hypertension)    LV dysfunction    EF down to 30% per echo Feb 2013 -  started on ARB and low dose beta blocker   Morbid obesity ([place])    Nephrolithiasis    Occasional tremors 10/08/2021   Pt states that he has tremors in his hands, and that his doctor told him it was from old age.   Osteoarthritis    Pacemaker 11/17/2008   Permanent atrial  fibrillation ([place])    failed cardioversion in April of 2010   Prostate CA [facility])    Thrombophilia [facility])    Urosepsis    Wears glasses    reading only   Wears partial dentures     Past Surgical History:  Procedure Laterality Date   CARDIOVERSION  02/15/2009   failed   EP IMPLANTABLE DEVICE N/A 08/17/2015   Procedure: PPM Generator Changeout;  Surgeon: Thompson Grayer, MD;  Location: [place] [facility];  Service: Cardiovascular;  Laterality: N/A;   PACEMAKER PLACEMENT  11/17/2008   [employer]   RADIOACTIVE SEED IMPLANT     1990's ?   SHOULDER SURGERY Left    pt unsure of date   TOTAL KNEE ARTHROPLASTY Left    many years ago per pat   TOTAL KNEE ARTHROPLASTY Right 05/14/2021   Procedure: TOTAL KNEE ARTHROPLASTY;  Surgeon: Paralee Cancel, MD;  Location: [facility];  Service: Orthopedics;  Laterality: Right;   WISDOM TOOTH EXTRACTION      Home Medications:    Allergies:  Allergies  Allergen Reactions   Codeine     "Crazy reaction" per Dr. Jenny Reichmann Russo's history 08/06/21   Oxycodone Other (See Comments)    Makes feel funny   Sertraline Hcl Other (See Comments)    Makes feel funny    Family History  Problem Relation Age of Onset   Heart attack Father 28  Healthy Mother    Healthy Brother     Social History:  reports that he has never smoked. He has never used smokeless tobacco. He reports that he does not drink alcohol and does not use drugs.  ROS: A complete review of systems was performed.  All systems are negative except for pertinent findings as noted.  Physical Exam:  Vital signs in last 24 hours: BP 119/70   Pulse 81   Temp 98.1 F (36.7 C) (Oral)   Resp (!) 22   Ht 6' (1.829 m)   Wt 123.5 kg   SpO2 96%   BMI 36.93 kg/m  Constitutional:  Alert and oriented, No acute distress Cardiovascular: Regular rate  Respiratory: Normal respiratory effort  Neurologic: Grossly intact, no focal deficits Psychiatric: Normal mood and affect  I have reviewed prior pt  notes  I have reviewed notes from referring/previous physicians  I have reviewed urinalysis results  I have independently reviewed prior imaging  I have reviewed prior PSA results    Impression/Assessment:  Left renal pelvic mass, 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

## 2021-10-14 NOTE — Transfer of Care (Signed)
Immediate Anesthesia Transfer of Care Note  Patient: [name]  Procedure(s) Performed: CYSTOSCOPY WITH RETROGRADE PYELOGRAM, URETEROSCOPY WITH  BIOPSY  AND STENT PLACEMENT (Left: Abdomen)  Patient Location: PACU  Anesthesia Type:General  Level of Consciousness: awake, alert , oriented and patient cooperative  Airway & Oxygen Therapy: Patient Spontanous Breathing  Post-op Assessment: Report given to RN and Post -op Vital signs reviewed and stable  Post vital signs: Reviewed and stable  Last Vitals:  Vitals Value Taken Time  BP    Temp    Pulse 73 10/14/21 1025  Resp    SpO2 95 % 10/14/21 1025  Vitals shown include unvalidated device data.  Last Pain:  Vitals:   10/14/21 0752  TempSrc:   PainSc: 0-No pain      Patients Stated Pain Goal: 4 (16/10/96 0454)  Complications: No notable events documented.

## 2021-10-14 NOTE — Op Note (Signed)
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  He recently presented with gross painless hematuria.  CT hematuria protocol of the abdomen and pelvis revealed no extrarenal abnormalities, with a large filling defect of the left renal pelvis.  Bladder appeared normal although it was decompressed.  He presents at this time for further diagnostic study of the left renal unit, specifically cystoscopy, left retrograde ureteropyelogram, ureteroscopy with biopsy and then stent placement.  I discussed the procedure with the patient, expected outcomes as well as risks involved with include blood in the urine, possible infection, needing a stent, stent discomfort, anesthetic complications, among others.  He understands these and desires to proceed.  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  Prostate was nonobstructive.  Thorough inspection of the bladder was performed revealing no urothelial abnormalities.  Ureteral orifices were normal in their location and configuration.  Retrograde study of the left ureter and renal pelvis was performed utilizing a 6 [place] open-ended catheter and Omnipaque.  This revealed a normal ureter throughout.  There was a large filling defect in the renal pelvis.  The lower calyces were somewhat  dilated but otherwise normal.  Direct inspection of the left renal pelvis was performed and this revealed papillary lesions consistent with urothelial carcinoma.  Description of procedure: The patient was properly identified and marked in the holding area.  He received preoperative IV antibiotics.  Was taken to the operating room where general anesthetic was administered with the LMA.  He was placed in the dorsolithotomy position.  Genitalia and perineum were prepped, draped, proper timeout performed.  21 French panendoscope advanced under direct vision into the bladder.  Small stricture seen in the bulbous urethra which was easily traversed.  Thorough inspection of the bladder was performed with the above-mentioned findings.  Utilizing a 6 [place] open-ended catheter Omnipaque was utilized to perform retrograde study of the left ureter and renal pelvis.  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  Following this, sensor tip guidewire advanced through the open-ended catheter, easily into the upper pole calyceal system using fluoroscopic guidance.  Open-ended catheter and cystoscope were removed.  Sequential dilation of the ureter first performed with the obturator of the 11/13 long ureteral access catheter.  Following this, the entire access catheter was placed.  The guidewire and obturator were then removed.  Flexible dual-lumen digital ureteroscope was then passed.  Easily advanced into the renal pelvis where whitish papillary tissue was seen.  Quite a few biopsies were taken using a 0 tip nitinol basket.  Approximately 10 passes were made with the basket with a fairly large amount of biopsy specimen taken.  There was a mild amount of bleeding after this.  I then removed the ureteroscope, repositioned the guidewire and removed the access catheter.  The guidewire was backloaded through the scope, and then placed a 28 cm x 6 French contour double-J stent with a tether left in  place.  Once adequate position was seen, it was deployed by removing the guidewire with excellent proximal and distal curl seen.  Mild amount of blood seen from the ureteral orifice at this point, I felt that this was a small/expected amount, however.  The bladder was drained.  The scope was removed.  I brought the tether through the penis, tied it in a knot outside the meatus, trimmed it and it was taped to his penis.  At this point, the procedure was terminated.  The patient was awakened, taken to the PACU in stable condition having tolerated the procedure well.

## 2021-10-15 ENCOUNTER — Encounter (HOSPITAL_BASED_OUTPATIENT_CLINIC_OR_DEPARTMENT_OTHER): Payer: Self-pay | Admitting: Urology

## 2021-10-15 LAB — SURGICAL PATHOLOGY

## 2021-11-13 ENCOUNTER — Telehealth: Payer: Self-pay | Admitting: Internal Medicine

## 2021-11-13 ENCOUNTER — Other Ambulatory Visit: Payer: Self-pay | Admitting: Urology

## 2021-11-13 NOTE — Telephone Encounter (Signed)
° °  Pre-operative Risk Assessment    Patient Name: [name]  DOB: [birth year] MRN: [id number]      Request for Surgical Clearance    Procedure:  Removal of L kidney  Date of Surgery:  Clearance 12/25/21                                 Surgeon:  Dr. Rip Harbour Group or Practice Name:  [facility] Phone number:  [phone] Fax number:  [phone]    Type of Clearance Requested:   - Medical  - Pharmacy:  Hold Warfarin (Coumadin) 5 days   Type of Anesthesia:  General    Additional requests/questions:    Rosalyn Gess   11/13/2021, 3:51 PM

## 2021-11-14 NOTE — Telephone Encounter (Signed)
° °  Primary Cardiologist: EP-[name]  Chart reviewed as part of pre-operative protocol coverage. Given past medical history and time since last visit, based on ACC/AHA guidelines, [name] would be at acceptable risk for the planned procedure without further cardiovascular testing.   RCRI indicates Class II risk, 0.9% risk of major cardiac event. Patient reports METS >4.   Patient was advised that if he develops new symptoms prior to surgery to contact our office to arrange a follow-up appointment.  He verbalized understanding.  Patient with diagnosis of A Fib on Warfarin for anticoagulation.     Procedure: Removal of L kidney Date of procedure: 12/25/21    Primary care provider, Dr Shon Baton, manages patient's warfarin/INR. Authorization for holding warfarin should be routed to Dr Keane Police office.  I will route this recommendation to the requesting party via Epic fax function and remove from pre-op pool.  Please call with questions.  Lenna Sciara, NP 11/14/2021, 7:57 AM

## 2021-11-14 NOTE — Telephone Encounter (Signed)
Patient with diagnosis of A Fib on Warfarin for anticoagulation.    Procedure: Removal of L kidney Date of procedure: 12/25/21   Primary care provider, Dr Shon Baton, manages patient's warfarin/INR.  Authorization for holding warfarin should be routed to Dr Keane Police office

## 2021-12-06 ENCOUNTER — Ambulatory Visit (INDEPENDENT_AMBULATORY_CARE_PROVIDER_SITE_OTHER): Payer: Medicare Other

## 2021-12-06 DIAGNOSIS — I442 Atrioventricular block, complete: Secondary | ICD-10-CM | POA: Diagnosis not present

## 2021-12-06 LAB — CUP PACEART REMOTE DEVICE CHECK
Battery Impedance: 529 Ohm
Battery Remaining Longevity: 83 mo
Battery Voltage: 2.78 V
Brady Statistic RV Percent Paced: 100 %
Date Time Interrogation Session: 20230120121153
Implantable Lead Implant Date: 20100125
Implantable Lead Implant Date: 20100125
Implantable Lead Location: 753859
Implantable Lead Location: 753860
Implantable Lead Model: 4470
Implantable Lead Model: 4471
Implantable Lead Serial Number: 475977
Implantable Lead Serial Number: 639547
Implantable Pulse Generator Implant Date: 20160930
Lead Channel Impedance Value: 435 Ohm
Lead Channel Impedance Value: 67 Ohm
Lead Channel Pacing Threshold Amplitude: 1.125 V
Lead Channel Pacing Threshold Pulse Width: 0.4 ms
Lead Channel Setting Pacing Amplitude: 2.5 V
Lead Channel Setting Pacing Pulse Width: 0.4 ms
Lead Channel Setting Sensing Sensitivity: 4 mV

## 2021-12-17 NOTE — Patient Instructions (Signed)
DUE TO COVID-19 ONLY ONE VISITOR IS ALLOWED TO COME WITH YOU AND STAY IN THE WAITING ROOM ONLY DURING PRE OP AND PROCEDURE.   **NO VISITORS ARE ALLOWED IN THE SHORT STAY AREA OR RECOVERY ROOM!!**  IF YOU WILL BE ADMITTED INTO THE HOSPITAL YOU ARE ALLOWED ONLY TWO SUPPORT PEOPLE DURING VISITATION HOURS ONLY (7 AM -8PM)   The support person(s) must pass our screening, gel in and out, and wear a mask at all times, including in the patients room. Patients must also wear a mask when staff or their support person are in the room. Visitors GUEST BADGE MUST BE WORN VISIBLY  One adult visitor may remain with you overnight and MUST be in the room by 8 P.M.  No visitors under the age of 41. Any visitor under the age of 2 must be accompanied by an adult.    COVID SWAB TESTING MUST BE COMPLETED ON: 12/23/21    Site: [facility] [place] [place] Enter: Main Entrance have a seat in the waiting area to the right of main entrance (DO NOT [place]!!!!!) Dial: [phone] to alert staff you have arrived  You are not required to quarantine, however you are required to wear a well-fitted mask when you are out and around people not in your household.  Hand Hygiene often Do NOT share personal items Notify your provider if you are in close contact with someone who has COVID or you develop fever 100.4 or greater, new onset of sneezing, cough, sore throat, shortness of breath or body aches.  [place], [address], must go inside of the hospital, NOT A DRIVE THRU!  (Must self quarantine after testing. Follow instructions on handout.)       Your procedure is scheduled on: 12/25/21   Report to [facility] Main Entrance    Report to admitting at : 9:15 AM   Call this number if you have problems the morning of surgery [phone]   CLEAR LIQUID DIET: Starting the day before surgery until: 9:00  AM.  Foods Allowed                                                                     Foods Excluded  Water, Black Coffee and tea, regular and decaf                             liquids that you cannot  Plain Jell-O in any flavor  (No red)                                           see through such as: Fruit ices (not with fruit pulp)                                     milk, soups, orange juice              Iced Popsicles (No red)  All solid food                                   Apple juices Sports drinks like Gatorade (No red) Lightly seasoned clear broth or consume(fat free) Sugar Sample Menu Breakfast                                Lunch                                     Supper Cranberry juice                    Beef broth                            Chicken broth Jell-O                                     Grape juice                           Apple juice Coffee or tea                        Jell-O                                      Popsicle                                                Coffee or tea                        Coffee or tea   FOLLOW BOWEL PREP INSTRUCTIONS YOU RECEIVED FROM YOUR SURGEON'S OFFICE!!!   Oral Hygiene is also important to reduce your risk of infection.                                    Remember - BRUSH YOUR TEETH THE MORNING OF SURGERY WITH YOUR REGULAR TOOTHPASTE   Do NOT smoke after Midnight   Take these medicines the morning of surgery with A SIP OF WATER: carvedilol,allopurinol.  DO NOT TAKE ANY ORAL DIABETIC MEDICATIONS DAY OF YOUR SURGERY                              You may not have any metal on your body including hair pins, jewelry, and body piercing             Do not wear lotions, powders, perfumes/cologne, or deodorant              Men may shave face and neck.   Do not bring valuables to the hospital. [facility] IS NOT  RESPONSIBLE   FOR VALUABLES.   Contacts, dentures or bridgework may  not be worn into surgery.   Bring small overnight bag day of surgery.    Patients discharged on the day of surgery will not be allowed to drive home.  Someone needs to stay with you for the first 24 hours after anesthesia.   Special Instructions: Bring a copy of your healthcare power of attorney and living will documents         the day of surgery if you haven't scanned them before.              Please read over the following fact sheets you were given: IF YOU HAVE QUESTIONS ABOUT YOUR PRE-OP INSTRUCTIONS PLEASE CALL [phone]     [facility] - Preparing for Surgery Before surgery, you can play an important role.  Because skin is not sterile, your skin needs to be as free of germs as possible.  You can reduce the number of germs on your skin by washing with CHG (chlorahexidine gluconate) soap before surgery.  CHG is an antiseptic cleaner which kills germs and bonds with the skin to continue killing germs even after washing. Please DO NOT use if you have an allergy to CHG or antibacterial soaps.  If your skin becomes reddened/irritated stop using the CHG and inform your nurse when you arrive at Short Stay. Do not shave (including legs and underarms) for at least 48 hours prior to the first CHG shower.  You may shave your face/neck. Please follow these instructions carefully:  1.  Shower with CHG Soap the night before surgery and the  morning of Surgery.  2.  If you choose to wash your hair, wash your hair first as usual with your  normal  shampoo.  3.  After you shampoo, rinse your hair and body thoroughly to remove the  shampoo.                           4.  Use CHG as you would any other liquid soap.  You can apply chg directly  to the skin and wash                       Gently with a scrungie or clean washcloth.  5.  Apply the CHG Soap to your body ONLY FROM THE NECK DOWN.   Do not use on face/ open                           Wound or open sores. Avoid contact with eyes, ears mouth and  genitals (private parts).                       Wash face,  Genitals (private parts) with your normal soap.             6.  Wash thoroughly, paying special attention to the area where your surgery  will be performed.  7.  Thoroughly rinse your body with warm water from the neck down.  8.  DO NOT shower/wash with your normal soap after using and rinsing off  the CHG Soap.                9.  Pat yourself dry with a clean towel.            10.  Wear clean pajamas.  11.  Place clean sheets on your bed the night of your first shower and do not  sleep with pets. Day of Surgery : Do not apply any lotions/deodorants the morning of surgery.  Please wear clean clothes to the hospital/surgery center.  FAILURE TO FOLLOW THESE INSTRUCTIONS MAY RESULT IN THE CANCELLATION OF YOUR SURGERY PATIENT SIGNATURE_________________________________  NURSE SIGNATURE__________________________________  ________________________________________________________________________

## 2021-12-18 ENCOUNTER — Other Ambulatory Visit: Payer: Self-pay

## 2021-12-18 ENCOUNTER — Encounter: Payer: Self-pay | Admitting: Internal Medicine

## 2021-12-18 ENCOUNTER — Encounter (HOSPITAL_COMMUNITY)
Admission: RE | Admit: 2021-12-18 | Discharge: 2021-12-18 | Disposition: A | Discharge status: Home / Self Care | Payer: Medicare Other | Source: Ambulatory Visit | Attending: Urology | Admitting: Urology

## 2021-12-18 ENCOUNTER — Encounter (HOSPITAL_COMMUNITY): Payer: Self-pay

## 2021-12-18 VITALS — BP 112/72 | HR 76 | Temp 97.7°F | Ht 72.0 in | Wt 273.0 lb

## 2021-12-18 DIAGNOSIS — Z6837 Body mass index (BMI) 37.0-37.9, adult: Secondary | ICD-10-CM | POA: Insufficient documentation

## 2021-12-18 DIAGNOSIS — D6859 Other primary thrombophilia: Secondary | ICD-10-CM | POA: Diagnosis not present

## 2021-12-18 DIAGNOSIS — I10 Essential (primary) hypertension: Secondary | ICD-10-CM

## 2021-12-18 DIAGNOSIS — Z01812 Encounter for preprocedural laboratory examination: Secondary | ICD-10-CM | POA: Diagnosis present

## 2021-12-18 DIAGNOSIS — E1122 Type 2 diabetes mellitus with diabetic chronic kidney disease: Secondary | ICD-10-CM | POA: Insufficient documentation

## 2021-12-18 DIAGNOSIS — N183 Chronic kidney disease, stage 3 unspecified: Secondary | ICD-10-CM | POA: Insufficient documentation

## 2021-12-18 DIAGNOSIS — I129 Hypertensive chronic kidney disease with stage 1 through stage 4 chronic kidney disease, or unspecified chronic kidney disease: Secondary | ICD-10-CM | POA: Diagnosis not present

## 2021-12-18 DIAGNOSIS — C652 Malignant neoplasm of left renal pelvis: Secondary | ICD-10-CM | POA: Insufficient documentation

## 2021-12-18 DIAGNOSIS — I4821 Permanent atrial fibrillation: Secondary | ICD-10-CM | POA: Diagnosis not present

## 2021-12-18 DIAGNOSIS — E119 Type 2 diabetes mellitus without complications: Secondary | ICD-10-CM

## 2021-12-18 DIAGNOSIS — Z7901 Long term (current) use of anticoagulants: Secondary | ICD-10-CM | POA: Insufficient documentation

## 2021-12-18 DIAGNOSIS — H9190 Unspecified hearing loss, unspecified ear: Secondary | ICD-10-CM | POA: Diagnosis not present

## 2021-12-18 DIAGNOSIS — I442 Atrioventricular block, complete: Secondary | ICD-10-CM | POA: Insufficient documentation

## 2021-12-18 DIAGNOSIS — Z950 Presence of cardiac pacemaker: Secondary | ICD-10-CM | POA: Diagnosis not present

## 2021-12-18 DIAGNOSIS — Z01818 Encounter for other preprocedural examination: Secondary | ICD-10-CM

## 2021-12-18 LAB — CBC
HCT: 35.2 % — ABNORMAL LOW (ref 39.0–52.0)
Hemoglobin: 11.3 g/dL — ABNORMAL LOW (ref 13.0–17.0)
MCH: 30.5 pg (ref 26.0–34.0)
MCHC: 32.1 g/dL (ref 30.0–36.0)
MCV: 95.1 fL (ref 80.0–100.0)
Platelets: 227 10*3/uL (ref 150–400)
RBC: 3.7 MIL/uL — ABNORMAL LOW (ref 4.22–5.81)
RDW: 15.5 % (ref 11.5–15.5)
WBC: 9.2 10*3/uL (ref 4.0–10.5)
nRBC: 0 % (ref 0.0–0.2)

## 2021-12-18 LAB — BASIC METABOLIC PANEL WITH GFR
Anion gap: 9 (ref 5–15)
BUN: 41 mg/dL — ABNORMAL HIGH (ref 8–23)
CO2: 24 mmol/L (ref 22–32)
Calcium: 9.5 mg/dL (ref 8.9–10.3)
Chloride: 106 mmol/L (ref 98–111)
Creatinine, Ser: 1.67 mg/dL — ABNORMAL HIGH (ref 0.61–1.24)
GFR, Estimated: 41 mL/min — ABNORMAL LOW (ref >60)
Glucose, Bld: 102 mg/dL — ABNORMAL HIGH (ref 70–99)
Potassium: 4.2 mmol/L (ref 3.5–5.1)
Sodium: 139 mmol/L (ref 135–145)

## 2021-12-18 LAB — GLUCOSE, CAPILLARY: Glucose-Capillary: 127 mg/dL — ABNORMAL HIGH (ref 70–99)

## 2021-12-18 NOTE — Progress Notes (Signed)
[facility] DEVICE PROGRAMMING  Patient Information: Name:  [name]  DOB:  [birth year]  MRN:  [id number]    Joline Maxcy, RN  P Cv Div Heartcare Device Planned Procedure:  Left robotic nephroureterectomy  Surgeon:  Dr. Alexis Frock  Date of Procedure:  12/25/21  Cautery will be used.  Position during surgery:     Please send documentation back to:  [facility] (Fax # [phone])   Joline Maxcy, RN  12/17/2021 4:36 PM  Device Information:  Clinic EP Physician:  Thompson Grayer, MD   Device Type:  Pacemaker Manufacturer and Phone #:  Medtronic: [phone] Pacemaker Dependent?:  Yes.   Date of Last Device Check:  12/06/21 Remote) 10/02/21 (in-clinic) Normal Device Function?:  Yes.    Electrophysiologist's Recommendations:  Have magnet available. Provide continuous ECG monitoring when magnet is used or reprogramming is to be performed.  Procedure may interfere with device function.  Magnet should be placed over device during procedure. If magnet cannot remain safely over device throughout duration of procedure, please have industry present for reprogramming to asynchronous pacing.    Per Device Clinic Standing Orders, York Ram, RN  9:58 AM 12/18/2021

## 2021-12-18 NOTE — Progress Notes (Addendum)
Medtronic rep has been informed about the surgery and the device orders. Received a call back from [place] from [employer].He gather all the information about the surgery,he said they will be here for the surgery around 10:00 am.

## 2021-12-18 NOTE — Progress Notes (Signed)
COVID Vaccine Completed: Yes Date COVID Vaccine completed: 2021 x 2 COVID vaccine manufacturer:  Moderna    COVID Test: 12/23/21 @ 9:30 AM PCP -Dr. Shon Baton  Cardiologist - Dr. Thompson Grayer. Clearance: Alveria Apley NP: 11/14/21 : EPIC  Chest x-ray -  EKG - 10/02/21 Stress Test -  ECHO -  Cardiac Cath -  Pacemaker/ICD device last checked:12/06/21 remote  Sleep Study - Yes CPAP - NO  Fasting Blood Sugar - < 100's Checks Blood Sugar ___1__ times a week.  Blood Thinner Instructions: Coumadin will be held 5 days before surgery. Aspirin Instructions: Last Dose:  Anesthesia review: Hx: HTN,HB,CKD GNF,AOZ,HYQM,VHQIONGEX  Patient denies shortness of breath, fever, cough and chest pain at PAT appointment   Patient verbalized understanding of instructions that were given to them at the PAT appointment. Patient was also instructed that they will need to review over the PAT instructions again at home before surgery.

## 2021-12-19 LAB — HEMOGLOBIN A1C
Hgb A1c MFr Bld: 6.1 % — ABNORMAL HIGH (ref 4.8–5.6)
Mean Plasma Glucose: 128 mg/dL

## 2021-12-19 NOTE — Progress Notes (Signed)
Remote pacemaker transmission.   

## 2021-12-20 NOTE — Anesthesia Preprocedure Evaluation (Addendum)
Anesthesia Evaluation  Patient identified by MRN, date of birth, ID band Patient awake    Reviewed: Allergy & Precautions, NPO status , Patient's Chart, lab work & pertinent test results, reviewed documented beta blocker date and time   History of Anesthesia Complications Negative for: history of anesthetic complications  Airway Mallampati: II  TM Distance: >3 FB Neck ROM: Full    Dental  (+) Dental Advisory Given, Partial Lower, Partial Upper   Pulmonary neg pulmonary ROS,    Pulmonary exam normal        Cardiovascular hypertension, Pt. on medications and Pt. on home beta blockers Normal cardiovascular exam+ dysrhythmias Atrial Fibrillation + pacemaker (for CHB)      Neuro/Psych PSYCHIATRIC DISORDERS Depression negative neurological ROS     GI/Hepatic negative GI ROS, Neg liver ROS,   Endo/Other  diabetes, Type 2, Oral Hypoglycemic Agents Obesity   Renal/GU CRFRenal disease    Prostate cancer     Musculoskeletal  (+) Arthritis , Osteoarthritis,   Gout    Abdominal   Peds  Hematology  (+) Blood dyscrasia, anemia ,  Hx thrombophilia On coumadin    Anesthesia Other Findings   Reproductive/Obstetrics                           Anesthesia Physical Anesthesia Plan  ASA: 3  Anesthesia Plan: General   Post-op Pain Management: Tylenol PO (pre-op)   Induction: Intravenous  PONV Risk Score and Plan: 2 and Treatment may vary due to age or medical condition, Ondansetron and Propofol infusion  Airway Management Planned: Oral ETT  Additional Equipment: None  Intra-op Plan:   Post-operative Plan: Extubation in OR  Informed Consent: I have reviewed the patients History and Physical, chart, labs and discussed the procedure including the risks, benefits and alternatives for the proposed anesthesia with the patient or authorized representative who has indicated his/her understanding and  acceptance.     Dental advisory given  Plan Discussed with: CRNA and Anesthesiologist  Anesthesia Plan Comments:       Anesthesia Quick Evaluation

## 2021-12-20 NOTE — Progress Notes (Signed)
Anesthesia Chart Review:   Case: [id number] Date/Time: 12/25/21 1145   Procedures:      XI ROBOT ASSITED LAPAROSCOPIC NEPHROURETERECTOMY (Left)     CYSTOSCOPY WITH RETROGRADE PYELOGRAM/URETERAL STENT PLACEMENT (Left)   Anesthesia type: Choice   Pre-op diagnosis: LEFT RENAL PELVIS CANCER   [facility] ROOM 03 / [facility]   Surgeons: Alexis Frock, MD       DISCUSSION: Pt is 81 years old with hx permanent afib, complete heart block, pacemaker (Medtronic implanted 2010, gen change 08/17/15), hx cardiomyopathy (recovered by 2016), HTN, DM, CKD stage 3, thrombophilia  Hard of hearing  Pt to hold coumadin 5 days before surgery. PT/PTT will be obtained day of surgery  Perioperative prescription for pacemaker recommends:  Have magnet available. Provide continuous ECG monitoring when magnet is used or reprogramming is to be performed.  Procedure may interfere with device function.  Magnet should be placed over device during procedure. If magnet cannot remain safely over device throughout duration of procedure, please have industry present for reprogramming to asynchronous pacing.    VS: BP 112/72    Pulse 76    Temp 36.5 C (Oral)    Ht 6' (1.829 m)    Wt 123.8 kg    SpO2 99%    BMI 37.03 kg/m   PROVIDERS: - PCP is Shon Baton, MD - Cardiologist is Thompson Grayer, MD. Last office visit 10/02/21 with Tommye Standard, PA  LABS: Labs reviewed: Acceptable for surgery. - Cr 1.67. Consistent with prior results; ranged 1.5-1.72 over last 9 months - PT/PTT will be obtained day of surgery  (all labs ordered are listed, but only abnormal results are displayed)  Labs Reviewed  CBC - Abnormal; Notable for the following components:      Result Value   RBC 3.70 (*)    Hemoglobin 11.3 (*)    HCT 35.2 (*)    All other components within normal limits  BASIC METABOLIC PANEL - Abnormal; Notable for the following components:   Glucose, Bld 102 (*)    BUN 41 (*)    Creatinine, Ser 1.67 (*)    GFR,  Estimated 41 (*)    All other components within normal limits  HEMOGLOBIN A1C - Abnormal; Notable for the following components:   Hgb A1c MFr Bld 6.1 (*)    All other components within normal limits  GLUCOSE, CAPILLARY - Abnormal; Notable for the following components:   Glucose-Capillary 127 (*)    All other components within normal limits  TYPE AND SCREEN    EKG 10/02/21: V-paced   CV: Echo 11/15/15:  - Left ventricle: The cavity size was normal. Wall thickness was   normal. Systolic function was normal. The estimated ejection   fraction was in the range of 50% to 55%.  - Left atrium: The atrium was mildly dilated.  - Atrial septum: No defect or patent foramen ovale was identified   Past Medical History:  Diagnosis Date   Arthritis    CHB (complete heart block) (HCC)    patient has permanent pacemaker   Chronic anticoagulation    CKD (chronic kidney disease), stage III ([place])    per hx in OV note from Dr. Shon Baton, 08/06/21   Depression    Diabetes mellitus without complication ([place])    Dyspnea    Gout    History of echocardiogram    Echo 9/16:  EF 55-60%, no RWMA, mild MR, mild LAE, trivial PI   History of kidney stones  HOH (hard of hearing)    wears hearing aids   HTN (hypertension)    LV dysfunction    EF down to 30% per echo Feb 2013 -  started on ARB and low dose beta blocker   Morbid obesity ([place])    Nephrolithiasis    Occasional tremors 10/08/2021   Pt states that he has tremors in his hands, and that his doctor told him it was from old age.   Osteoarthritis    Pacemaker 11/17/2008   Permanent atrial fibrillation ([place])    failed cardioversion in April of 2010   Prostate CA [facility])    Thrombophilia [facility])    Urosepsis    Wears glasses    reading only   Wears partial dentures     Past Surgical History:  Procedure Laterality Date   CARDIOVERSION  02/15/2009   failed   CYSTOSCOPY WITH RETROGRADE PYELOGRAM, URETEROSCOPY AND STENT PLACEMENT Left  10/14/2021   Procedure: CYSTOSCOPY WITH RETROGRADE PYELOGRAM, URETEROSCOPY WITH  BIOPSY  AND STENT PLACEMENT;  Surgeon: Franchot Gallo, MD;  [facility];  Service: Urology;  Laterality: Left;   EP IMPLANTABLE DEVICE N/A 08/17/2015   Procedure: PPM Generator Changeout;  Surgeon: Thompson Grayer, MD;  Location: [place] CV LAB;  Service: Cardiovascular;  Laterality: N/A;   PACEMAKER PLACEMENT  11/17/2008   [employer]tronic   RADIOACTIVE SEED IMPLANT     1990's ?   SHOULDER SURGERY Left    pt unsure of date   TOTAL KNEE ARTHROPLASTY Left    many years ago per pat   TOTAL KNEE ARTHROPLASTY Right 05/14/2021   Procedure: TOTAL KNEE ARTHROPLASTY;  Surgeon: Paralee Cancel, MD;  [facility];  Service: Orthopedics;  Laterality: Right;   WISDOM TOOTH EXTRACTION      MEDICATIONS:  allopurinol (ZYLOPRIM) 300 MG tablet   carvedilol (COREG) 3.125 MG tablet   hydrochlorothiazide (HYDRODIURIL) 25 MG tablet   losartan (COZAAR) 50 MG tablet   metFORMIN (GLUCOPHAGE) 500 MG tablet   methocarbamol (ROBAXIN) 500 MG tablet   pravastatin (PRAVACHOL) 20 MG tablet   triamcinolone cream (KENALOG) 0.1 %   warfarin (COUMADIN) 5 MG tablet   No current facility-administered medications for this encounter.    If labs acceptable day of surgery, I anticipate pt can proceed with surgery as scheduled.   Willeen Cass, PhD, FNP-BC [facility] Short Stay Surgical Center/Anesthesiology Phone: [phone] 12/20/2021 10:13 AM]

## 2021-12-23 ENCOUNTER — Other Ambulatory Visit: Payer: Self-pay

## 2021-12-23 ENCOUNTER — Encounter (HOSPITAL_COMMUNITY)
Admission: RE | Admit: 2021-12-23 | Discharge: 2021-12-23 | Disposition: A | Discharge status: Home / Self Care | Payer: Medicare Other | Source: Ambulatory Visit | Attending: Urology | Admitting: Urology

## 2021-12-23 DIAGNOSIS — Z20822 Contact with and (suspected) exposure to covid-19: Secondary | ICD-10-CM | POA: Insufficient documentation

## 2021-12-23 DIAGNOSIS — Z01818 Encounter for other preprocedural examination: Secondary | ICD-10-CM

## 2021-12-23 DIAGNOSIS — Z01812 Encounter for preprocedural laboratory examination: Secondary | ICD-10-CM | POA: Insufficient documentation

## 2021-12-23 LAB — SARS CORONAVIRUS 2 (TAT 6-24 HRS): SARS Coronavirus 2: NEGATIVE

## 2021-12-24 ENCOUNTER — Encounter (HOSPITAL_COMMUNITY): Payer: Self-pay | Admitting: Urology

## 2021-12-25 ENCOUNTER — Inpatient Hospital Stay (HOSPITAL_COMMUNITY): Payer: Medicare Other

## 2021-12-25 ENCOUNTER — Inpatient Hospital Stay (HOSPITAL_COMMUNITY): Payer: Medicare Other | Admitting: Anesthesiology

## 2021-12-25 ENCOUNTER — Inpatient Hospital Stay (HOSPITAL_COMMUNITY)
Admission: RE | Admit: 2021-12-25 | Discharge: 2021-12-27 | DRG: 657 | Disposition: A | Discharge status: Home / Self Care | Payer: Medicare Other | Attending: Urology | Admitting: Urology

## 2021-12-25 ENCOUNTER — Encounter (HOSPITAL_COMMUNITY)
Admission: RE | Disposition: A | Discharge status: Home / Self Care | Payer: Self-pay | Source: Home / Self Care | Attending: Urology

## 2021-12-25 ENCOUNTER — Inpatient Hospital Stay (HOSPITAL_COMMUNITY): Payer: Medicare Other | Admitting: Emergency Medicine

## 2021-12-25 ENCOUNTER — Encounter (HOSPITAL_COMMUNITY): Payer: Self-pay | Admitting: Urology

## 2021-12-25 ENCOUNTER — Other Ambulatory Visit: Payer: Self-pay

## 2021-12-25 DIAGNOSIS — Z8249 Family history of ischemic heart disease and other diseases of the circulatory system: Secondary | ICD-10-CM

## 2021-12-25 DIAGNOSIS — R310 Gross hematuria: Secondary | ICD-10-CM | POA: Diagnosis present

## 2021-12-25 DIAGNOSIS — Z96653 Presence of artificial knee joint, bilateral: Secondary | ICD-10-CM | POA: Diagnosis present

## 2021-12-25 DIAGNOSIS — Z01818 Encounter for other preprocedural examination: Secondary | ICD-10-CM

## 2021-12-25 DIAGNOSIS — K660 Peritoneal adhesions (postprocedural) (postinfection): Secondary | ICD-10-CM | POA: Diagnosis present

## 2021-12-25 DIAGNOSIS — C642 Malignant neoplasm of left kidney, except renal pelvis: Secondary | ICD-10-CM | POA: Diagnosis present

## 2021-12-25 DIAGNOSIS — Z950 Presence of cardiac pacemaker: Secondary | ICD-10-CM

## 2021-12-25 DIAGNOSIS — I4821 Permanent atrial fibrillation: Secondary | ICD-10-CM | POA: Diagnosis present

## 2021-12-25 DIAGNOSIS — I442 Atrioventricular block, complete: Secondary | ICD-10-CM | POA: Diagnosis present

## 2021-12-25 DIAGNOSIS — E1122 Type 2 diabetes mellitus with diabetic chronic kidney disease: Secondary | ICD-10-CM | POA: Diagnosis present

## 2021-12-25 DIAGNOSIS — Z974 Presence of external hearing-aid: Secondary | ICD-10-CM

## 2021-12-25 DIAGNOSIS — K429 Umbilical hernia without obstruction or gangrene: Secondary | ICD-10-CM | POA: Diagnosis present

## 2021-12-25 DIAGNOSIS — D6859 Other primary thrombophilia: Secondary | ICD-10-CM | POA: Diagnosis present

## 2021-12-25 DIAGNOSIS — Z888 Allergy status to other drugs, medicaments and biological substances status: Secondary | ICD-10-CM

## 2021-12-25 DIAGNOSIS — N1832 Chronic kidney disease, stage 3b: Secondary | ICD-10-CM | POA: Diagnosis present

## 2021-12-25 DIAGNOSIS — I129 Hypertensive chronic kidney disease with stage 1 through stage 4 chronic kidney disease, or unspecified chronic kidney disease: Secondary | ICD-10-CM | POA: Diagnosis present

## 2021-12-25 DIAGNOSIS — Z6837 Body mass index (BMI) 37.0-37.9, adult: Secondary | ICD-10-CM

## 2021-12-25 DIAGNOSIS — Z20822 Contact with and (suspected) exposure to covid-19: Secondary | ICD-10-CM | POA: Diagnosis present

## 2021-12-25 DIAGNOSIS — Z885 Allergy status to narcotic agent status: Secondary | ICD-10-CM

## 2021-12-25 DIAGNOSIS — C61 Malignant neoplasm of prostate: Secondary | ICD-10-CM | POA: Diagnosis present

## 2021-12-25 DIAGNOSIS — C652 Malignant neoplasm of left renal pelvis: Principal | ICD-10-CM | POA: Diagnosis present

## 2021-12-25 HISTORY — PX: CYSTOSCOPY W/ URETERAL STENT PLACEMENT: SHX1429

## 2021-12-25 HISTORY — PX: ROBOT ASSITED LAPAROSCOPIC NEPHROURETERECTOMY: SHX6077

## 2021-12-25 LAB — GLUCOSE, CAPILLARY
Glucose-Capillary: 127 mg/dL — ABNORMAL HIGH (ref 70–99)
Glucose-Capillary: 144 mg/dL — ABNORMAL HIGH (ref 70–99)
Glucose-Capillary: 129 mg/dL — ABNORMAL HIGH (ref 70–99)

## 2021-12-25 LAB — CBC
HCT: 32.6 % — ABNORMAL LOW (ref 39.0–52.0)
Hemoglobin: 10.3 g/dL — ABNORMAL LOW (ref 13.0–17.0)
MCH: 30.6 pg (ref 26.0–34.0)
MCHC: 31.6 g/dL (ref 30.0–36.0)
MCV: 96.7 fL (ref 80.0–100.0)
Platelets: 196 10*3/uL (ref 150–400)
RBC: 3.37 MIL/uL — ABNORMAL LOW (ref 4.22–5.81)
RDW: 15.5 % (ref 11.5–15.5)
WBC: 10.6 10*3/uL — ABNORMAL HIGH (ref 4.0–10.5)
nRBC: 0 % (ref 0.0–0.2)

## 2021-12-25 LAB — BASIC METABOLIC PANEL
Anion gap: 10 (ref 5–15)
BUN: 62 mg/dL — ABNORMAL HIGH (ref 8–23)
CO2: 24 mmol/L (ref 22–32)
Calcium: 8.8 mg/dL — ABNORMAL LOW (ref 8.9–10.3)
Chloride: 107 mmol/L (ref 98–111)
Creatinine, Ser: 1.84 mg/dL — ABNORMAL HIGH (ref 0.61–1.24)
GFR, Estimated: 37 mL/min — ABNORMAL LOW (ref >60)
Glucose, Bld: 156 mg/dL — ABNORMAL HIGH (ref 70–99)
Potassium: 4 mmol/L (ref 3.5–5.1)
Sodium: 141 mmol/L (ref 135–145)

## 2021-12-25 LAB — PROTIME-INR
INR: 1.2 (ref 0.8–1.2)
Prothrombin Time: 14.8 seconds (ref 11.4–15.2)

## 2021-12-25 LAB — APTT: aPTT: 28 seconds (ref 24–36)

## 2021-12-25 LAB — TYPE AND SCREEN
ABO/RH(D): B NEG
Antibody Screen: NEGATIVE

## 2021-12-25 SURGERY — NEPHROURETERECTOMY, ROBOT-ASSISTED, LAPAROSCOPIC
Anesthesia: General | Site: Ureter | Laterality: Left

## 2021-12-25 MED ORDER — CARVEDILOL 3.125 MG PO TABS
3.1250 mg | ORAL_TABLET | Freq: Two times a day (BID) | ORAL | Status: DC
  Administered 2021-12-25 – 2021-12-27 (×4): 3.125 mg via ORAL
  Filled 2021-12-25 (×4): qty 1

## 2021-12-25 MED ORDER — SODIUM CHLORIDE (PF) 0.9 % IJ SOLN
INTRAMUSCULAR | Status: AC
  Filled 2021-12-25: qty 20

## 2021-12-25 MED ORDER — CHLORHEXIDINE GLUCONATE 0.12 % MT SOLN
15.0000 mL | Freq: Once | OROMUCOSAL | Status: AC
  Administered 2021-12-25: 15 mL via OROMUCOSAL

## 2021-12-25 MED ORDER — SODIUM CHLORIDE 0.9% FLUSH: 3.0000 mL | INTRAVENOUS | Status: DC | PRN

## 2021-12-25 MED ORDER — LIDOCAINE 2% (20 MG/ML) 5 ML SYRINGE
INTRAMUSCULAR | Status: DC | PRN
  Administered 2021-12-25: 80 mg via INTRAVENOUS

## 2021-12-25 MED ORDER — FENTANYL CITRATE (PF) 250 MCG/5ML IJ SOLN
INTRAMUSCULAR | Status: AC
  Filled 2021-12-25: qty 5

## 2021-12-25 MED ORDER — ALLOPURINOL 300 MG PO TABS
300.0000 mg | ORAL_TABLET | Freq: Every day | ORAL | Status: DC
  Administered 2021-12-26 – 2021-12-27 (×2): 300 mg via ORAL
  Filled 2021-12-25 (×2): qty 1

## 2021-12-25 MED ORDER — POLYETHYLENE GLYCOL 3350 17 GM/SCOOP PO POWD: 1.0000 | Freq: Once | ORAL | Status: DC

## 2021-12-25 MED ORDER — SODIUM CHLORIDE (PF) 0.9 % IJ SOLN
INTRAMUSCULAR | Status: AC
  Filled 2021-12-25: qty 10

## 2021-12-25 MED ORDER — FENTANYL CITRATE PF 50 MCG/ML IJ SOSY
25.0000 ug | PREFILLED_SYRINGE | INTRAMUSCULAR | Status: DC | PRN
  Administered 2021-12-25 (×3): 50 ug via INTRAVENOUS

## 2021-12-25 MED ORDER — SODIUM CHLORIDE (PF) 0.9 % IJ SOLN
INTRAMUSCULAR | Status: DC | PRN
  Administered 2021-12-25: 20 mL

## 2021-12-25 MED ORDER — INSULIN ASPART 100 UNIT/ML IJ SOLN
0.0000 [IU] | Freq: Three times a day (TID) | INTRAMUSCULAR | Status: DC
  Administered 2021-12-26: 4 [IU] via SUBCUTANEOUS
  Administered 2021-12-26: 3 [IU] via SUBCUTANEOUS

## 2021-12-25 MED ORDER — CEFAZOLIN IN SODIUM CHLORIDE 3-0.9 GM/100ML-% IV SOLN
3.0000 g | INTRAVENOUS | Status: AC
  Administered 2021-12-25: 3 g via INTRAVENOUS
  Filled 2021-12-25: qty 100

## 2021-12-25 MED ORDER — DEXAMETHASONE SODIUM PHOSPHATE 10 MG/ML IJ SOLN
INTRAMUSCULAR | Status: DC | PRN
  Administered 2021-12-25: 4 mg via INTRAVENOUS

## 2021-12-25 MED ORDER — METHOCARBAMOL 500 MG PO TABS: 500.0000 mg | ORAL_TABLET | Freq: Four times a day (QID) | ORAL | Status: DC | PRN

## 2021-12-25 MED ORDER — ACETAMINOPHEN 500 MG PO TABS
1000.0000 mg | ORAL_TABLET | Freq: Four times a day (QID) | ORAL | Status: DC
  Filled 2021-12-25: qty 2

## 2021-12-25 MED ORDER — ONDANSETRON HCL 4 MG/2ML IJ SOLN
INTRAMUSCULAR | Status: DC | PRN
  Administered 2021-12-25: 4 mg via INTRAVENOUS

## 2021-12-25 MED ORDER — ROCURONIUM BROMIDE 10 MG/ML (PF) SYRINGE
PREFILLED_SYRINGE | INTRAVENOUS | Status: DC | PRN
  Administered 2021-12-25: 70 mg via INTRAVENOUS
  Administered 2021-12-25: 30 mg via INTRAVENOUS
  Administered 2021-12-25: 40 mg via INTRAVENOUS

## 2021-12-25 MED ORDER — PHENYLEPHRINE HCL-NACL 20-0.9 MG/250ML-% IV SOLN
INTRAVENOUS | Status: DC | PRN
  Administered 2021-12-25: 60 ug/min via INTRAVENOUS

## 2021-12-25 MED ORDER — LIDOCAINE HCL (PF) 2 % IJ SOLN
INTRAMUSCULAR | Status: AC
  Filled 2021-12-25: qty 5

## 2021-12-25 MED ORDER — ROCURONIUM BROMIDE 10 MG/ML (PF) SYRINGE
PREFILLED_SYRINGE | INTRAVENOUS | Status: AC
  Filled 2021-12-25: qty 10

## 2021-12-25 MED ORDER — PHENYLEPHRINE 40 MCG/ML (10ML) SYRINGE FOR IV PUSH (FOR BLOOD PRESSURE SUPPORT)
PREFILLED_SYRINGE | INTRAVENOUS | Status: DC | PRN
  Administered 2021-12-25 (×5): 80 ug via INTRAVENOUS

## 2021-12-25 MED ORDER — TRAMADOL HCL 50 MG PO TABS
50.0000 mg | ORAL_TABLET | Freq: Four times a day (QID) | ORAL | Status: DC | PRN
  Administered 2021-12-26: 50 mg via ORAL
  Filled 2021-12-25: qty 1

## 2021-12-25 MED ORDER — PRAVASTATIN SODIUM 20 MG PO TABS
20.0000 mg | ORAL_TABLET | Freq: Every day | ORAL | Status: DC
  Administered 2021-12-25 – 2021-12-26 (×2): 20 mg via ORAL
  Filled 2021-12-25 (×2): qty 1

## 2021-12-25 MED ORDER — IOHEXOL 300 MG/ML  SOLN
INTRAMUSCULAR | Status: DC | PRN
  Administered 2021-12-25: 10 mL

## 2021-12-25 MED ORDER — BUPIVACAINE LIPOSOME 1.3 % IJ SUSP
INTRAMUSCULAR | Status: AC
  Filled 2021-12-25: qty 20

## 2021-12-25 MED ORDER — PROPOFOL 10 MG/ML IV BOLUS
INTRAVENOUS | Status: DC | PRN
  Administered 2021-12-25: 100 mg via INTRAVENOUS

## 2021-12-25 MED ORDER — CHLORHEXIDINE GLUCONATE CLOTH 2 % EX PADS: 6.0000 | MEDICATED_PAD | Freq: Every day | CUTANEOUS | Status: DC

## 2021-12-25 MED ORDER — HYDROCHLOROTHIAZIDE 25 MG PO TABS
25.0000 mg | ORAL_TABLET | Freq: Every day | ORAL | Status: DC
  Administered 2021-12-26: 25 mg via ORAL
  Filled 2021-12-25 (×2): qty 1

## 2021-12-25 MED ORDER — PHENYLEPHRINE HCL (PRESSORS) 10 MG/ML IV SOLN
INTRAVENOUS | Status: AC
  Filled 2021-12-25: qty 1

## 2021-12-25 MED ORDER — SUGAMMADEX SODIUM 200 MG/2ML IV SOLN
INTRAVENOUS | Status: DC | PRN
  Administered 2021-12-25: 247.6 mg via INTRAVENOUS

## 2021-12-25 MED ORDER — SUGAMMADEX SODIUM 500 MG/5ML IV SOLN
INTRAVENOUS | Status: AC
  Filled 2021-12-25: qty 5

## 2021-12-25 MED ORDER — VASOPRESSIN 20 UNIT/ML IV SOLN
INTRAVENOUS | Status: AC
  Filled 2021-12-25: qty 1

## 2021-12-25 MED ORDER — ONDANSETRON HCL 4 MG/2ML IJ SOLN: 4.0000 mg | Freq: Once | INTRAMUSCULAR | Status: DC | PRN

## 2021-12-25 MED ORDER — HYDROMORPHONE HCL 1 MG/ML IJ SOLN: 0.5000 mg | INTRAMUSCULAR | Status: DC | PRN

## 2021-12-25 MED ORDER — ONDANSETRON HCL 4 MG/2ML IJ SOLN: 4.0000 mg | INTRAMUSCULAR | Status: DC | PRN

## 2021-12-25 MED ORDER — ACETAMINOPHEN 500 MG PO TABS
1000.0000 mg | ORAL_TABLET | Freq: Once | ORAL | Status: AC
  Administered 2021-12-25: 1000 mg via ORAL
  Filled 2021-12-25: qty 2

## 2021-12-25 MED ORDER — BUPIVACAINE LIPOSOME 1.3 % IJ SUSP
INTRAMUSCULAR | Status: DC | PRN
  Administered 2021-12-25: 20 mL

## 2021-12-25 MED ORDER — SODIUM CHLORIDE 0.9 % IV SOLN: 250.0000 mL | INTRAVENOUS | Status: DC | PRN

## 2021-12-25 MED ORDER — FENTANYL CITRATE PF 50 MCG/ML IJ SOSY
PREFILLED_SYRINGE | INTRAMUSCULAR | Status: AC
  Filled 2021-12-25: qty 3

## 2021-12-25 MED ORDER — INSULIN ASPART 100 UNIT/ML IJ SOLN: 0.0000 [IU] | Freq: Every day | INTRAMUSCULAR | Status: DC

## 2021-12-25 MED ORDER — LACTATED RINGERS IV SOLN: INTRAVENOUS | Status: DC | PRN

## 2021-12-25 MED ORDER — VASOPRESSIN 20 UNIT/ML IV SOLN
INTRAVENOUS | Status: DC | PRN
  Administered 2021-12-25: 1 [IU] via INTRAVENOUS

## 2021-12-25 MED ORDER — DOCUSATE SODIUM 100 MG PO CAPS: 100.0000 mg | ORAL_CAPSULE | Freq: Two times a day (BID) | ORAL | Status: DC

## 2021-12-25 MED ORDER — LACTATED RINGERS IV SOLN: INTRAVENOUS | Status: DC

## 2021-12-25 MED ORDER — SODIUM CHLORIDE 0.9 % IV SOLN: INTRAVENOUS | Status: DC

## 2021-12-25 MED ORDER — PROPOFOL 10 MG/ML IV BOLUS
INTRAVENOUS | Status: AC
  Filled 2021-12-25: qty 20

## 2021-12-25 MED ORDER — FENTANYL CITRATE (PF) 100 MCG/2ML IJ SOLN
INTRAMUSCULAR | Status: DC | PRN
  Administered 2021-12-25 (×2): 50 ug via INTRAVENOUS
  Administered 2021-12-25: 100 ug via INTRAVENOUS

## 2021-12-25 MED ORDER — LACTATED RINGERS IR SOLN
Status: DC | PRN
  Administered 2021-12-25: 1000 mL

## 2021-12-25 MED ORDER — CEFAZOLIN SODIUM-DEXTROSE 1-4 GM/50ML-% IV SOLN
1.0000 g | Freq: Three times a day (TID) | INTRAVENOUS | Status: AC
  Administered 2021-12-25 – 2021-12-26 (×2): 1 g via INTRAVENOUS
  Filled 2021-12-25 (×3): qty 50

## 2021-12-25 MED ORDER — TRAMADOL HCL 50 MG PO TABS: 100.0000 mg | ORAL_TABLET | Freq: Four times a day (QID) | ORAL | Status: DC | PRN

## 2021-12-25 MED ORDER — ACETAMINOPHEN 10 MG/ML IV SOLN
1000.0000 mg | Freq: Four times a day (QID) | INTRAVENOUS | Status: AC
  Administered 2021-12-25 – 2021-12-26 (×4): 1000 mg via INTRAVENOUS
  Filled 2021-12-25 (×4): qty 100

## 2021-12-25 MED ORDER — STERILE WATER FOR IRRIGATION IR SOLN
Status: DC | PRN
  Administered 2021-12-25: 3000 mL

## 2021-12-25 MED ORDER — SODIUM CHLORIDE 0.9% FLUSH
3.0000 mL | Freq: Two times a day (BID) | INTRAVENOUS | Status: DC
  Administered 2021-12-25 – 2021-12-26 (×3): 3 mL via INTRAVENOUS

## 2021-12-25 MED ORDER — ORAL CARE MOUTH RINSE: 15.0000 mL | Freq: Once | OROMUCOSAL | Status: AC

## 2021-12-25 MED ORDER — TRAMADOL HCL 50 MG PO TABS: 50.0000 mg | ORAL_TABLET | Freq: Four times a day (QID) | ORAL | 0 refills | Status: DC | PRN

## 2021-12-25 MED ORDER — BACITRACIN-NEOMYCIN-POLYMYXIN 400-5-5000 EX OINT: 1.0000 "application " | TOPICAL_OINTMENT | Freq: Three times a day (TID) | CUTANEOUS | Status: DC | PRN

## 2021-12-25 SURGICAL SUPPLY — 73 items
BAG COUNTER SPONGE SURGICOUNT (BAG) IMPLANT
BAG LAPAROSCOPIC 12 15 PORT 16 (BASKET) ×2 IMPLANT
BAG RETRIEVAL 12/15 (BASKET) ×3
BAG URO CATCHER STRL LF (MISCELLANEOUS) ×3 IMPLANT
BASKET ZERO TIP NITINOL 2.4FR (BASKET) IMPLANT
CATH URETL OPEN END 6FR 70 (CATHETERS) ×1 IMPLANT
CHLORAPREP W/TINT 26 (MISCELLANEOUS) ×4 IMPLANT
CLIP LIGATING HEM O LOK PURPLE (MISCELLANEOUS) ×3 IMPLANT
CLIP LIGATING HEMO LOK XL GOLD (MISCELLANEOUS) ×4 IMPLANT
CLIP LIGATING HEMO O LOK GREEN (MISCELLANEOUS) ×3 IMPLANT
CLOTH BEACON ORANGE TIMEOUT ST (SAFETY) ×3 IMPLANT
COVER SURGICAL LIGHT HANDLE (MISCELLANEOUS) ×3 IMPLANT
COVER TIP SHEARS 8 DVNC (MISCELLANEOUS) ×2 IMPLANT
COVER TIP SHEARS 8MM DA VINCI (MISCELLANEOUS) ×3
CUTTER ECHEON FLEX ENDO 45 340 (ENDOMECHANICALS) ×1 IMPLANT
DERMABOND ADVANCED (GAUZE/BANDAGES/DRESSINGS) ×1
DERMABOND ADVANCED .7 DNX12 (GAUZE/BANDAGES/DRESSINGS) ×4 IMPLANT
DRAIN CHANNEL 15F RND FF 3/16 (WOUND CARE) IMPLANT
DRAPE ARM DVNC X/XI (DISPOSABLE) ×8 IMPLANT
DRAPE COLUMN DVNC XI (DISPOSABLE) ×2 IMPLANT
DRAPE DA VINCI XI ARM (DISPOSABLE) ×12
DRAPE DA VINCI XI COLUMN (DISPOSABLE) ×3
DRAPE INCISE IOBAN 66X45 STRL (DRAPES) ×3 IMPLANT
DRAPE SHEET LG 3/4 BI-LAMINATE (DRAPES) ×3 IMPLANT
ELECT PENCIL ROCKER SW 15FT (MISCELLANEOUS) ×3 IMPLANT
ELECT REM PT RETURN 15FT ADLT (MISCELLANEOUS) ×3 IMPLANT
EVACUATOR SILICONE 100CC (DRAIN) IMPLANT
GLOVE SURG ENC MOIS LTX SZ6.5 (GLOVE) ×3 IMPLANT
GLOVE SURG ENC TEXT LTX SZ7.5 (GLOVE) ×6 IMPLANT
GOWN STRL REUS W/TWL LRG LVL3 (GOWN DISPOSABLE) ×9 IMPLANT
GUIDEWIRE ANG ZIPWIRE 038X150 (WIRE) ×3 IMPLANT
GUIDEWIRE STR DUAL SENSOR (WIRE) IMPLANT
IRRIG SUCT STRYKERFLOW 2 WTIP (MISCELLANEOUS) ×3
IRRIGATION SUCT STRKRFLW 2 WTP (MISCELLANEOUS) ×2 IMPLANT
KIT BASIN OR (CUSTOM PROCEDURE TRAY) ×3 IMPLANT
KIT TURNOVER KIT A (KITS) IMPLANT
LOOP VESSEL MAXI BLUE (MISCELLANEOUS) ×3 IMPLANT
MANIFOLD NEPTUNE II (INSTRUMENTS) ×3 IMPLANT
MARKER SKIN DUAL TIP RULER LAB (MISCELLANEOUS) ×3 IMPLANT
NDL INSUFFLATION 14GA 120MM (NEEDLE) ×2 IMPLANT
NEEDLE INSUFFLATION 14GA 120MM (NEEDLE) ×3 IMPLANT
NS IRRIG 1000ML POUR BTL (IV SOLUTION) ×3 IMPLANT
PACK CYSTO (CUSTOM PROCEDURE TRAY) ×3 IMPLANT
PORT ACCESS TROCAR AIRSEAL 12 (TROCAR) ×2 IMPLANT
PORT ACCESS TROCAR AIRSEAL 5M (TROCAR) ×1
PROTECTOR NERVE ULNAR (MISCELLANEOUS) ×6 IMPLANT
RELOAD STAPLE 45 2.6 WHT THIN (STAPLE) IMPLANT
SEAL CANN UNIV 5-8 DVNC XI (MISCELLANEOUS) ×8 IMPLANT
SEAL XI 5MM-8MM UNIVERSAL (MISCELLANEOUS) ×12
SET TRI-LUMEN FLTR TB AIRSEAL (TUBING) ×3 IMPLANT
SOLUTION ELECTROLUBE (MISCELLANEOUS) ×3 IMPLANT
SPIKE FLUID TRANSFER (MISCELLANEOUS) ×3 IMPLANT
SPONGE T-LAP 18X18 ~~LOC~~+RFID (SPONGE) ×3 IMPLANT
STAPLE RELOAD 45 WHT (STAPLE) ×14 IMPLANT
STAPLE RELOAD 45MM WHITE (STAPLE) ×21
STENT URET 6FRX26 CONTOUR (STENTS) ×1 IMPLANT
SUT ETHILON 3 0 PS 1 (SUTURE) IMPLANT
SUT MNCRL AB 4-0 PS2 18 (SUTURE) ×6 IMPLANT
SUT PDS AB 1 CT1 27 (SUTURE) ×9 IMPLANT
SUT VIC AB 2-0 SH 27 (SUTURE) ×3
SUT VIC AB 2-0 SH 27X BRD (SUTURE) ×2 IMPLANT
SUT VLOC BARB 180 ABS3/0GR12 (SUTURE) ×3
SUTURE VLOC BRB 180 ABS3/0GR12 (SUTURE) ×2 IMPLANT
TOWEL OR 17X26 10 PK STRL BLUE (TOWEL DISPOSABLE) ×3 IMPLANT
TOWEL OR NON WOVEN STRL DISP B (DISPOSABLE) ×3 IMPLANT
TRAY FOLEY MTR SLVR 16FR STAT (SET/KITS/TRAYS/PACK) ×3 IMPLANT
TRAY LAPAROSCOPIC (CUSTOM PROCEDURE TRAY) ×3 IMPLANT
TROCAR BLADELESS OPT 5 100 (ENDOMECHANICALS) IMPLANT
TROCAR UNIVERSAL OPT 12M 100M (ENDOMECHANICALS) ×3 IMPLANT
TROCAR XCEL 12X100 BLDLESS (ENDOMECHANICALS) ×3 IMPLANT
TUBING CONNECTING 10 (TUBING) ×3 IMPLANT
TUBING UROLOGY SET (TUBING) IMPLANT
WATER STERILE IRR 1000ML POUR (IV SOLUTION) ×3 IMPLANT

## 2021-12-25 NOTE — Discharge Instructions (Addendum)
IMPORTANT: You may resume your warfarin on Monday 12/30/21 at regular dose  1- Drain Sites - You may have some mild persistent drainage from old drain site for several days, this is normal. This can be covered with cotton gauze for convenience.  2 - Stiches - Your stitches are all dissolvable. You may notice a "loose thread" at your incisions, these are normal and require no intervention. You may cut them flush to the skin with fingernail clippers if needed for comfort.  3 - Diet - No restrictions  4 - Activity - No heavy lifting / straining (any activities that require valsalva or "bearing down") x 4 weeks. Otherwise, no restrictions.  5 - Bathing - You may shower immediately. Do not take a bath or get into swimming pool where incision sites are submersed in water x 4 weeks.   6 - When to Call the Doctor - Call MD for any fever >102, any acute wound problems, or any severe nausea / vomiting. You can call the [facility] [phone]) 24 hours a day 365 days a year. It will roll-over to the answering service and on-call physician after hours.

## 2021-12-25 NOTE — Brief Op Note (Signed)
12/25/2021  6:04 PM  PATIENT:  [name]  81 y.o. male  PRE-OPERATIVE DIAGNOSIS:  LEFT RENAL PELVIS CANCER  POST-OPERATIVE DIAGNOSIS:  left renal pelvis cancer  PROCEDURE:  Procedure(s): XI ROBOT ASSITED LAPAROSCOPIC NEPHROURETERECTOMY, umblical hernia repair (Left) CYSTOSCOPY WITH RETROGRADE PYELOGRAM/URETERAL STENT PLACEMENT (Left)  SURGEON:  Surgeon(s) and Role:    Alexis Frock, MD - Primary  PHYSICIAN ASSISTANT:   ASSISTANTS: Clemetine Marker PA   ANESTHESIA:   general  EBL:  100 mL   BLOOD ADMINISTERED:none  DRAINS:  1 - JP to bulb; 2 - Foley to gravity    LOCAL MEDICATIONS USED:  MARCAINE     SPECIMEN:  Source of Specimen:  Left kidney + ureter  DISPOSITION OF SPECIMEN:  PATHOLOGY  COUNTS:  YES  TOURNIQUET:  * No tourniquets in log *  DICTATION: .Other Dictation: Dictation Number [id number]  PLAN OF CARE: Admit to inpatient   PATIENT DISPOSITION:  PACU - hemodynamically stable.   Delay start of Pharmacological VTE agent (>24hrs) due to surgical blood loss or risk of bleeding: yes

## 2021-12-25 NOTE — Progress Notes (Signed)
[employer] rep.just left, reprogrammed pacemaker. Will return after procedure to reprogram again.

## 2021-12-25 NOTE — Progress Notes (Signed)
Received pt from PACU, in stable condition, VS noted... Pt alert, non-tele pt. Foley pt. Incisions to abd c/d/I with adhesive bonding noted. Pt 10/10 while moving, pt settled down, pt 6/10 and dozing b/t arousable to verbal stimuli. Hand off completed . SRP RN

## 2021-12-25 NOTE — H&P (Signed)
[name] is an 81 y.o. male.    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 Clinically localized. 1 artery, 2 vein (lower accessory, upper dominant with to large lumbars) left renovacular anatomy. Rt kidney unremarkbale.   2 - Stage 3b Renal Insuficency - Cr 1.6's at baseline with GR upper 40s.   3 - Low Risk Prostate Cancer - s/p brachytrherapy 2002 for grade 1 disase. PSA <0.1 2022 ==> met all criteria for cure.   PMH sig for assrythmia / pacemaker / blood thinners (follows [facility], has come off before), morbid obesity, bilateral TKR. NO abd surgeries. His long tiem girlfriend [name] is very involved. His PCP is Donald Baton MD.   Today " [name] " is seen to proceed with LEFT robotic nephrouretercoty / stnet for large volume renal pelvis cancer. NO interval fevers. Hgb 11, A1c 6s, Cr 1.6, C19 screen negative.   Past Medical History:  Diagnosis Date   Arthritis    CHB (complete heart block) ([place])    patient has permanent pacemaker   Chronic anticoagulation    CKD (chronic kidney disease), stage III ([place])    per hx in OV note from Dr. Shon [name], 08/06/21   Depression    Diabetes mellitus without complication ([place])    Dyspnea    Gout    History of echocardiogram    Echo 9/16:  EF 55-60%, no RWMA, mild MR, mild LAE, trivial PI   History of kidney stones    HOH (hard of hearing)    wears hearing aids   HTN (hypertension)    LV dysfunction    EF down to 30% per echo Feb 2013 -  started on ARB and low dose beta blocker   Morbid obesity ([place])    Nephrolithiasis    Occasional tremors 10/08/2021   Pt states that he has tremors in his hands, and that his doctor told him it was from old age.   Osteoarthritis    Pacemaker 11/17/2008   Permanent atrial fibrillation ([place])    failed cardioversion in April of 2010   Prostate CA  [facility])    Thrombophilia [facility])    Urosepsis    Wears glasses    reading only   Wears partial dentures     Past Surgical History:  Procedure Laterality Date   CARDIOVERSION  02/15/2009   failed   CYSTOSCOPY WITH RETROGRADE PYELOGRAM, URETEROSCOPY AND STENT PLACEMENT Left 10/14/2021   Procedure: CYSTOSCOPY WITH RETROGRADE PYELOGRAM, URETEROSCOPY WITH  BIOPSY  AND STENT PLACEMENT;  Surgeon: Franchot Gallo, MD;  Location: [facility];  Service: Urology;  Laterality: Left;   EP IMPLANTABLE DEVICE N/A 08/17/2015   Procedure: PPM Generator Changeout;  Surgeon: Thompson Grayer, MD;  Location: [place] CV LAB;  Service: Cardiovascular;  Laterality: N/A;   PACEMAKER PLACEMENT  11/17/2008   [employer]   RADIOACTIVE SEED IMPLANT     1990's ?   SHOULDER SURGERY Left    pt unsure of date   TOTAL KNEE ARTHROPLASTY Left    many years ago per pat   TOTAL KNEE ARTHROPLASTY Right 05/14/2021   Procedure: TOTAL KNEE ARTHROPLASTY;  Surgeon: Paralee Cancel, MD;  [facility];  Service: Orthopedics;  Laterality: Right;   WISDOM TOOTH EXTRACTION      Family History  Problem Relation Age of Onset  Heart attack Father 59   Healthy Mother    Healthy Brother    Social History:  reports that he has never smoked. He has never used smokeless tobacco. He reports that he does not drink alcohol and does not use drugs.  Allergies:  Allergies  Allergen Reactions   Codeine     "Crazy reaction" per Dr. Jenny Reichmann Russo's history 08/06/21   Oxycodone Other (See Comments)    Makes feel funny   Sertraline Hcl Other (See Comments)    Makes feel funny   Sitagliptin Rash    No medications prior to admission.    No results found for this or any previous visit (from the past 48 hour(s)). No results found.  Review of Systems  Constitutional:  Negative for chills and fever.  All other systems reviewed and are negative.  There were no vitals taken for this visit. Physical Exam HENT:      Head: Normocephalic.  Eyes:     Pupils: Pupils are equal, round, and reactive to light.  Pulmonary:     Effort: Pulmonary effort is normal.  Abdominal:     General: Abdomen is flat.  Genitourinary:    Comments: No CVAT at present Musculoskeletal:     Cervical back: Normal range of motion.  Skin:    General: Skin is warm.  Neurological:     Mental Status: He is alert.  Psychiatric:        Mood and Affect: Mood normal.     Assessment/Plan  Proceed as planned with LEFT nephroureterectomy / cysto-stent with curative intent. Risks, benefits, alternatives, expected peri-op course discussed previously and reiterated today. He understands that hisbaselin renal insufficiency and CV diease increases risk of all peri-op complications includinng MI, ESRD, Mortality.   Alexis Frock, MD 12/25/2021, 8:03 AM

## 2021-12-25 NOTE — Transfer of Care (Signed)
Immediate Anesthesia Transfer of Care Note  Patient: [name]  Procedure(s) Performed: XI ROBOT ASSITED LAPAROSCOPIC NEPHROURETERECTOMY, umblical hernia repair (Left: Renal) CYSTOSCOPY WITH RETROGRADE PYELOGRAM/URETERAL STENT PLACEMENT (Left: Ureter)  Patient Location: PACU  Anesthesia Type:General  Level of Consciousness: drowsy and patient cooperative  Airway & Oxygen Therapy: Patient Spontanous Breathing and Patient connected to face mask oxygen  Post-op Assessment: Report given to RN and Post -op Vital signs reviewed and stable  Post vital signs: Reviewed and stable  Last Vitals:  Vitals Value Taken Time  BP 124/53 12/25/21 1632  Temp    Pulse 69 12/25/21 1637  Resp 15 12/25/21 1637  SpO2 100 % 12/25/21 1637  Vitals shown include unvalidated device data.  Last Pain:  Vitals:   12/25/21 1042  TempSrc:   PainSc: 2       Patients Stated Pain Goal: 1 (00/29/84 7308)  Complications: No notable events documented.

## 2021-12-25 NOTE — Anesthesia Postprocedure Evaluation (Signed)
Anesthesia Post Note  Patient: [name]  Procedure(s) Performed: XI ROBOT ASSITED LAPAROSCOPIC NEPHROURETERECTOMY, umblical hernia repair (Left: Renal) CYSTOSCOPY WITH RETROGRADE PYELOGRAM/URETERAL STENT PLACEMENT (Left: Ureter)     Patient location during evaluation: PACU Anesthesia Type: General Level of consciousness: awake and alert Pain management: pain level controlled Vital Signs Assessment: post-procedure vital signs reviewed and stable Respiratory status: spontaneous breathing, nonlabored ventilation and respiratory function stable Cardiovascular status: blood pressure returned to baseline and stable Postop Assessment: no apparent nausea or vomiting Anesthetic complications: no   No notable events documented.  Last Vitals:  Vitals:   12/25/21 1745 12/25/21 1800  BP: (!) 154/91 (!) 153/84  Pulse: 70 70  Resp: (!) 21 11  Temp: 36.7 C   SpO2: 99% 98%    Last Pain:  Vitals:   12/25/21 1800  TempSrc:   PainSc: [place]

## 2021-12-25 NOTE — Anesthesia Procedure Notes (Signed)
Procedure Name: Intubation Date/Time: 12/25/2021 12:59 PM Performed by: Lavina Hamman, CRNA Pre-anesthesia Checklist: Patient identified, Emergency Drugs available, Suction available, Patient being monitored and Timeout performed Patient Re-evaluated:Patient Re-evaluated prior to induction Oxygen Delivery Method: Circle system utilized Preoxygenation: Pre-oxygenation with 100% oxygen Induction Type: IV induction Ventilation: Mask ventilation without difficulty Laryngoscope Size: Mac and 4 Grade View: Grade I Tube type: Oral Tube size: 7.5 mm Number of attempts: 1 Airway Equipment and Method: Stylet Placement Confirmation: ETT inserted through vocal cords under direct vision, positive ETCO2, CO2 detector and breath sounds checked- equal and bilateral Secured at: 24 cm Tube secured with: Tape Dental Injury: Teeth and Oropharynx as per pre-operative assessment  Comments: ATOI

## 2021-12-26 ENCOUNTER — Other Ambulatory Visit: Payer: Self-pay

## 2021-12-26 ENCOUNTER — Encounter (HOSPITAL_COMMUNITY): Payer: Self-pay | Admitting: Urology

## 2021-12-26 DIAGNOSIS — I4821 Permanent atrial fibrillation: Secondary | ICD-10-CM | POA: Diagnosis not present

## 2021-12-26 DIAGNOSIS — Z20822 Contact with and (suspected) exposure to covid-19: Secondary | ICD-10-CM | POA: Diagnosis not present

## 2021-12-26 DIAGNOSIS — Z8249 Family history of ischemic heart disease and other diseases of the circulatory system: Secondary | ICD-10-CM | POA: Diagnosis not present

## 2021-12-26 DIAGNOSIS — Z6837 Body mass index (BMI) 37.0-37.9, adult: Secondary | ICD-10-CM | POA: Diagnosis not present

## 2021-12-26 DIAGNOSIS — R310 Gross hematuria: Secondary | ICD-10-CM | POA: Diagnosis not present

## 2021-12-26 DIAGNOSIS — Z885 Allergy status to narcotic agent status: Secondary | ICD-10-CM | POA: Diagnosis not present

## 2021-12-26 DIAGNOSIS — Z974 Presence of external hearing-aid: Secondary | ICD-10-CM | POA: Diagnosis not present

## 2021-12-26 DIAGNOSIS — Z888 Allergy status to other drugs, medicaments and biological substances status: Secondary | ICD-10-CM | POA: Diagnosis not present

## 2021-12-26 DIAGNOSIS — I129 Hypertensive chronic kidney disease with stage 1 through stage 4 chronic kidney disease, or unspecified chronic kidney disease: Secondary | ICD-10-CM | POA: Diagnosis not present

## 2021-12-26 DIAGNOSIS — E1122 Type 2 diabetes mellitus with diabetic chronic kidney disease: Secondary | ICD-10-CM | POA: Diagnosis not present

## 2021-12-26 DIAGNOSIS — K660 Peritoneal adhesions (postprocedural) (postinfection): Secondary | ICD-10-CM | POA: Diagnosis not present

## 2021-12-26 DIAGNOSIS — Z950 Presence of cardiac pacemaker: Secondary | ICD-10-CM | POA: Diagnosis not present

## 2021-12-26 DIAGNOSIS — I442 Atrioventricular block, complete: Secondary | ICD-10-CM | POA: Diagnosis not present

## 2021-12-26 DIAGNOSIS — C652 Malignant neoplasm of left renal pelvis: Secondary | ICD-10-CM | POA: Diagnosis not present

## 2021-12-26 DIAGNOSIS — C642 Malignant neoplasm of left kidney, except renal pelvis: Secondary | ICD-10-CM | POA: Diagnosis present

## 2021-12-26 DIAGNOSIS — K429 Umbilical hernia without obstruction or gangrene: Secondary | ICD-10-CM | POA: Diagnosis present

## 2021-12-26 DIAGNOSIS — Z96653 Presence of artificial knee joint, bilateral: Secondary | ICD-10-CM | POA: Diagnosis present

## 2021-12-26 DIAGNOSIS — D6859 Other primary thrombophilia: Secondary | ICD-10-CM | POA: Diagnosis not present

## 2021-12-26 DIAGNOSIS — C61 Malignant neoplasm of prostate: Secondary | ICD-10-CM | POA: Diagnosis not present

## 2021-12-26 DIAGNOSIS — N1832 Chronic kidney disease, stage 3b: Secondary | ICD-10-CM | POA: Diagnosis not present

## 2021-12-26 LAB — CBC
HCT: 29.6 % — ABNORMAL LOW (ref 39.0–52.0)
Hemoglobin: 9.4 g/dL — ABNORMAL LOW (ref 13.0–17.0)
MCH: 30.8 pg (ref 26.0–34.0)
MCHC: 31.8 g/dL (ref 30.0–36.0)
MCV: 97 fL (ref 80.0–100.0)
Platelets: 189 10*3/uL (ref 150–400)
RBC: 3.05 MIL/uL — ABNORMAL LOW (ref 4.22–5.81)
RDW: 15.5 % (ref 11.5–15.5)
WBC: 10.3 10*3/uL (ref 4.0–10.5)
nRBC: 0 % (ref 0.0–0.2)

## 2021-12-26 LAB — BASIC METABOLIC PANEL
Anion gap: 8 (ref 5–15)
BUN: 58 mg/dL — ABNORMAL HIGH (ref 8–23)
CO2: 24 mmol/L (ref 22–32)
Calcium: 8.6 mg/dL — ABNORMAL LOW (ref 8.9–10.3)
Chloride: 106 mmol/L (ref 98–111)
Creatinine, Ser: 2.17 mg/dL — ABNORMAL HIGH (ref 0.61–1.24)
GFR, Estimated: 30 mL/min — ABNORMAL LOW (ref >60)
Glucose, Bld: 131 mg/dL — ABNORMAL HIGH (ref 70–99)
Potassium: 4.2 mmol/L (ref 3.5–5.1)
Sodium: 138 mmol/L (ref 135–145)

## 2021-12-26 LAB — GLUCOSE, CAPILLARY
Glucose-Capillary: 129 mg/dL — ABNORMAL HIGH (ref 70–99)
Glucose-Capillary: 140 mg/dL — ABNORMAL HIGH (ref 70–99)
Glucose-Capillary: 97 mg/dL (ref 70–99)
Glucose-Capillary: 148 mg/dL — ABNORMAL HIGH (ref 70–99)

## 2021-12-26 MED ORDER — LORATADINE 10 MG PO TABS
10.0000 mg | ORAL_TABLET | Freq: Every day | ORAL | Status: DC
  Administered 2021-12-26 – 2021-12-27 (×2): 10 mg via ORAL
  Filled 2021-12-26 (×2): qty 1

## 2021-12-26 MED ORDER — HEPARIN SODIUM (PORCINE) 5000 UNIT/ML IJ SOLN
5000.0000 [IU] | INTRAMUSCULAR | Status: AC
  Administered 2021-12-27: 5000 [IU] via SUBCUTANEOUS
  Filled 2021-12-26: qty 1

## 2021-12-26 NOTE — Progress Notes (Signed)
1 Day Post-Op from robotic left nephro ureterectomy Subjective: The patient is doing well.  No nausea or vomiting. Pain is adequately controlled.  Ambulating in hallways.  Has not passed gas yet.  Objective: Vital signs in last 24 hours: Temp:  [96 F (35.6 C)-98.1 F (36.7 C)] 97.9 F (36.6 C) (02/09 0354) Pulse Rate:  [69-101] 74 (02/09 0354) Resp:  [7-21] 17 (02/09 0354) BP: (104-166)/(53-102) 125/68 (02/09 0354) SpO2:  [97 %-100 %] 99 % (02/09 0354) Weight:  [123.8 kg] 123.8 kg (02/08 1021)  Intake/Output from previous day: 02/08 0701 - 02/09 0700 In: 2950 [I.V.:2950] Out: 1055 [Urine:850; Drains:105; Blood:100] Intake/Output this shift: Total I/O In: 150 [I.V.:150] Out: 40 [Drains:40]  Physical Exam:  General: Alert and oriented. CV: RRR Lungs: Clear bilaterally. GI: Soft, Nondistended. Incisions: Clean and dry.  JP drain with serosanguineous output Urine: Clear Foley catheter Extremities: Nontender, no erythema, no edema.  Lab Results: Recent Labs    12/25/21 1746  HGB 10.3*  HCT 32.6*          Recent Labs    12/25/21 1746  CREATININE 1.84*           Results for orders placed or performed during the hospital encounter of 12/25/21 (from the past 24 hour(s))  Glucose, capillary     Status: Abnormal   Collection Time: 12/25/21 10:30 AM  Result Value Ref Range   Glucose-Capillary 127 (H) 70 - 99 mg/dL  Protime - INR     Status: None   Collection Time: 12/25/21 10:35 AM  Result Value Ref Range   Prothrombin Time 14.8 11.4 - 15.2 seconds   INR 1.2 0.8 - 1.2  APTT     Status: None   Collection Time: 12/25/21 10:35 AM  Result Value Ref Range   aPTT 28 24 - 36 seconds  CBC     Status: Abnormal   Collection Time: 12/25/21  5:46 PM  Result Value Ref Range   WBC 10.6 (H) 4.0 - 10.5 K/uL   RBC 3.37 (L) 4.22 - 5.81 MIL/uL   Hemoglobin 10.3 (L) 13.0 - 17.0 g/dL   HCT 32.6 (L) 39.0 - 52.0 %   MCV 96.7 80.0 - 100.0 fL   MCH 30.6 26.0 - 34.0 pg   MCHC 31.6  30.0 - 36.0 g/dL   RDW 15.5 11.5 - 15.5 %   Platelets 196 150 - 400 K/uL   nRBC 0.0 0.0 - 0.2 %  Basic metabolic panel     Status: Abnormal   Collection Time: 12/25/21  5:46 PM  Result Value Ref Range   Sodium 141 135 - 145 mmol/L   Potassium 4.0 3.5 - 5.1 mmol/L   Chloride 107 98 - 111 mmol/L   CO2 24 22 - 32 mmol/L   Glucose, Bld 156 (H) 70 - 99 mg/dL   BUN 62 (H) 8 - 23 mg/dL   Creatinine, Ser 1.84 (H) 0.61 - 1.24 mg/dL   Calcium 8.8 (L) 8.9 - 10.3 mg/dL   GFR, Estimated 37 (L) >60 mL/min   Anion gap 10 5 - 15  Glucose, capillary     Status: Abnormal   Collection Time: 12/25/21  6:18 PM  Result Value Ref Range   Glucose-Capillary 144 (H) 70 - 99 mg/dL   Comment 1 Notify RN    Comment 2 Document in Chart   Glucose, capillary     Status: Abnormal   Collection Time: 12/25/21  9:09 PM  Result Value Ref Range   Glucose-Capillary  129 (H) 70 - 99 mg/dL    Assessment/Plan: POD# 1 s/p Robotic laparoscopic nephroureterectomy.  Doing well postoperatively.  Patient with a history of heart block on warfarin at home.  1) Ambulate, Incentive spirometry 2) Advance diet as tolerated 3) Transition to oral pain medication 4) continue JP drain 5) D/C urethral catheter 6) if postop day 1 hemoglobin stable, subcutaneous heparin for chemical DVT prophylaxis today.  If hemoglobin remains stable, will transition to home warfarin   Bishop Limbo, MD [facility]    LOS: 1 day   [name] 12/26/2021, 6:46 AM

## 2021-12-26 NOTE — Plan of Care (Signed)

## 2021-12-26 NOTE — Progress Notes (Signed)
°   12/26/21 1100  Mobility  Activity Ambulated with assistance in hallway  Level of Assistance Modified independent, requires aide device or extra time  Assistive Device Other (Comment) (IV pole)  Distance Ambulated (ft) 300 ft  Activity Response Tolerated well  $Mobility charge 1 Mobility   Pt agreeable to mobilize this morning. Ambulated about 333ft in hall with RW, tolerated well. No complaints. Left pt in chair, call bell at side. RN/NT notified of session.   [place] Specialist Acute Rehab Services Office: [phone]

## 2021-12-26 NOTE — TOC Progression Note (Signed)
Transition of Care [facility]) - Progression Note    Patient Details  Name: ARJEN DERINGER MRN: [id number] Date of Birth: [birth year]  Transition [facility]) CM/SW Contact  Purcell Mouton, RN Phone Number: 12/26/2021, 3:48 PM  Clinical Narrative:      Transition of Care (TOC) Screening Note   Patient Details  Name: GUST EUGENE Date of Birth: [birth year]   Transition of Care [facility]) CM/SW Contact:    Purcell Mouton, RN Phone Number: 12/26/2021, 3:48 PM    Transition of Care Department [facility]) has reviewed patient and no TOC needs have been identified at this time. We will continue to monitor patient advancement through interdisciplinary progression rounds. If new patient transition needs arise, please place a TOC consult.         Expected Discharge Plan and Services                                                 Social Determinants of Health (SDOH) Interventions    Readmission Risk Interventions No flowsheet data found.

## 2021-12-26 NOTE — Op Note (Signed)
[name], [name] MEDICAL RECORD NO: [id number] ACCOUNT NO: [id number] DATE OF BIRTH: 12-Dec-1940 FAC[facility]: [facility]ATION: [id number] PHYSICIAN: Alexis Frock, MD  Operative Report   DATE OF PROCEDURE: 12/25/2021  PREOPERATIVE DIAGNOSIS:  Large volume left renal pelvis cancer.  PROCEDURE PERFORMED:  1.  Cystoscopy with left retrograde pyelogram interpretation. 2.  Insertion of left ureteral stent. 3.  Robotic-assisted laparoscopic left nephroureterectomy.  ESTIMATED BLOOD LOSS:  150 mL.  COMPLICATIONS:  None.  SPECIMENS:  Left kidney and ureter for permanent pathology.  FINDINGS: 1.  Two artery, one early branching vein, with multiple lumbar vein, left renal vascular anatomy as anticipated. 2.  Very large volume left renal pelvis cancer, nearly obliterating the renal pelvis with retrograde pyelogram. 3.  Severe desmoplastic reaction in the distal aspect of the ureter.  Formal bladder cuff not performed.  INDICATIONS FOR PROCEDURE:  The patient is a quite comorbid 81 year old male with history of morbid obesity, congestive heart failure, heart block, diabetes.  His endocrine disease is relatively controlled.  He does have a moderately good functional  status.  He was found on workup for hematuria to have a very large volume left renal pelvis cancer, entirely not amenable to endoscopic management.  Options were discussed including systemic therapy with noncurative intent, palliative intent versus more  curative intent management, with left nephroureterectomy being standard.  He does have some baseline renal insufficiency.  He is very understanding of perioperative risks as well as his significant comorbidity and the increased perioperative risk that, that brings.  However, he adamantly wished to proceed with curative intent therapy, left nephroureterectomy.  He has been extensively counseled by multiple providers, he has cardiac clearance, and presents for this today.  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 Informed consent was obtained and placed in medical record.  PROCEDURE IN DETAIL:  The patient being verified, procedure being left robotic nephroureterectomy, cystoscopy and left stent placement was confirmed.  Procedure timeout was performed.  Intravenous antibiotics administered. General endotracheal anesthesia  induced.  The patient initially placed into a low lithotomy position.  Sterile field was created.  Prepped and draped the patient's penis, perineum and proximal thighs using iodine.  Cystourethroscopy was performed using a 21-French rigid cystoscope  with offset lens.   Inspection of anterior and posterior urethra were unremarkable.  Inspection of urinary bladder revealed no diverticula, calcifications or papillary lesions.  The left ureteral orifice was cannulated with a 6-French end-hole catheter  and a left retrograde pyelogram was obtained.  Left retrograde pyelogram demonstrated single left ureter, single system left kidney.  However, the collecting system was nearly completely obliterated from large mass effect from known tumor.  There were no obvious filling defects within the ureter.  A  ZIPwire was advanced to the level of the upper pole and a new 6 x 26 Contour type stent was placed using cystoscopic and fluoroscopic guidance and proximal and distal plane were noted.  Foley catheter was placed per urethra for straight drain.  The  patient was then repositioned into a left side up full-flank position and pulling 15 degrees of table flexion, superior arm elevator, axillary roll, sequential compression devices, bottom leg bent, top leg straight.  He was further fashioned to the  operating table using 3-inch tape over foam padding across the supraxiphoid chest and his pelvis.  Bean bag was deployed as was axillary roll.  A new sterile field was created, prepping and draping the patient's entire left flank and abdomen using  chlorhexidine  gluconate.   Next, a high flow, low pressure pneumoperitoneum was obtained using Veress technique in the left lower quadrant and passed the aspiration and drop test.  An 8 mm robotic camera port was then placed in position approximately 2  handbreadths superior and lateral to the umbilicus.  Laparoscopic examination of the peritoneal cavity did reveal some omental adhesions in the area of the known umbilical hernia under laparoscopic vision and using surgeon's finger.  The omental fat was  reduced from the hernia completely, it is quite favorable.  Additional ports were placed as follows:  Left subcostal 8 mm robotic port just lateral to the plane of the camera port, midline superior 12 mm assistant port site AirSeal type approximately 3  fingerbreadths superior to the camera port plane, 12 mm assistant port site inferior purposely placed approximately 4 fingerbreadths superior to the umbilicus and not through the hernia defect, left inferior paramedian robotic port approximately 1  handbreadth superior to the pubic symphysis and one lateral robotic port approximately 1 handbreadth superior medial to the anterior iliac spine.  Robot was docked and passed the electronic checks.  Attention was directed at the development of  retroperitoneum.  Incision was made lateral to the descending colon from the area of the splenic flexure towards the area of the internal ring.  The colon was carefully swept medially.  Lateral splenic attachments were taken down to allow the spleen to  rotate medially away from the anterior surface of Gerota's fascia. Lower pole of the kidney area was identified and placed on gentle lateral traction.  Dissection proceeded medial to this. There was a significant amount of intra-abdominal and  retroperitoneal fat as anticipated with his body habitus.  Left ureter and gonadal vessels were encountered, gently placed on lateral traction.  The aorta was encountered and visualized as was the psoas  musculature.  Dissection proceeded within this  triangle towards the area of the renal hilum.  Renal hilum was quite complex as anticipated.  There was a small accessory lower pole artery.  This was controlled using extra-large Hem-o-lok clip proximal, staple load distal.  There was a dominant upper  pole renal artery similarly controlled using Hem-o-lok clip proximal, staple load distal.  This resulted in excellent arterial control of the left kidney.  The renal vein was early branching and there were 3 very large lumbar vessels as anticipated.  The  inferior branch was controlled using vascular stapler just lateral to the inferior most lumbar vessel and a small superior lumbar vessel was controlled using vascular stapler and the dominant upper pole branch of the vein was controlled using vascular  stapler.  This resulted in excellent hemostatic control of the hilum, I was quite happy with the safety of this.  Superior attachments were taken down with cautery scissors, allowing the kidney to be retracted inferiorly and a partial adrenal sparing was  performed using a vascular stapler, separating the upper medial pole perirenal tissue from the adrenal gland. Lateral attachments were taken down with cautery scissors.  This completely mobilized the left kidney.  The gonadal vessels were carefully  controlled using vascular stapler at approximately the mid portion of the ureter, such that the only remaining tissue tethering the kidney was the left ureter, with its stent in situ placed for better identification and mobilization.  The lateral medial  umbilical ligament on the left side was developed away from the abdomian wall and plane was developed lateral to this, thus this becoming the floor of the  dissection.  The left ureter was circumferentially mobilized distally distal to the iliac crossing into the deep pelvis. Given his  exceptionally challenging body habitus with his morbid obesity and also  significant desmoplastic reaction in the deep pelvis, the distalmost ureteral dissection became more complex and quite frankly hazardous distal to the iliac vessels. As his tumor  burden is renal only without any ureteral burden, it was felt that the optimal risks, benefits favored taking the ureter at this level.  Again, this  approximately 2 cm proximal to the ureterovesical junction.  As such, the ureter was purposely  transected for approximately 50% of its circumference after an extra-large Hemoclip was placed proximal to prevent anterograde flow of tumor cells in the urine.  The attempt at very careful transection of the remaining portion of the ureter was performed  in an effort to keep the distal aspect of the stent with the specimen.  However, the stent became transected into, with the distal end progressing antegrade.  There was excellent visualization of the open ureteral stump and this was oversewn using 3-0  V-Loc times several.  I was quite happy with the mucosal apposition and hemostasis of this.  This completely freed up the very large left kidney and ureter specimen, was placed in an extra-large EndoCatch bag for later retrieval.  Hemostasis was  excellent.  All sponge and needle counts were correct.  We had achieved the goals of the extirpated portion of the procedure today.  Robot was then undocked.  Specimen was retrieved by extending the previous inferior most assistant port site in the  midline inferiorly directly into the umbilical hernia purposefully, total distance approximately 6 cm and the very large kidney and ureter specimen was retrieved,set aside for permanent pathology.  The extraction site, which now incorporated his  umbilical hernia was then closed using Novafil figure-of-eight x7, thus performing open umbilical hernia repair through the extraction site.  Scarpa's reapproximated using running Vicryl.  All incision sites were infiltrated with dilute lipolyzed  Marcaine and  closed at the level of skin using subcuticular Monocryl followed by Dermabond.  Notably, a drain was placed through the lateral most robotic port site prior to cessation of laparoscopic portion.  A drain stitch was applied.  The patient was  then repositioned supine.  Penis prepped again using flexible cystoscopy.  The distal end of the stent was retrieved, inspected such that it was verified that the entire stent was removed.  The distal end was discarded.  A new Foley catheter was placed  per urethra for straight drain and the procedure was terminated.  The patient tolerated the procedure well.  No immediate complications.  The patient was taken to postanesthesia care in stable condition, with plan for reprogramming his pacemaker back  into original mode and inpatient admission.  Please note, first assistant Debbrah Alar was crucial for all portions of surgery today. She provided invaluable retraction, suctioning, vascular clipping, vascular stapling and general first assistance.   SHW D: 12/25/2021 6:16:10 pm T: 12/26/2021 9:24:00 am  JOB: [id number]/ [id number]

## 2021-12-27 LAB — BASIC METABOLIC PANEL
Anion gap: 8 (ref 5–15)
BUN: 48 mg/dL — ABNORMAL HIGH (ref 8–23)
CO2: 25 mmol/L (ref 22–32)
Calcium: 8.8 mg/dL — ABNORMAL LOW (ref 8.9–10.3)
Chloride: 105 mmol/L (ref 98–111)
Creatinine, Ser: 2.21 mg/dL — ABNORMAL HIGH (ref 0.61–1.24)
GFR, Estimated: 30 mL/min — ABNORMAL LOW (ref >60)
Glucose, Bld: 111 mg/dL — ABNORMAL HIGH (ref 70–99)
Potassium: 4 mmol/L (ref 3.5–5.1)
Sodium: 138 mmol/L (ref 135–145)

## 2021-12-27 LAB — HEMOGLOBIN AND HEMATOCRIT, BLOOD
HCT: 30.8 % — ABNORMAL LOW (ref 39.0–52.0)
Hemoglobin: 9.6 g/dL — ABNORMAL LOW (ref 13.0–17.0)

## 2021-12-27 LAB — SURGICAL PATHOLOGY

## 2021-12-27 LAB — GLUCOSE, CAPILLARY
Glucose-Capillary: 107 mg/dL — ABNORMAL HIGH (ref 70–99)
Glucose-Capillary: 120 mg/dL — ABNORMAL HIGH (ref 70–99)

## 2021-12-27 MED ORDER — WARFARIN SODIUM 5 MG PO TABS: 7.5000 mg | ORAL_TABLET | Freq: Every day | ORAL | Status: DC

## 2021-12-27 NOTE — Progress Notes (Signed)
Pt to be discharged to home this afternoon. Pt given discharge teaching/instructions including all home discharge Medications and schedules for these Medications. Pt verbalized understanding of all discharge teaching. Discharge AVS with the Patient at time of discharge. VSS and no acute changes noted with Pt's assessment at time of discharge

## 2021-12-27 NOTE — Care Management Important Message (Signed)
Important Message  Patient Details IM Letter given to the Patient. Name: GLEB MCGUIRE MRN: [id number] Date of Birth: 1941-03-27   Medicare Important Message Given:  Yes     Kerin Salen 12/27/2021, 10:52 AM

## 2021-12-27 NOTE — Discharge Summary (Signed)
[facility] Discharge Summary  Admit date: 12/25/2021  Discharge date and time: 12/27/21   Discharge to: Home  Discharge Service: Urology  Discharge Attending Physician:  Phebe Colla MD  Discharge  Diagnoses: Urothelial carcinoma of kidney, left [facility])  Secondary Diagnosis: Principal Problem:   Urothelial carcinoma of kidney, left ([place])   OR Procedures: Procedure(s): XI ROBOT ASSITED LAPAROSCOPIC NEPHROURETERECTOMY, umblical hernia repair CYSTOSCOPY WITH RETROGRADE PYELOGRAM/URETERAL STENT PLACEMENT 12/25/2021   Ancillary Procedures: None   Discharge Day Services: The patient was seen and examined by the Urology team both in the morning and immediately prior to discharge.  Vital signs and laboratory values were stable and within normal limits.  The physical exam was benign and unchanged and all surgical wounds were examined.  Discharge instructions were explained and all questions answered.  Subjective  No acute events overnight. Pain Controlled. No fever or chills.  Objective Patient Vitals for the past 8 hrs:  BP Temp Temp src Pulse Resp SpO2  12/27/21 0829 116/63 98.7 F (37.1 C) Oral 76 17 96 %  12/27/21 0729 127/73 97.9 F (36.6 C) Oral 79 18 96 %   Total I/O In: -  Out: 375 [Urine:375]  General Appearance:        No acute distress Lungs:                       Normal work of breathing on room air Heart:                                Regular rate and rhythm Abdomen:                         Soft, non-tender, non-distended. Incisions C/D/I. JP drina removed prior to discharge.  Extremities:                      Warm and well perfused   Hospital Course:  The patient underwent Left Robotic nephroureterectomy on 12/25/2021.  The patient tolerated the procedure well, was extubated in the OR, and afterwards was taken to the PACU for routine post-surgical care. When stable the patient was transferred to the floor.   The patient did well postoperatively.  The patients diet  was slowly advanced and at the time of discharge was tolerating a regular diet.  The patient was discharged home 2 Days Post-Op, at which point was tolerating a regular solid diet, was able to void spontaneously, have adequate pain control with P.O. pain medication, and could ambulate without difficulty. The patient will follow up with [place] for post op check.   He will restart his Warfarin at regular home dose on Monday 12/30/21.   Condition at Discharge: Improved  Discharge Medications:  Allergies as of 12/27/2021       Reactions   Codeine    "Crazy reaction" per Dr. Jenny Reichmann Russo's history 08/06/21   Oxycodone Other (See Comments)   Makes feel funny   Sertraline Hcl Other (See Comments)   Makes feel funny   Sitagliptin Rash        Medication List     TAKE these medications    allopurinol 300 MG tablet Commonly known as: ZYLOPRIM TAKE ONE TABLET BY MOUTH ONCE DAILY   carvedilol 3.125 MG tablet Commonly known as: COREG Take 1 tablet (3.125 mg total) by mouth 2 (two) times daily. Please schedule overdue appt  for future refills. 2nd attempt.   docusate sodium 100 MG capsule Commonly known as: COLACE Take 1 capsule (100 mg total) by mouth 2 (two) times daily.   hydrochlorothiazide 25 MG tablet Commonly known as: HYDRODIURIL Take 1 tablet by mouth once daily What changed:  how much to take how to take this when to take this   losartan 50 MG tablet Commonly known as: COZAAR TAKE 1 TABLET BY MOUTH ONCE DAILY.  PLEASE MAKE APPT WITH DR. ALLRED BEFORE ANYMORE REFILLS.   metFORMIN 500 MG tablet Commonly known as: GLUCOPHAGE Take 500 mg by mouth at bedtime.   methocarbamol 500 MG tablet Commonly known as: ROBAXIN Take 1 tablet (500 mg total) by mouth every 6 (six) hours as needed for muscle spasms.   pravastatin 20 MG tablet Commonly known as: PRAVACHOL Take 20 mg by mouth at bedtime.   traMADol 50 MG tablet Commonly known as: Ultram Take 1-2 tablets (50-100 mg total) by  mouth every 6 (six) hours as needed for moderate pain or severe pain.   triamcinolone cream 0.1 % Commonly known as: KENALOG Apply 1 application topically as needed (rash).   warfarin 5 MG tablet Commonly known as: COUMADIN Take 1.5 tablets (7.5 mg total) by mouth daily. Take 7.5 mg by mouth daily Start taking on: December 30, 2021 What changed: These instructions start on December 30, 2021. If you are unsure what to do until then, ask your doctor or other care provider.

## 2021-12-27 NOTE — Progress Notes (Addendum)
2 Days Post-Op from robotic left nephro ureterectomy Subjective: The patient is doing well.  No nausea or vomiting. Pain is adequately controlled.  Ambulating in hallways.  Tolerating oral diet.  600 cc of urine during overnight shift.  110 cc out from the JP drain.  Hemoglobin is stable at 9.6 from 9.4.  Creatinine stable at 2.2.  Objective: Vital signs in last 24 hours: Temp:  [97.6 F (36.4 C)-98.7 F (37.1 C)] 98.7 F (37.1 C) (02/10 0829) Pulse Rate:  [71-82] 76 (02/10 0829) Resp:  [17-20] 17 (02/10 0829) BP: (107-127)/(63-73) 116/63 (02/10 0829) SpO2:  [96 %-97 %] 96 % (02/10 0829)  Intake/Output from previous day: 02/09 0701 - 02/10 0700 In: 120 [P.O.:120] Out: 755 [Urine:600; Drains:155] Intake/Output this shift: Total I/O In: -  Out: 375 [Urine:375]  Physical Exam:  General: Alert and oriented. CV: RRR Lungs: Clear bilaterally. GI: Soft, Nondistended. Incisions: Clean and dry.  JP drain with serosanguineous output Urine: Voiding spontaneously Extremities: Nontender, no erythema, no edema.  Lab Results: Recent Labs    12/25/21 1746 12/26/21 0347 12/27/21 0401  HGB 10.3* 9.4* 9.6*  HCT 32.6* 29.6* 30.8*           Recent Labs    12/25/21 1746 12/26/21 0347 12/27/21 0401  CREATININE 1.84* 2.17* 2.21*            Results for orders placed or performed during the hospital encounter of 12/25/21 (from the past 24 hour(s))  Glucose, capillary     Status: Abnormal   Collection Time: 12/26/21 11:40 AM  Result Value Ref Range   Glucose-Capillary 140 (H) 70 - 99 mg/dL  Glucose, capillary     Status: None   Collection Time: 12/26/21  5:14 PM  Result Value Ref Range   Glucose-Capillary 97 70 - 99 mg/dL  Glucose, capillary     Status: Abnormal   Collection Time: 12/26/21  8:45 PM  Result Value Ref Range   Glucose-Capillary 148 (H) 70 - 99 mg/dL  Hemoglobin and hematocrit, blood     Status: Abnormal   Collection Time: 12/27/21  4:01 AM  Result Value Ref  Range   Hemoglobin 9.6 (L) 13.0 - 17.0 g/dL   HCT 30.8 (L) 39.0 - 09.7 %  Basic metabolic panel     Status: Abnormal   Collection Time: 12/27/21  4:01 AM  Result Value Ref Range   Sodium 138 135 - 145 mmol/L   Potassium 4.0 3.5 - 5.1 mmol/L   Chloride 105 98 - 111 mmol/L   CO2 25 22 - 32 mmol/L   Glucose, Bld 111 (H) 70 - 99 mg/dL   BUN 48 (H) 8 - 23 mg/dL   Creatinine, Ser 2.21 (H) 0.61 - 1.24 mg/dL   Calcium 8.8 (L) 8.9 - 10.3 mg/dL   GFR, Estimated 30 (L) >60 mL/min   Anion gap 8 5 - 15  Glucose, capillary     Status: Abnormal   Collection Time: 12/27/21  7:46 AM  Result Value Ref Range   Glucose-Capillary 107 (H) 70 - 99 mg/dL    Assessment/Plan: POD# 1 s/p Robotic laparoscopic nephroureterectomy.  Doing well postoperatively.  Patient with a history of heart block on warfarin at home.  1) Ambulate, Incentive spirometry 2) anticoagulation plan: Restart home warfarin at his regular dose on Monday 3) Discontinue JP drain 4) potentially home today   Bishop Limbo, MD [facility] PGY4 [facility]    LOS: 2 days   Boris Engelmann [place] 12/27/2021, 8:36 AM

## 2022-03-14 ENCOUNTER — Ambulatory Visit: Payer: Medicare Other | Admitting: Physician Assistant

## 2022-03-24 ENCOUNTER — Ambulatory Visit (INDEPENDENT_AMBULATORY_CARE_PROVIDER_SITE_OTHER): Payer: Medicare Other

## 2022-03-24 DIAGNOSIS — I442 Atrioventricular block, complete: Secondary | ICD-10-CM

## 2022-03-25 LAB — CUP PACEART REMOTE DEVICE CHECK
Battery Impedance: 578 Ohm
Battery Remaining Longevity: 78 mo
Battery Voltage: 2.78 V
Brady Statistic RV Percent Paced: 100 %
Date Time Interrogation Session: 20230508094913
Implantable Lead Implant Date: 20100125
Implantable Lead Implant Date: 20100125
Implantable Lead Location: 753859
Implantable Lead Location: 753860
Implantable Lead Model: 4470
Implantable Lead Model: 4471
Implantable Lead Serial Number: 475977
Implantable Lead Serial Number: 639547
Implantable Pulse Generator Implant Date: 20160930
Lead Channel Impedance Value: 409 Ohm
Lead Channel Impedance Value: 67 Ohm
Lead Channel Pacing Threshold Amplitude: 1.125 V
Lead Channel Pacing Threshold Pulse Width: 0.4 ms
Lead Channel Setting Pacing Amplitude: 2.5 V
Lead Channel Setting Pacing Pulse Width: 0.4 ms
Lead Channel Setting Sensing Sensitivity: 4 mV

## 2022-04-10 NOTE — Progress Notes (Signed)
Remote pacemaker transmission.   

## 2022-07-03 ENCOUNTER — Ambulatory Visit: Payer: Medicare Other | Admitting: Orthopedic Surgery

## 2022-09-15 NOTE — Progress Notes (Unsigned)
Cardiology Office Note Date:  09/15/2022  Patient ID:  [name] 04, 1942, MRN [id number] PCP:  Shon Baton, MD  Electrophysiologist: [name]     Chief Complaint: pre-op  History of Present Illness: [name] is a 81 y.o. male with history of arthritis, HTN, permanent afib, CHB w/PPM, obesity, DM, hx of CM with recovered LVEF by last echo  He saw [name], via tele health visit Aug 2020, he was doing well, no changes were made  He saw A. Chalmers Cater, PA-C June 2022 cleared for knee surgery  He needs clearance for Urethroscopy and possible biopsy of renal mass, scheduled for 10/14/21 Needs to hold warfarin 5 days I saw him Nov 2022 He is doing well. Since his knee surgery this summer and recovered from that his ability to be more active and exertional activities have increased quite a bit. He denies any kind of CP, palpitations or cardiac awareness No rest or positional SOB, he will get a little winded with significantly increased activities, though this is his baseline and denies difficulties with ADLs His PMD manages his warfarin and will be peri-operatively as well. 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 Check an echo?  Device information MDT dual chamber PPM implanted 12/11/2008, gen change 08/17/2015 Programmed VVIR   Past Medical History:  Diagnosis Date   Arthritis    CHB (complete heart block) ([place])    patient has permanent pacemaker   Chronic anticoagulation    CKD (chronic kidney disease), stage III ([place])    per hx in OV note from Dr. Shon Baton,  08/06/21   Depression    Diabetes mellitus without complication ([place])    Dyspnea    Gout    History of echocardiogram    Echo 9/16:  EF 55-60%, no RWMA, mild MR, mild LAE, trivial PI   History of kidney stones    HOH (hard of hearing)    wears hearing aids   HTN (hypertension)    LV dysfunction    EF down to 30% per echo Feb 2013 -  started on ARB and low dose beta blocker   Morbid obesity ([place])    Nephrolithiasis    Occasional tremors 10/08/2021   Pt states that he has tremors in his hands, and that his doctor told him it was from old age.   Osteoarthritis    Pacemaker 11/17/2008   Permanent atrial fibrillation ([place])    failed cardioversion in April of 2010   Prostate CA [facility])    Thrombophilia [facility])    Urosepsis    Wears glasses    reading only   Wears partial dentures     Past Surgical History:  Procedure Laterality Date   CARDIOVERSION  02/15/2009   failed   CYSTOSCOPY W/ URETERAL STENT PLACEMENT Left 12/25/2021   Procedure: CYSTOSCOPY WITH RETROGRADE PYELOGRAM/URETERAL STENT PLACEMENT;  Surgeon: Alexis Frock, MD;  [facility];  Service: Urology;  Laterality: Left;   CYSTOSCOPY WITH RETROGRADE PYELOGRAM, URETEROSCOPY AND STENT PLACEMENT Left 10/14/2021   Procedure: CYSTOSCOPY WITH RETROGRADE PYELOGRAM, URETEROSCOPY WITH  BIOPSY  AND STENT PLACEMENT;  Surgeon: Franchot Gallo,  MD;  Location: [place];  Service: Urology;  Laterality: Left;   EP IMPLANTABLE DEVICE N/A 08/17/2015   Procedure: PPM Generator Changeout;  Surgeon: Thompson Grayer, MD;  Location: [place] [facility];  Service: Cardiovascular;  Laterality: N/A;   PACEMAKER PLACEMENT  11/17/2008   Medtronic   RADIOACTIVE SEED IMPLANT     1990's ?   ROBOT ASSITED LAPAROSCOPIC NEPHROURETERECTOMY Left 12/25/2021   Procedure: XI ROBOT ASSITED LAPAROSCOPIC NEPHROURETERECTOMY, umblical hernia repair;  Surgeon: Alexis Frock, MD;  [facility];  Service: Urology;  Laterality: Left;   SHOULDER  SURGERY Left    pt unsure of date   TOTAL KNEE ARTHROPLASTY Left    many years ago per pat   TOTAL KNEE ARTHROPLASTY Right 05/14/2021   Procedure: TOTAL KNEE ARTHROPLASTY;  Surgeon: Paralee Cancel, MD;  [facility];  Service: Orthopedics;  Laterality: Right;   WISDOM TOOTH EXTRACTION      Current Outpatient Medications  Medication Sig Dispense Refill   allopurinol (ZYLOPRIM) 300 MG tablet TAKE ONE TABLET BY MOUTH ONCE DAILY 30 tablet 0   carvedilol (COREG) 3.125 MG tablet Take 1 tablet (3.125 mg total) by mouth 2 (two) times daily. Please schedule overdue appt for future refills. 2nd attempt. 30 tablet 0   docusate sodium (COLACE) 100 MG capsule Take 1 capsule (100 mg total) by mouth 2 (two) times daily.     hydrochlorothiazide (HYDRODIURIL) 25 MG tablet Take 1 tablet by mouth once daily (Patient taking differently: at bedtime.) 30 tablet 0   losartan (COZAAR) 50 MG tablet TAKE 1 TABLET BY MOUTH ONCE DAILY.  PLEASE MAKE APPT WITH DR. ALLRED BEFORE ANYMORE REFILLS. 15 tablet 1   metFORMIN (GLUCOPHAGE) 500 MG tablet Take 500 mg by mouth at bedtime.     methocarbamol (ROBAXIN) 500 MG tablet Take 1 tablet (500 mg total) by mouth every 6 (six) hours as needed for muscle spasms. 40 tablet 0   pravastatin (PRAVACHOL) 20 MG tablet Take 20 mg by mouth at bedtime.      traMADol (ULTRAM) 50 MG tablet Take 1-2 tablets (50-100 mg total) by mouth every 6 (six) hours as needed for moderate pain or severe pain. 20 tablet 0   triamcinolone cream (KENALOG) 0.1 % Apply 1 application topically as needed (rash).     warfarin (COUMADIN) 5 MG tablet Take 1.5 tablets (7.5 mg total) by mouth daily. Take 7.5 mg by mouth daily     No current facility-administered medications for this visit.    Allergies:   Codeine, Oxycodone, Sertraline hcl, and Sitagliptin   Social History:  The patient  reports that he has never smoked. He has never used smokeless tobacco. He reports that he does not drink alcohol and does  not use drugs.   Family History:  The patient's family history includes Healthy in his brother and mother; Heart attack (age of onset: 19) in his father.  ROS:  Please see the history of present illness.    All other systems are reviewed and otherwise negative.   PHYSICAL EXAM:  VS:  There were no vitals taken for this visit. BMI: There is no height or weight on file to calculate BMI. Well nourished, well developed, in no acute distress HEENT: normocephalic, atraumatic Neck: no JVD, carotid bruits or masses Cardiac:  *** RRR (paced); no significant murmurs, no rubs, or gallops Lungs:  *** CTA b/l, no wheezing, rhonchi or rales Abd: soft, nontender MS: no deformity or atrophy Ext: *** trace pitting/more brawny  type edema LLE to above the ankle, lass on the right (he reports this as his baseline for years, is better in the AM and accumulates through the day, rarely uses his PRN lasix Skin: warm and dry, no rash Neuro:  No gross deficits appreciated Psych: euthymic mood, full affect  *** PPM site is stable, no tethering or discomfort   EKG:  Done today and reviewed by myself shows  ***  Device interrogation done today and reviewed by myself:  ***   11/15/2015; TTE Study Conclusions  - Left ventricle: The cavity size was normal. Wall thickness was    normal. Systolic function was normal. The estimated ejection    fraction was in the range of 50% to 55%.  - Left atrium: The atrium was mildly dilated.  - Atrial septum: No defect or patent foramen ovale was identified.  Recent Labs: 12/26/2021: Platelets 189 12/27/2021: BUN 48; Creatinine, Ser 2.21; Hemoglobin 9.6; Potassium 4.0; Sodium 138  No results found for requested labs within last 365 days.   CrCl cannot be calculated (Patient's most recent lab result is older than the maximum 21 days allowed.).   Wt Readings from Last 3 Encounters:  12/25/21 272 lb 14.9 oz (123.8 kg)  12/18/21 273 lb (123.8 kg)  10/14/21 272 lb 4.8 oz  (123.5 kg)     Other studies reviewed: Additional studies/records reviewed today include: summarized above  ASSESSMENT AND PLAN:  PPM *** Intact function *** No programming changes made  Permanent Afib CHA2DS2Vasc is *** 5, on warfarin, monitored and managed with his PMD  HTN *** Looks OK, no changes  4.  Hx of CM  w/recovered LVEF by his last echo, 2016 *** No symptoms to suggest clinical change *** Chronic LE edema, he reports for years, sounds of dependent edema, rare use of his PRN lasix    Disposition: ***    Current medicines are reviewed at length with the patient today.  The patient did not have any concerns regarding medicines.  Venetia Night, PA-C 09/15/2022 1:28 PM     [place]  [id number] [phone] (office)  [phone] (fax)

## 2022-09-17 ENCOUNTER — Encounter: Payer: Self-pay | Admitting: Physician Assistant

## 2022-09-17 ENCOUNTER — Ambulatory Visit: Payer: Medicare Other | Attending: Physician Assistant | Admitting: Physician Assistant

## 2022-09-17 VITALS — BP 140/70 | HR 73 | Ht 72.0 in | Wt 256.0 lb

## 2022-09-17 DIAGNOSIS — I4821 Permanent atrial fibrillation: Secondary | ICD-10-CM | POA: Diagnosis not present

## 2022-09-17 DIAGNOSIS — Z950 Presence of cardiac pacemaker: Secondary | ICD-10-CM | POA: Diagnosis not present

## 2022-09-17 DIAGNOSIS — I428 Other cardiomyopathies: Secondary | ICD-10-CM

## 2022-09-17 DIAGNOSIS — I442 Atrioventricular block, complete: Secondary | ICD-10-CM

## 2022-09-17 DIAGNOSIS — I10 Essential (primary) hypertension: Secondary | ICD-10-CM

## 2022-09-17 LAB — CUP PACEART INCLINIC DEVICE CHECK
Battery Impedance: 782 Ohm
Battery Remaining Longevity: 69 mo
Battery Voltage: 2.77 V
Brady Statistic RV Percent Paced: 100 %
Date Time Interrogation Session: 20231101140729
Implantable Lead Connection Status: 753985
Implantable Lead Connection Status: 753985
Implantable Lead Implant Date: 20100125
Implantable Lead Implant Date: 20100125
Implantable Lead Location: 753859
Implantable Lead Location: 753860
Implantable Lead Model: 4470
Implantable Lead Model: 4471
Implantable Lead Serial Number: 475977
Implantable Lead Serial Number: 639547
Implantable Pulse Generator Implant Date: 20160930
Lead Channel Impedance Value: 442 Ohm
Lead Channel Impedance Value: 67 Ohm
Lead Channel Pacing Threshold Amplitude: 1 V
Lead Channel Pacing Threshold Amplitude: 1 V
Lead Channel Pacing Threshold Pulse Width: 0.4 ms
Lead Channel Pacing Threshold Pulse Width: 0.4 ms
Lead Channel Setting Pacing Amplitude: 2.5 V
Lead Channel Setting Pacing Pulse Width: 0.4 ms
Lead Channel Setting Sensing Sensitivity: 4 mV
Zone Setting Status: 755011
Zone Setting Status: 755011

## 2022-09-17 NOTE — Patient Instructions (Signed)
Medication Instructions:    Your physician recommends that you continue on your current medications as directed. Please refer to the Current Medication list given to you today.   *If you need a refill on your cardiac medications before your next appointment, please call your pharmacy*   Lab Work: [place]    If you have labs (blood work) drawn today and your tests are completely normal, you will receive your results only by: [place] (if you have MyChart) OR A paper copy in the mail If you have any lab test that is abnormal or we need to change your treatment, we will call you to review the results.   Testing/Procedures: NONE ORDERED  TODAY      Follow-Up: At [facility], you and your health needs are our priority.  As part of our continuing mission to provide you with exceptional heart care, we have created designated Provider Care Teams.  These Care Teams include your primary Cardiologist (physician) and Advanced Practice Providers (APPs -  Physician Assistants and Nurse Practitioners) who all work together to provide you with the care you need, when you need it.  We recommend signing up for the patient portal called "MyChart".  Sign up information is provided on this After Visit Summary.  MyChart is used to connect with patients for Virtual Visits (Telemedicine).  Patients are able to view lab/test results, encounter notes, upcoming appointments, etc.  Non-urgent messages can be sent to your provider as well.   To learn more about what you can do with MyChart, go to [email or web address].    Your next appointment:   6 month(s)  The format for your next appointment:   In Person  Provider:   You may see Dr.Mealor   or one of the following Advanced Practice Providers on your designated Care Team:   Tommye Standard, [place] Legrand Como "Jonni Sanger" Chalmers Cater, [place]   Other Instructions   Important Information About Sugar

## 2022-11-11 ENCOUNTER — Telehealth: Payer: Self-pay | Admitting: Physician Assistant

## 2022-11-11 ENCOUNTER — Telehealth: Payer: Self-pay | Admitting: Cardiovascular Disease

## 2022-11-11 NOTE — Telephone Encounter (Signed)
Pt c/o Shortness Of Breath: STAT if SOB developed within the last 24 hours or pt is noticeably SOB on the phone  1. Are you currently SOB (can you hear that pt is SOB on the phone)?   No  2. How long have you been experiencing SOB?  Three weeks  3. Are you SOB when sitting or when up moving around?   Mostly when moving around  4. Are you currently experiencing any other symptoms?     Patient stated when he walks short distance he gets SOB. Patient stated his rib cage is sore when he tries to take deep breath when he sits down after walking.

## 2022-11-11 NOTE — Telephone Encounter (Signed)
  Pt would like to send another message to the nurse, since Dr. Myles Gip is sick his appt was r/s next week with Joseph Art, he is asking if another doctor can see him or what he needs to do while waiting for his appt.

## 2022-11-11 NOTE — Telephone Encounter (Addendum)
Returned call to patient who states since being in the office 11/1 his SOB has continued to get worse.  Feels like it has to do with the adjustments made to his device but no programing changes were made.  He is a former patient of [name] and will following with Dr. Myles Gip.  I have added him on for Thursday to be seen by Dr. Myles Gip

## 2022-11-11 NOTE — Telephone Encounter (Signed)
Returned call to patient and moved his appointment up to Fri with Dr. Caryl Comes.  He understands if he starts feeling worse he will go to the ER.  He was appreciative of my call.

## 2022-11-13 ENCOUNTER — Ambulatory Visit: Payer: Medicare Other | Admitting: Cardiovascular Disease

## 2022-11-14 ENCOUNTER — Other Ambulatory Visit: Payer: Self-pay

## 2022-11-14 ENCOUNTER — Emergency Department (HOSPITAL_COMMUNITY): Payer: Medicare Other

## 2022-11-14 ENCOUNTER — Observation Stay (HOSPITAL_COMMUNITY): Payer: Medicare Other

## 2022-11-14 ENCOUNTER — Encounter (HOSPITAL_COMMUNITY): Payer: Self-pay

## 2022-11-14 ENCOUNTER — Observation Stay (HOSPITAL_COMMUNITY)
Admission: EM | Admit: 2022-11-14 | Discharge: 2022-11-15 | Disposition: A | Discharge status: Home / Self Care | Payer: Medicare Other | Attending: Family Medicine | Admitting: Family Medicine

## 2022-11-14 ENCOUNTER — Ambulatory Visit: Payer: Medicare Other | Attending: Cardiovascular Disease | Admitting: Internal Medicine

## 2022-11-14 ENCOUNTER — Encounter: Payer: Self-pay | Admitting: Internal Medicine

## 2022-11-14 VITALS — BP 98/64 | HR 82 | Ht 72.0 in

## 2022-11-14 DIAGNOSIS — Z1152 Encounter for screening for COVID-19: Secondary | ICD-10-CM | POA: Insufficient documentation

## 2022-11-14 DIAGNOSIS — D631 Anemia in chronic kidney disease: Secondary | ICD-10-CM | POA: Insufficient documentation

## 2022-11-14 DIAGNOSIS — Z7901 Long term (current) use of anticoagulants: Secondary | ICD-10-CM | POA: Insufficient documentation

## 2022-11-14 DIAGNOSIS — R911 Solitary pulmonary nodule: Secondary | ICD-10-CM | POA: Insufficient documentation

## 2022-11-14 DIAGNOSIS — Z6834 Body mass index (BMI) 34.0-34.9, adult: Secondary | ICD-10-CM | POA: Insufficient documentation

## 2022-11-14 DIAGNOSIS — I10 Essential (primary) hypertension: Secondary | ICD-10-CM | POA: Diagnosis present

## 2022-11-14 DIAGNOSIS — Z950 Presence of cardiac pacemaker: Secondary | ICD-10-CM | POA: Insufficient documentation

## 2022-11-14 DIAGNOSIS — N289 Disorder of kidney and ureter, unspecified: Secondary | ICD-10-CM | POA: Diagnosis not present

## 2022-11-14 DIAGNOSIS — I2510 Atherosclerotic heart disease of native coronary artery without angina pectoris: Secondary | ICD-10-CM | POA: Diagnosis not present

## 2022-11-14 DIAGNOSIS — J949 Pleural condition, unspecified: Secondary | ICD-10-CM | POA: Diagnosis not present

## 2022-11-14 DIAGNOSIS — I700 Atherosclerosis of aorta: Secondary | ICD-10-CM | POA: Diagnosis not present

## 2022-11-14 DIAGNOSIS — Z96653 Presence of artificial knee joint, bilateral: Secondary | ICD-10-CM | POA: Diagnosis not present

## 2022-11-14 DIAGNOSIS — D649 Anemia, unspecified: Secondary | ICD-10-CM | POA: Diagnosis not present

## 2022-11-14 DIAGNOSIS — R0602 Shortness of breath: Secondary | ICD-10-CM

## 2022-11-14 DIAGNOSIS — I4821 Permanent atrial fibrillation: Secondary | ICD-10-CM | POA: Diagnosis not present

## 2022-11-14 DIAGNOSIS — Z7984 Long term (current) use of oral hypoglycemic drugs: Secondary | ICD-10-CM | POA: Insufficient documentation

## 2022-11-14 DIAGNOSIS — I442 Atrioventricular block, complete: Secondary | ICD-10-CM | POA: Diagnosis not present

## 2022-11-14 DIAGNOSIS — I5043 Acute on chronic combined systolic (congestive) and diastolic (congestive) heart failure: Secondary | ICD-10-CM | POA: Diagnosis not present

## 2022-11-14 DIAGNOSIS — J90 Pleural effusion, not elsewhere classified: Secondary | ICD-10-CM | POA: Diagnosis not present

## 2022-11-14 DIAGNOSIS — Z85528 Personal history of other malignant neoplasm of kidney: Secondary | ICD-10-CM | POA: Insufficient documentation

## 2022-11-14 DIAGNOSIS — E669 Obesity, unspecified: Secondary | ICD-10-CM | POA: Diagnosis present

## 2022-11-14 DIAGNOSIS — I5022 Chronic systolic (congestive) heart failure: Secondary | ICD-10-CM | POA: Insufficient documentation

## 2022-11-14 DIAGNOSIS — C679 Malignant neoplasm of bladder, unspecified: Secondary | ICD-10-CM | POA: Insufficient documentation

## 2022-11-14 DIAGNOSIS — N184 Chronic kidney disease, stage 4 (severe): Secondary | ICD-10-CM | POA: Diagnosis not present

## 2022-11-14 DIAGNOSIS — E1122 Type 2 diabetes mellitus with diabetic chronic kidney disease: Secondary | ICD-10-CM | POA: Insufficient documentation

## 2022-11-14 DIAGNOSIS — Z79899 Other long term (current) drug therapy: Secondary | ICD-10-CM | POA: Insufficient documentation

## 2022-11-14 DIAGNOSIS — Z8546 Personal history of malignant neoplasm of prostate: Secondary | ICD-10-CM | POA: Diagnosis not present

## 2022-11-14 DIAGNOSIS — I130 Hypertensive heart and chronic kidney disease with heart failure and stage 1 through stage 4 chronic kidney disease, or unspecified chronic kidney disease: Secondary | ICD-10-CM | POA: Diagnosis not present

## 2022-11-14 DIAGNOSIS — N189 Chronic kidney disease, unspecified: Secondary | ICD-10-CM | POA: Insufficient documentation

## 2022-11-14 LAB — CBC WITH DIFFERENTIAL/PLATELET
Abs Immature Granulocytes: 0.03 10*3/uL (ref 0.00–0.07)
Basophils Absolute: 0 10*3/uL (ref 0.0–0.1)
Basophils Relative: 1 %
Eosinophils Absolute: 0.2 10*3/uL (ref 0.0–0.5)
Eosinophils Relative: 3 %
HCT: 27.9 % — ABNORMAL LOW (ref 39.0–52.0)
Hemoglobin: 8.5 g/dL — ABNORMAL LOW (ref 13.0–17.0)
Immature Granulocytes: 1 %
Lymphocytes Relative: 12 %
Lymphs Abs: 0.7 10*3/uL (ref 0.7–4.0)
MCH: 24.8 pg — ABNORMAL LOW (ref 26.0–34.0)
MCHC: 30.5 g/dL (ref 30.0–36.0)
MCV: 81.3 fL (ref 80.0–100.0)
Monocytes Absolute: 0.6 10*3/uL (ref 0.1–1.0)
Monocytes Relative: 10 %
Neutro Abs: 4.6 10*3/uL (ref 1.7–7.7)
Neutrophils Relative %: 73 %
Platelets: 307 10*3/uL (ref 150–400)
RBC: 3.43 MIL/uL — ABNORMAL LOW (ref 4.22–5.81)
RDW: 14.8 % (ref 11.5–15.5)
WBC: 6.1 10*3/uL (ref 4.0–10.5)
nRBC: 0 % (ref 0.0–0.2)

## 2022-11-14 LAB — BASIC METABOLIC PANEL
Anion gap: 10 (ref 5–15)
BUN: 32 mg/dL — ABNORMAL HIGH (ref 8–23)
CO2: 25 mmol/L (ref 22–32)
Calcium: 9 mg/dL (ref 8.9–10.3)
Chloride: 104 mmol/L (ref 98–111)
Creatinine, Ser: 2.39 mg/dL — ABNORMAL HIGH (ref 0.61–1.24)
GFR, Estimated: 27 mL/min — ABNORMAL LOW (ref >60)
Glucose, Bld: 110 mg/dL — ABNORMAL HIGH (ref 70–99)
Potassium: 4.4 mmol/L (ref 3.5–5.1)
Sodium: 139 mmol/L (ref 135–145)

## 2022-11-14 LAB — RESP PANEL BY RT-PCR (RSV, FLU A&B, COVID)  RVPGX2
Influenza A by PCR: NEGATIVE
Influenza B by PCR: NEGATIVE
Resp Syncytial Virus by PCR: NEGATIVE
SARS Coronavirus 2 by RT PCR: NEGATIVE

## 2022-11-14 LAB — PROTIME-INR
INR: 2.1 — ABNORMAL HIGH (ref 0.8–1.2)
Prothrombin Time: 23.3 seconds — ABNORMAL HIGH (ref 11.4–15.2)

## 2022-11-14 LAB — COMPREHENSIVE METABOLIC PANEL
ALT: 11 U/L (ref 0–44)
AST: 16 U/L (ref 15–41)
Albumin: 2.7 g/dL — ABNORMAL LOW (ref 3.5–5.0)
Alkaline Phosphatase: 82 U/L (ref 38–126)
Anion gap: 9 (ref 5–15)
BUN: 30 mg/dL — ABNORMAL HIGH (ref 8–23)
CO2: 26 mmol/L (ref 22–32)
Calcium: 8.8 mg/dL — ABNORMAL LOW (ref 8.9–10.3)
Chloride: 105 mmol/L (ref 98–111)
Creatinine, Ser: 2.44 mg/dL — ABNORMAL HIGH (ref 0.61–1.24)
GFR, Estimated: 26 mL/min — ABNORMAL LOW (ref >60)
Glucose, Bld: 127 mg/dL — ABNORMAL HIGH (ref 70–99)
Potassium: 4.3 mmol/L (ref 3.5–5.1)
Sodium: 140 mmol/L (ref 135–145)
Total Bilirubin: 0.6 mg/dL (ref 0.3–1.2)
Total Protein: 6.7 g/dL (ref 6.5–8.1)

## 2022-11-14 LAB — TROPONIN I (HIGH SENSITIVITY)
Troponin I (High Sensitivity): 7 ng/L (ref <18)
Troponin I (High Sensitivity): 8 ng/L (ref <18)

## 2022-11-14 LAB — MAGNESIUM: Magnesium: 2.2 mg/dL (ref 1.7–2.4)

## 2022-11-14 LAB — BRAIN NATRIURETIC PEPTIDE: B Natriuretic Peptide: 150.3 pg/mL — ABNORMAL HIGH (ref 0.0–100.0)

## 2022-11-14 LAB — GLUCOSE, CAPILLARY: Glucose-Capillary: 139 mg/dL — ABNORMAL HIGH (ref 70–99)

## 2022-11-14 MED ORDER — WARFARIN SODIUM 7.5 MG PO TABS
7.5000 mg | ORAL_TABLET | Freq: Once | ORAL | Status: AC
  Administered 2022-11-14: 7.5 mg via ORAL
  Filled 2022-11-14: qty 1

## 2022-11-14 MED ORDER — ONDANSETRON HCL 4 MG PO TABS: 4.0000 mg | ORAL_TABLET | Freq: Four times a day (QID) | ORAL | Status: DC | PRN

## 2022-11-14 MED ORDER — INSULIN ASPART 100 UNIT/ML IJ SOLN
0.0000 [IU] | Freq: Three times a day (TID) | INTRAMUSCULAR | Status: DC
  Administered 2022-11-15: 2 [IU] via SUBCUTANEOUS

## 2022-11-14 MED ORDER — CARVEDILOL 3.125 MG PO TABS
3.1250 mg | ORAL_TABLET | Freq: Two times a day (BID) | ORAL | Status: DC
  Administered 2022-11-14: 3.125 mg via ORAL
  Filled 2022-11-14: qty 1

## 2022-11-14 MED ORDER — WARFARIN - PHARMACIST DOSING INPATIENT: Freq: Every day | Status: DC

## 2022-11-14 MED ORDER — ACETAMINOPHEN 650 MG RE SUPP: 650.0000 mg | Freq: Four times a day (QID) | RECTAL | Status: DC | PRN

## 2022-11-14 MED ORDER — TIZANIDINE HCL 4 MG PO TABS: 2.0000 mg | ORAL_TABLET | Freq: Once | ORAL | Status: DC

## 2022-11-14 MED ORDER — ACETAMINOPHEN 325 MG PO TABS: 650.0000 mg | ORAL_TABLET | Freq: Four times a day (QID) | ORAL | Status: DC | PRN

## 2022-11-14 MED ORDER — ALLOPURINOL 300 MG PO TABS: 300.0000 mg | ORAL_TABLET | Freq: Every day | ORAL | Status: DC

## 2022-11-14 MED ORDER — INSULIN ASPART 100 UNIT/ML IJ SOLN: 0.0000 [IU] | Freq: Every day | INTRAMUSCULAR | Status: DC

## 2022-11-14 MED ORDER — FUROSEMIDE 10 MG/ML IJ SOLN
80.0000 mg | Freq: Two times a day (BID) | INTRAMUSCULAR | Status: DC
  Administered 2022-11-15: 80 mg via INTRAVENOUS
  Filled 2022-11-14: qty 8

## 2022-11-14 MED ORDER — ONDANSETRON HCL 4 MG/2ML IJ SOLN: 4.0000 mg | Freq: Four times a day (QID) | INTRAMUSCULAR | Status: DC | PRN

## 2022-11-14 MED ORDER — TIZANIDINE HCL 4 MG PO TABS: 2.0000 mg | ORAL_TABLET | Freq: Once | ORAL | Status: DC | PRN

## 2022-11-14 MED ORDER — FUROSEMIDE 10 MG/ML IJ SOLN
40.0000 mg | Freq: Once | INTRAMUSCULAR | Status: AC
Start: 2022-11-14 — End: 2022-11-14
  Administered 2022-11-14: 40 mg via INTRAVENOUS
  Filled 2022-11-14: qty 4

## 2022-11-14 NOTE — Assessment & Plan Note (Signed)
Cr close to baseline 1.7-2.2

## 2022-11-14 NOTE — Assessment & Plan Note (Signed)
Hgb stable relative to baseline, no bleeding

## 2022-11-14 NOTE — H&P (Signed)
History and Physical    Patient: [name] YTK:[id number] DOB: [birth year] DOA: 11/14/2022 DOS: the patient was seen and examined on 11/14/2022 PCP: Shon Baton, MD  Patient coming from: Home  Chief Complaint:  Chief Complaint  Patient presents with   Shortness of Breath       HPI:  [name] is an 81 y.o. M with sCHF EF now recovered, permAF on warfarin, CKD IV baseline 1.7-2.2, CHB s/p PPM, obesity, DM and HTN who presented to his Cardiologist's office for 3 weeks progressive dyspnea.  Hadn't been taking his medications, felt like his left leg was swelling and he was more dyspneic with exertion.  Sent from clinic to the ER.  In the ER, CXR showed large right effusion.  Admitted for thoracentesis.      ROS   Past Medical History:  Diagnosis Date   Arthritis    CHB (complete heart block) (H[facility])    patient has permanent pacemaker   Chronic anticoagulation    CKD (chronic kidney disease), stage III ([place])    per hx in OV note from Dr. Shon Baton, 08/06/21   Depression    Diabetes mellitus without complication ([place])    Dyspnea    Gout    History of echocardiogram    Echo 9/16:  EF 55-60%, no RWMA, mild MR, mild LAE, trivial PI   History of kidney stones    HOH (hard of hearing)    wears hearing aids   HTN (hypertension)    LV dysfunction    EF down to 30% per echo Feb 2013 -  started on ARB and low dose beta blocker   Morbid obesity ([place])    Nephrolithiasis    Occasional tremors 10/08/2021   Pt states that he has tremors in his hands, and that his doctor told him it was from old age.   Osteoarthritis    Pacemaker 11/17/2008   Permanent atrial fibrillation ([place])    failed cardioversion in April of 2010   Prostate CA [facility])    Thrombophilia [facility])    Urosepsis    Wears glasses    reading only   Wears partial dentures    Past Surgical History:  Procedure Laterality Date   CARDIOVERSION  02/15/2009   failed   CYSTOSCOPY W/ URETERAL STENT PLACEMENT Left  12/25/2021   Procedure: CYSTOSCOPY WITH RETROGRADE PYELOGRAM/URETERAL STENT PLACEMENT;  Surgeon: Alexis Frock, MD;  [facility];  Service: Urology;  Laterality: Left;   CYSTOSCOPY WITH RETROGRADE PYELOGRAM, URETEROSCOPY AND STENT PLACEMENT Left 10/14/2021   Procedure: CYSTOSCOPY WITH RETROGRADE PYELOGRAM, URETEROSCOPY WITH  BIOPSY  AND STENT PLACEMENT;  Surgeon: Franchot Gallo, MD;  Location: [facility];  Service: Urology;  Laterality: Left;   EP IMPLANTABLE DEVICE N/A 08/17/2015   Procedure: PPM Generator Changeout;  Surgeon: Thompson Grayer, MD;  Location: [place] [facility];  Service: Cardiovascular;  Laterality: N/A;   PACEMAKER PLACEMENT  11/17/2008   Medtronic   RADIOACTIVE SEED IMPLANT     1990's ?   ROBOT ASSITED LAPAROSCOPIC NEPHROURETERECTOMY Left 12/25/2021   Procedure: XI ROBOT ASSITED LAPAROSCOPIC NEPHROURETERECTOMY, umblical hernia repair;  Surgeon: Alexis Frock, MD;  [facility];  Service: Urology;  Laterality: Left;   SHOULDER SURGERY Left    pt unsure of date   TOTAL KNEE ARTHROPLASTY Left    many years ago per pat   TOTAL KNEE ARTHROPLASTY Right 05/14/2021   Procedure: TOTAL KNEE ARTHROPLASTY;  Surgeon: Paralee Cancel, MD;  [facility];  Service: Orthopedics;  Laterality: Right;   WISDOM TOOTH EXTRACTION     Social History:  reports that he has never smoked. He has never used smokeless tobacco. He reports that he does not drink alcohol and does not use drugs.  Allergies  Allergen Reactions   Codeine     "Crazy reaction" per Dr. Jenny Reichmann Russo's history 08/06/21   Oxycodone Other (See Comments)    Makes feel funny   Sertraline Hcl Other (See Comments)    Makes feel funny   Sitagliptin Rash    Family History  Problem Relation Age of Onset   Heart attack Father 49   Healthy Mother    Healthy Brother     Prior to Admission medications   Medication Sig Start Date End Date Taking? Authorizing Provider  allopurinol (ZYLOPRIM) 300 MG  tablet TAKE ONE TABLET BY MOUTH ONCE DAILY 07/02/15   Nahser, Wonda Cheng, MD  carvedilol (COREG) 3.125 MG tablet Take 1 tablet (3.125 mg total) by mouth 2 (two) times daily. Please schedule overdue appt for future refills. 2nd attempt. 12/22/18   Allred, Jeneen Rinks, MD  dupilumab (DUPIXENT) 200 MG/1.14ML prefilled syringe Inject 200 mg into the skin every 14 (fourteen) days.    [provider]  furosemide (LASIX) 20 MG tablet Take 20 mg by mouth as needed for edema.    [provider]  losartan (COZAAR) 50 MG tablet TAKE 1 TABLET BY MOUTH ONCE DAILY.  PLEASE MAKE APPT WITH DR. ALLRED BEFORE ANYMORE REFILLS. 10/01/20   Allred, Jeneen Rinks, MD  metFORMIN (GLUCOPHAGE) 500 MG tablet Take 500 mg by mouth at bedtime. 04/20/15   [provider]  methocarbamol (ROBAXIN) 500 MG tablet Take 1 tablet (500 mg total) by mouth every 6 (six) hours as needed for muscle spasms. 05/15/21   Irving Copas, PA-C  triamcinolone cream (KENALOG) 0.1 % Apply 1 application topically as needed (rash). 09/07/17   [provider]  warfarin (COUMADIN) 5 MG tablet Take 1.5 tablets (7.5 mg total) by mouth daily. Take 7.5 mg by mouth daily 12/30/21   Bishop Limbo, MD    Physical Exam: Vitals:   11/14/22 1545 11/14/22 1600 11/14/22 1615 11/14/22 1630  BP: 117/64 100/67 114/67 118/68  Pulse: 71 73 70 73  Resp: '17 18 20 20  '$ Temp:      TempSrc:      SpO2: 98% 98% 97% 97%  Weight:      Height:       Elderly adult male, lying in bed, appears weak and tired RRR, no murmurs, trace left lower extremity pitting edema with chronic venous stasis change, no pitting edema of the right. Respiratory effort seems labored with exertion, completely diminished on the right, no rales or wheezing at all Abdomen soft no tenderness palpation or guarding, no ascites or distention Attention normal, affect appropriate, judgment insight appear normal, he seems to have generalized weakness, but symmetric strength, speech  fluent, face symmetric    Data Reviewed: Comprehensive metabolic panel notable for stable chronic renal insufficiency, normal LFTs Hemogram notable for anemia of chronic disease, hemoglobin 8.5, stable relative to baseline Troponin negative BNP 150 Personally reviewed chest x-ray shows right-sided opacity, appears to be a large effusion.         Assessment and Plan: * Pleural effusion - Obtain thoracentesis, send for LDH, protein, cytology, culture - Obtain 2V CXR    Anemia in chronic kidney disease (CKD) Hgb stable relative to baseline, no bleeding  CKD (chronic kidney disease), stage IV (  [place]) Cr close to baseline 1.9-3.7  Chronic systolic heart failure (HCC) Dysnpeic from effusion, not CHF - IV Lasix once - Continue home antihypertensives - Daily BMP  Pacemaker-Medtronic    Complete heart block (HCC) Has PPM  Permanent atrial fibrillation (HCC) - Continue warfarin - Check INR - Continue rate control agents  Essential hypertension, benign BP controlled - Continue home antihypertensives  Obesity (BMI 30-39.9) BMI 34         Advance Care Planning: Full code, confirmed with patient  Consults: None  Family Communication: None present  Severity of Illness: The appropriate patient status for this patient is OBSERVATION. Observation status is judged to be reasonable and necessary in order to provide the required intensity of service to ensure the patient's safety. The patient's presenting symptoms, physical exam findings, and initial radiographic and laboratory data in the context of their medical condition is felt to place them at decreased risk for further clinical deterioration. Furthermore, it is anticipated that the patient will be medically stable for discharge from the hospital within 2 midnights of admission.   Author: Edwin Dada, MD 11/14/2022 6:22 PM  For on call review [email or web address].

## 2022-11-14 NOTE — ED Provider Notes (Signed)
[facility] EMERGENCY DEPARTMENT Provider Note   CSN: [id number] Arrival date & time: 11/14/22  1508     History  Chief Complaint  Patient presents with   Shortness of Breath    [name] is a 81 y.o. male. With past medical history of hypertension, atrial fibrillation anticoagulated on warfarin, complete heart block s/p pacemaker placement, HF, DM, HTN, CKD III who presents to the emergency department with shortness of breath.  States that he has been short of breath over the past 2-3 weeks. He states when he is seated this improves his SOB but as soon as he stands to ambulate he becomes significant dyspneic. Having worsening lower extremity swelling. Has been sleeping in a recliner which is not new. Not on home O2. He states he has not been taking his Lasix over the past few weeks. Denies chest pain or palpitations, cough or fever, syncope, lightheadedness or dizziness.  Was seen today by Dr. Caryl Comes, cardiology who had concern for decreased right lung sounds, volume overload and wanting admission.   HPI     Home Medications Prior to Admission medications   Medication Sig Start Date End Date Taking? Authorizing Provider  allopurinol (ZYLOPRIM) 300 MG tablet TAKE ONE TABLET BY MOUTH ONCE DAILY 07/02/15   Nahser, Wonda Cheng, MD  carvedilol (COREG) 3.125 MG tablet Take 1 tablet (3.125 mg total) by mouth 2 (two) times daily. Please schedule overdue appt for future refills. 2nd attempt. 12/22/18   Allred, Jeneen Rinks, MD  dupilumab (DUPIXENT) 200 MG/1.14ML prefilled syringe Inject 200 mg into the skin every 14 (fourteen) days.    [provider]  furosemide (LASIX) 20 MG tablet Take 20 mg by mouth as needed for edema.    [provider]  losartan (COZAAR) 50 MG tablet TAKE 1 TABLET BY MOUTH ONCE DAILY.  PLEASE MAKE APPT WITH DR. ALLRED BEFORE ANYMORE REFILLS. 10/01/20   Allred, Jeneen Rinks, MD  metFORMIN (GLUCOPHAGE) 500 MG tablet Take 500 mg by mouth at bedtime.  04/20/15   [provider]  methocarbamol (ROBAXIN) 500 MG tablet Take 1 tablet (500 mg total) by mouth every 6 (six) hours as needed for muscle spasms. 05/15/21   Irving Copas, PA-C  triamcinolone cream (KENALOG) 0.1 % Apply 1 application topically as needed (rash). 09/07/17   [provider]  warfarin (COUMADIN) 5 MG tablet Take 1.5 tablets (7.5 mg total) by mouth daily. Take 7.5 mg by mouth daily 12/30/21   Bishop Limbo, MD      Allergies    Codeine, Oxycodone, Sertraline hcl, and Sitagliptin    Review of Systems   Review of Systems  Respiratory:  Positive for shortness of breath.   Cardiovascular:  Positive for leg swelling.  All other systems reviewed and are negative.   Physical Exam Updated Vital Signs BP 118/68   Pulse 73   Temp 97.8 F (36.6 C) (Oral)   Resp 20   Ht 6' (1.829 m)   Wt 116.1 kg   SpO2 97%   BMI 34.71 kg/m  Physical Exam Vitals and nursing note reviewed.  Constitutional:      General: He is not in acute distress.    Appearance: Normal appearance. He is ill-appearing.  HENT:     Head: Normocephalic.  Eyes:     General: No scleral icterus.    Extraocular Movements: Extraocular movements intact.  Cardiovascular:     Rate and Rhythm: Normal rate.     Pulses:  Radial pulses are 2+ on the right side and 2+ on the left side.     Heart sounds: No murmur heard.    Comments: Paced rhythm  Pulmonary:     Effort: Pulmonary effort is normal. Tachypnea present. No accessory muscle usage or respiratory distress.     Breath sounds: Examination of the right-upper field reveals decreased breath sounds. Examination of the right-middle field reveals decreased breath sounds. Examination of the right-lower field reveals decreased breath sounds. Examination of the left-lower field reveals rales. Decreased breath sounds and rales present. No wheezing or rhonchi.     Comments: RLL sounds absent, decreased and rhonchi in RML, moderate air  movement in the RUL Chest:     Chest wall: No tenderness.  Abdominal:     General: Bowel sounds are normal.     Palpations: Abdomen is soft.  Musculoskeletal:     Cervical back: Normal range of motion.     Right lower leg: Tenderness present. 2+ Pitting Edema present.     Left lower leg: Tenderness present. 2+ Pitting Edema present.  Skin:    General: Skin is warm and dry.     Capillary Refill: Capillary refill takes less than 2 seconds.  Neurological:     General: No focal deficit present.     Mental Status: He is alert and oriented to person, place, and time.  Psychiatric:        Mood and Affect: Mood normal.        Behavior: Behavior normal.     ED Results / Procedures / Treatments   Labs (all labs ordered are listed, but only abnormal results are displayed) Labs Reviewed  COMPREHENSIVE METABOLIC PANEL - Abnormal; Notable for the following components:      Result Value   Glucose, Bld 127 (*)    BUN 30 (*)    Creatinine, Ser 2.44 (*)    Calcium 8.8 (*)    Albumin 2.7 (*)    GFR, Estimated 26 (*)    All other components within normal limits  CBC WITH DIFFERENTIAL/PLATELET - Abnormal; Notable for the following components:   RBC 3.43 (*)    Hemoglobin 8.5 (*)    HCT 27.9 (*)    MCH 24.8 (*)    All other components within normal limits  BRAIN NATRIURETIC PEPTIDE - Abnormal; Notable for the following components:   B Natriuretic Peptide 150.3 (*)    All other components within normal limits  RESP PANEL BY RT-PCR (RSV, FLU A&B, COVID)  RVPGX2  PROTIME-INR  TROPONIN I (HIGH SENSITIVITY)  TROPONIN I (HIGH SENSITIVITY)    EKG EKG Interpretation  Date/Time:  Friday November 14 2022 15:15:38 EST Ventricular Rate:  84 PR Interval:    QRS Duration: 172 QT Interval:  423 QTC Calculation: 501 R Axis:   -80 Text Interpretation: IVCD likely V paced rhythm, without significant changes from prior ecg Sept 30 2016; no acute ischemic changes Confirmed by Octaviano Glow  [phone]) on 11/14/2022 3:20:28 PM  Radiology DG Chest Portable 1 View  Result Date: 11/14/2022 CLINICAL DATA:  Shortness of breath. EXAM: PORTABLE CHEST 1 VIEW COMPARISON:  December 18, 2008. FINDINGS: Stable cardiomediastinal silhouette. Left lung is clear. Right-sided pacemaker is noted. Interval development of mild to moderate right pleural effusion with associated atelectasis or infiltrate. Bony thorax is unremarkable. IMPRESSION: Mild to moderate right pleural effusion with associated right basilar atelectasis or infiltrate. Electronically Signed   By: Marijo Conception M.D.   On: 11/14/2022 16:08  Procedures Procedures    Medications Ordered in ED Medications  furosemide (LASIX) injection 40 mg (40 mg Intravenous Given 11/14/22 1718)    ED Course/ Medical Decision Making/ A&P Clinical Course as of 11/14/22 1724  Fri Nov 14, 2022  1542 81 yo male w/ CHF, IVCD, presenting from cardiology office (Dr Caryl Comes) with concern for volume overload, diminished breath sounds right lung.  He reports slowly progressing dyspnea on exertion for several days or weeks.  At rest he does not feel short of breath.  He is not hypoxic on room air.  He does have pulmonary edema notable in the left lung, diminished breath sounds in the right lung, and peripheral edema.  He is pending x-ray and labs. [MT]    Clinical Course User Index [MT] Trifan, Carola Rhine, MD                           Medical Decision Making Amount and/or Complexity of Data Reviewed Labs: ordered. Radiology: ordered.  Risk Prescription drug management. Decision regarding hospitalization.  Initial Impression and Ddx 81 year old male who presents to the emergency department with worsening shortness of breath over the past 3 weeks. Seen by cardiology today, noted to have decreased right lung sounds and sent here. Exam consistent with fluid overload. He is mildly tachypneic, diminished R lung sounds, BLE 2+ pitting edema. Oxygenating well  on room air. Patient PMH that increases complexity of ED encounter:  CHF, HTN, IVCD  Differential: ACS, PE, pneumothorax, pleural effusion, cardiac tamponade, aortic dissection, COPD exacerbation, pneumonia, asthma exacerbation, congestive heart failure, viral upper respiratory infection, medication side effect, anaphylaxis, etc.    Interpretation of Diagnostics I independent reviewed and interpreted the labs as followed: CMP Cr 2.44 consistent with chronic disease, BMP with stable anemia, BNP 150, troponin 7   - I independently visualized the following imaging with scope of interpretation limited to determining acute life threatening conditions related to emergency care: CXR, which revealed moderate R pleural effusion   Patient Reassessment and Ultimate Disposition/Management Reassessment, continues to have dyspnea without hypoxia.  He stood on the side of the bed with nursing and became significantly dyspneic, moving limited air without hypoxia.  They placed him on nonrebreather momentarily which seemed to improve his symptoms and symptoms improved further after sitting back in bed.Marland Kitchen He requires his head elevated.   He is fluid overloaded with BLE edema.  Notably, his BNP is only 150.  I am unable to see previous to compare.  Given 40 mg IV Lasix given that he has not taken this in weeks and clinically is volume overloaded. CXR with moderate R pleural effusion and ?infiltrate although I think this is likely atelectasis as he has been afebrile without recent cough, illness.  May need thoracentesis versus diuresis for this. EKG without ischemia or infarction, troponin is negative. Doubt ACS at this time.  CXR without PTX, PNA. Findings as above. Will likely need thoracentesis vs diuresis to resolve.  Symptoms inconsistent with tamponade, dissection, COPD or asthma exacerbation.  Consulted and spoke with cardiology who requests admission diuresis versus thoracentesis.  They are happy to follow along  if hospitalist wish. Consulted and spoke with Dr. Loleta Books, hospitalist who agrees to admit the patient.   Patient management required discussion with the following services or consulting groups:  Hospitalist Service and Cardiology  Complexity of Problems Addressed Acute complicated illness or Injury  Additional Data Reviewed and Analyzed Further history obtained from: EMS on arrival,  Prior ED visit notes, Recent PCP notes, Recent Consult notes, Care Everywhere, and Prior labs/imaging results  Patient Encounter Risk Assessment Minor Procedures, SDOH impact on management, and Consideration of hospitalization  Final Clinical Impression(s) / ED Diagnoses Final diagnoses:  Shortness of breath    Rx / DC Orders ED Discharge Orders     None         Mickie Hillier, PA-C 11/14/22 1735    Wyvonnia Dusky, MD 11/14/22 1754

## 2022-11-14 NOTE — Assessment & Plan Note (Signed)
BP controlled - Continue home antihypertensives 

## 2022-11-14 NOTE — Assessment & Plan Note (Signed)
-   Continue warfarin - Check INR - Continue rate control agents

## 2022-11-14 NOTE — ED Notes (Signed)
When standing pt to take his pants off the pt became so extremely SOB he was unable to catch his breath with assistance.  Pt placed on NRB for 5 mins to assist.  At no point did the pt saturation drop below 96% but did state the air made him feel better

## 2022-11-14 NOTE — Progress Notes (Signed)
ANTICOAGULATION CONSULT NOTE - Initial Consult  Pharmacy Consult for warfarin Indication: atrial fibrillation  Allergies  Allergen Reactions   Codeine     "Crazy reaction" per Dr. Jenny Reichmann Russo's history 08/06/21   Oxycodone Other (See Comments)    Makes feel funny   Sertraline Hcl Other (See Comments)    Makes feel funny   Sitagliptin Rash    Patient Measurements: Height: 6' (182.9 cm) Weight: 116.1 kg (255 lb 15.3 oz) IBW/kg (Calculated) : 77.6  Vital Signs: Temp: 97.8 F (36.6 C) (12/29 1520) Temp Source: Oral (12/29 1520) BP: 118/68 (12/29 1630) Pulse Rate: 73 (12/29 1630)  Labs: Recent Labs    11/14/22 1521  HGB 8.5*  HCT 27.9*  PLT 307  CREATININE 2.44*  TROPONINIHS 7    Estimated Creatinine Clearance: 31.8 mL/min (A) (by C-G formula based on SCr of 2.44 mg/dL (H)).   Medical History: Past Medical History:  Diagnosis Date   Arthritis    CHB (complete heart block) ([place])    patient has permanent pacemaker   Chronic anticoagulation    CKD (chronic kidney disease), stage III ([place])    per hx in OV note from Dr. Shon Baton, 08/06/21   Depression    Diabetes mellitus without complication ([place])    Dyspnea    Gout    History of echocardiogram    Echo 9/16:  EF 55-60%, no RWMA, mild MR, mild LAE, trivial PI   History of kidney stones    HOH (hard of hearing)    wears hearing aids   HTN (hypertension)    LV dysfunction    EF down to 30% per echo Feb 2013 -  started on ARB and low dose beta blocker   Morbid obesity ([place])    Nephrolithiasis    Occasional tremors 10/08/2021   Pt states that he has tremors in his hands, and that his doctor told him it was from old age.   Osteoarthritis    Pacemaker 11/17/2008   Permanent atrial fibrillation ([employer])    failed cardioversion in April of 2010   Prostate CA [facility])    Thrombophilia [facility])    Urosepsis    Wears glasses    reading only   Wears partial dentures     Assessment: 36 YOM presenting with worsening  dyspnea, hx afib on warfarin PTA with last dose 12/28, INR on admission is 2.1 therapeutic  PTA dosing: 7.'5mg'$  daily   Goal of Therapy:  INR 2-3 Monitor platelets by anticoagulation protocol: Yes   Plan:  Warfarin 7.'5mg'$  PO x 1 tonight Daily INR, s/s bleeding  Bertis Ruddy, PharmD, [place] Pharmacist ED Pharmacist Phone # [phone] 11/14/2022 9:14 PM

## 2022-11-14 NOTE — ED Triage Notes (Signed)
Coming from cardiology had a lapse of care non compliant with meds SOB x few days unable to walk due to SOB diminished right lung sounds for EMS alert and oriented.

## 2022-11-14 NOTE — ED Notes (Signed)
Patient transported to X-ray 

## 2022-11-14 NOTE — Assessment & Plan Note (Signed)
-   Obtain thoracentesis, send for LDH, protein, cytology, culture - Obtain 2V CXR

## 2022-11-14 NOTE — Progress Notes (Signed)
Patient Care Team: Shon Baton, MD as PCP - General (Internal Medicine) Thompson Grayer, MD as PCP - Electrophysiology (Cardiology)   HPI  [name] is a 81 y.o. male prior patient of Dr. Jackalyn Lombard with a history of permanent atrial fibrillation complete heart block with a previously implanted pacemaker Medtronic 2010 GEN change 2016  History of recovered cardiomyopathy  He presents now with 3 weeks of progressive dyspnea associated with right-sided pleuritic chest pain.  Significant edema, no chest pain.  DATE TEST EF   3/13 Echo   35-40 %   12/16 Echo   50-55 %              Date Cr K Hgb  2/23 2.21 4.0 9.6    9/23 2.8       Records and Results Reviewed   Past Medical History:  Diagnosis Date   Arthritis    CHB (complete heart block) ([place])    patient has permanent pacemaker   Chronic anticoagulation    CKD (chronic kidney disease), stage III ([place])    per hx in OV note from Dr. Shon Baton, 08/06/21   Depression    Diabetes mellitus without complication ([place])    Dyspnea    Gout    History of echocardiogram    Echo 9/16:  EF 55-60%, no RWMA, mild MR, mild LAE, trivial PI   History of kidney stones    HOH (hard of hearing)    wears hearing aids   HTN (hypertension)    LV dysfunction    EF down to 30% per echo Feb 2013 -  started on ARB and low dose beta blocker   Morbid obesity ([place])    Nephrolithiasis    Occasional tremors 10/08/2021   Pt states that he has tremors in his hands, and that his doctor told him it was from old age.   Osteoarthritis    Pacemaker 11/17/2008   Permanent atrial fibrillation ([place])    failed cardioversion in April of 2010   Prostate CA [facility])    Thrombophilia [facility])    Urosepsis    Wears glasses    reading only   Wears partial dentures     Past Surgical History:  Procedure Laterality Date   CARDIOVERSION  02/15/2009   failed   CYSTOSCOPY W/ URETERAL STENT PLACEMENT Left 12/25/2021   Procedure: CYSTOSCOPY WITH  RETROGRADE PYELOGRAM/URETERAL STENT PLACEMENT;  Surgeon: Alexis Frock, MD;  Location: [facility];  Service: Urology;  Laterality: Left;   CYSTOSCOPY WITH RETROGRADE PYELOGRAM, URETEROSCOPY AND STENT PLACEMENT Left 10/14/2021   Procedure: CYSTOSCOPY WITH RETROGRADE PYELOGRAM, URETEROSCOPY WITH  BIOPSY  AND STENT PLACEMENT;  Surgeon: Franchot Gallo, MD;  Location: [facility];  Service: Urology;  Laterality: Left;   EP IMPLANTABLE DEVICE N/A 08/17/2015   Procedure: PPM Generator Changeout;  Surgeon: Thompson Grayer, MD;  Location: [place] CV LAB;  Service: Cardiovascular;  Laterality: N/A;   PACEMAKER PLACEMENT  11/17/2008   Medtronic   RADIOACTIVE SEED IMPLANT     1990's ?   ROBOT ASSITED LAPAROSCOPIC NEPHROURETERECTOMY Left 12/25/2021   Procedure: XI ROBOT ASSITED LAPAROSCOPIC NEPHROURETERECTOMY, umblical hernia repair;  Surgeon: Alexis Frock, MD;  Location: [facility];  Service: Urology;  Laterality: Left;   SHOULDER SURGERY Left    pt unsure of date   TOTAL KNEE ARTHROPLASTY Left    many years ago per pat   TOTAL KNEE ARTHROPLASTY Right 05/14/2021   Procedure: TOTAL KNEE ARTHROPLASTY;  Surgeon: Paralee Cancel, MD;  Location: [facility];  Service: Orthopedics;  Laterality: Right;   WISDOM TOOTH EXTRACTION      Current Meds  Medication Sig   allopurinol (ZYLOPRIM) 300 MG tablet TAKE ONE TABLET BY MOUTH ONCE DAILY   carvedilol (COREG) 3.125 MG tablet Take 1 tablet (3.125 mg total) by mouth 2 (two) times daily. Please schedule overdue appt for future refills. 2nd attempt.   dupilumab (DUPIXENT) 200 MG/1.14ML prefilled syringe Inject 200 mg into the skin every 14 (fourteen) days.   furosemide (LASIX) 20 MG tablet Take 20 mg by mouth as needed for edema.   losartan (COZAAR) 50 MG tablet TAKE 1 TABLET BY MOUTH ONCE DAILY.  PLEASE MAKE APPT WITH DR. ALLRED BEFORE ANYMORE REFILLS.   metFORMIN (GLUCOPHAGE) 500 MG tablet Take 500 mg by mouth at bedtime.   methocarbamol (ROBAXIN) 500 MG  tablet Take 1 tablet (500 mg total) by mouth every 6 (six) hours as needed for muscle spasms.   triamcinolone cream (KENALOG) 0.1 % Apply 1 application topically as needed (rash).   warfarin (COUMADIN) 5 MG tablet Take 1.5 tablets (7.5 mg total) by mouth daily. Take 7.5 mg by mouth daily    Allergies  Allergen Reactions   Codeine     "Crazy reaction" per Dr. Jenny Reichmann Russo's history 08/06/21   Oxycodone Other (See Comments)    Makes feel funny   Sertraline Hcl Other (See Comments)    Makes feel funny   Sitagliptin Rash      Review of Systems negative except from HPI and PMH  Physical Exam BP 98/64   Pulse 82   Ht 6' (1.829 m)   SpO2 96%   BMI 34.72 kg/m  Well developed and well nourished in no acute distress HENT normal E scleral and icterus clear Neck Supple JVP flat; carotids brisk and full Clear left lung no breath sounds in the right lung  Regular rate and rhythm, no murmurs gallops or rub Soft with active bowel sounds No clubbing cyanosis 3+ Edema Alert and oriented, grossly normal motor and sensory function Skin Warm and Dry  ECG ventricular pacing at 79  CrCl cannot be calculated (Patient's most recent lab result is older than the maximum 21 days allowed.).   Assessment and  Plan  Complete heart block  Atrial fibrillation permanent  Renal insufficiency  Anemia  Congestive heart failure-/dyspnea acute on chronic  No breath sounds right lung  Cardiomyopathy previously recovered  The patient presents with progressive dyspnea with inability to ambulate.  He has a history of anemia renal insufficiency and a prior cardiomyopathy with last assessment 2016 at which point there have been interval recovery.  Examination is notable for significant peripheral edema, no breath sounds on the right lung.  Device function is normal.  Given symptoms, and the need for further evaluation I recommended admission.  Current medicines are reviewed at length with the  patient today .  The patient does not have concerns regarding medicines.

## 2022-11-14 NOTE — Assessment & Plan Note (Signed)
Dysnpeic from effusion, not CHF - IV Lasix once - Continue home antihypertensives - Daily BMP

## 2022-11-14 NOTE — Assessment & Plan Note (Signed)
Has PPM ?

## 2022-11-14 NOTE — Assessment & Plan Note (Signed)
BMI 34 

## 2022-11-14 NOTE — Hospital Course (Signed)
Mr. [name] is an 81 y.o. M with sCHF EF now recovered, permAF on warfarin, CKD IV baseline 1.7-2.2, CHB s/p PPM, obesity, DM and HTN who presented to his Cardiologist's office for 3 weeks progressive dyspnea.  Hadn't been taking his medications, felt like his left leg was swelling and he was more dyspneic with exertion.  Sent from clinic to the ER.  In the ER, CXR showed large right effusion.  Admitted for thoracentesis.

## 2022-11-15 ENCOUNTER — Observation Stay (HOSPITAL_BASED_OUTPATIENT_CLINIC_OR_DEPARTMENT_OTHER): Payer: Medicare Other

## 2022-11-15 ENCOUNTER — Observation Stay (HOSPITAL_COMMUNITY): Payer: Medicare Other

## 2022-11-15 DIAGNOSIS — M7989 Other specified soft tissue disorders: Secondary | ICD-10-CM | POA: Diagnosis not present

## 2022-11-15 DIAGNOSIS — R0609 Other forms of dyspnea: Secondary | ICD-10-CM

## 2022-11-15 DIAGNOSIS — R0602 Shortness of breath: Secondary | ICD-10-CM

## 2022-11-15 DIAGNOSIS — J90 Pleural effusion, not elsewhere classified: Secondary | ICD-10-CM | POA: Diagnosis not present

## 2022-11-15 LAB — HEMOGLOBIN A1C
Hgb A1c MFr Bld: 6.1 % — ABNORMAL HIGH (ref 4.8–5.6)
Mean Plasma Glucose: 128.37 mg/dL

## 2022-11-15 LAB — COMPREHENSIVE METABOLIC PANEL
ALT: 9 U/L (ref 0–44)
AST: 14 U/L — ABNORMAL LOW (ref 15–41)
Albumin: 2.8 g/dL — ABNORMAL LOW (ref 3.5–5.0)
Alkaline Phosphatase: 82 U/L (ref 38–126)
Anion gap: 7 (ref 5–15)
BUN: 30 mg/dL — ABNORMAL HIGH (ref 8–23)
CO2: 27 mmol/L (ref 22–32)
Calcium: 8.9 mg/dL (ref 8.9–10.3)
Chloride: 103 mmol/L (ref 98–111)
Creatinine, Ser: 2.34 mg/dL — ABNORMAL HIGH (ref 0.61–1.24)
GFR, Estimated: 27 mL/min — ABNORMAL LOW (ref >60)
Glucose, Bld: 107 mg/dL — ABNORMAL HIGH (ref 70–99)
Potassium: 4 mmol/L (ref 3.5–5.1)
Sodium: 137 mmol/L (ref 135–145)
Total Bilirubin: 0.4 mg/dL (ref 0.3–1.2)
Total Protein: 6.4 g/dL — ABNORMAL LOW (ref 6.5–8.1)

## 2022-11-15 LAB — GRAM STAIN

## 2022-11-15 LAB — CBC
HCT: 27.1 % — ABNORMAL LOW (ref 39.0–52.0)
Hemoglobin: 8.3 g/dL — ABNORMAL LOW (ref 13.0–17.0)
MCH: 24.3 pg — ABNORMAL LOW (ref 26.0–34.0)
MCHC: 30.6 g/dL (ref 30.0–36.0)
MCV: 79.5 fL — ABNORMAL LOW (ref 80.0–100.0)
Platelets: 318 10*3/uL (ref 150–400)
RBC: 3.41 MIL/uL — ABNORMAL LOW (ref 4.22–5.81)
RDW: 14.8 % (ref 11.5–15.5)
WBC: 5.9 10*3/uL (ref 4.0–10.5)
nRBC: 0 % (ref 0.0–0.2)

## 2022-11-15 LAB — BODY FLUID CELL COUNT WITH DIFFERENTIAL
Eos, Fluid: 1 %
Lymphs, Fluid: 52 %
Monocyte-Macrophage-Serous Fluid: 37 % — ABNORMAL LOW (ref 50–90)
Neutrophil Count, Fluid: 9 % (ref 0–25)
Other Cells, Fluid: 1 %
Total Nucleated Cell Count, Fluid: 278 cu mm (ref 0–1000)

## 2022-11-15 LAB — GLUCOSE, CAPILLARY
Glucose-Capillary: 129 mg/dL — ABNORMAL HIGH (ref 70–99)
Glucose-Capillary: 126 mg/dL — ABNORMAL HIGH (ref 70–99)

## 2022-11-15 LAB — PROTIME-INR
INR: 2.1 — ABNORMAL HIGH (ref 0.8–1.2)
Prothrombin Time: 23.7 seconds — ABNORMAL HIGH (ref 11.4–15.2)

## 2022-11-15 LAB — ECHOCARDIOGRAM COMPLETE
Height: 72 in
S' Lateral: 3.2 cm
Weight: 3943.59 oz

## 2022-11-15 LAB — LACTATE DEHYDROGENASE, PLEURAL OR PERITONEAL FLUID: LD, Fluid: 104 U/L — ABNORMAL HIGH (ref 3–23)

## 2022-11-15 LAB — PROTEIN, PLEURAL OR PERITONEAL FLUID: Total protein, fluid: 4.8 g/dL

## 2022-11-15 LAB — LACTATE DEHYDROGENASE: LDH: 108 U/L (ref 98–192)

## 2022-11-15 MED ORDER — WARFARIN SODIUM 7.5 MG PO TABS: 7.5000 mg | ORAL_TABLET | Freq: Once | ORAL | Status: DC

## 2022-11-15 MED ORDER — LIDOCAINE HCL (PF) 1 % IJ SOLN
INTRAMUSCULAR | Status: AC
  Filled 2022-11-15: qty 30

## 2022-11-15 NOTE — Discharge Summary (Signed)
Physician Discharge Summary   Patient: [name] MRN: [id number] DOB: Sep 17, 1941  Admit date:     11/14/2022  Discharge date: 11/15/22  Discharge Physician: Edwin Dada   PCP: Shon Baton, MD     Recommendations at discharge:  Follow up with Oncology, Dr. Marin Olp for new lung mass and history urothelial cancer Follow up with Urology Dr. Diona Fanti as needed Dr. 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 M with sCHF EF now recovered, permAF on warfarin, CKD IV baseline 1.7-2.2, urothelial CA s/p L nephrectomy by Dr. Tresa Moore in Feb 2023, CHB s/p PPM, obesity, DM and HTN who presented to his Cardiologist's office for 3 weeks progressive dyspnea.  Hadn't been taking his medications, felt like his left leg was swelling and he was more dyspneic with exertion.  Sent from clinic to the ER.  In the ER, CXR showed large right effusion.  Admitted for thoracentesis.     * Pleural effusion Thoracentesis obtained.  Dyspnea resolved after procedure.    Post thora x-ray showed residual opacity.  CT obtained showed pleural based mass and adrenal nodule.    Discussed with Dr. Marin Olp, and IR.  Has follow up arranged for discharge.  Cytology pending.    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  Op note describes "very large tumor obliterating the left renal pelvis".  Surg path describes urothelial carcinoma invading into peripelvic fat.  No nodes collected.  pT3 based on limited path.  I see no follow up after that.    Anemia in  chronic kidney disease (CKD) Hgb stable relative to baseline, no bleeding  CKD (chronic kidney disease), stage IV (HCC) Cr close to baseline 1.0-6.2  Chronic systolic heart failure (HCC) Dyspnea resolved after thoracentesis.    Complete heart block (HCC) Has PPM  Permanent atrial fibrillation (HCC) On warfarin  Essential hypertension, benign  Obesity (BMI 30-39.9) BMI 34            The [place] Controlled Substances Registry was reviewed for this patient prior to discharge.   Procedures performed: Thoracentesis, CT chest  Disposition: Home Diet recommendation:  Discharge Diet Orders (From admission, onward)     Start     Ordered   11/15/22 0000  Diet - low sodium heart healthy        11/15/22 1759             DISCHARGE MEDICATION: Allergies as of 11/15/2022       Reactions   Codeine    "Crazy reaction" per Dr. Jenny Reichmann Russo's history 08/06/21   Oxycodone Other (See Comments)   Makes feel funny   Sertraline Hcl Other (See Comments)   Makes feel funny   Sitagliptin Rash        Medication List     TAKE these medications    allopurinol 300 MG tablet Commonly known as: ZYLOPRIM TAKE ONE TABLET BY MOUTH ONCE DAILY   carvedilol 3.125 MG tablet Commonly known as: COREG Take 1 tablet (3.125 mg total) by mouth 2 (two) times  daily. Please schedule overdue appt for future refills. 2nd attempt. What changed: additional instructions   Dupixent 200 MG/1.14ML prefilled syringe Generic drug: dupilumab Inject 200 mg into the skin every 14 (fourteen) days.   furosemide 20 MG tablet Commonly known as: LASIX Take 20 mg by mouth daily as needed for edema.   losartan 50 MG tablet Commonly known as: COZAAR TAKE 1 TABLET BY MOUTH ONCE DAILY.  PLEASE MAKE APPT WITH DR. ALLRED BEFORE ANYMORE REFILLS. What changed: See the new instructions.   metFORMIN 500 MG tablet Commonly known as: GLUCOPHAGE Take 500 mg by mouth at bedtime.   methocarbamol 500  MG tablet Commonly known as: ROBAXIN Take 1 tablet (500 mg total) by mouth every 6 (six) hours as needed for muscle spasms.   triamcinolone cream 0.1 % Commonly known as: KENALOG Apply 1 application topically as needed (rash).   warfarin 5 MG tablet Commonly known as: COUMADIN Take 1.5 tablets (7.5 mg total) by mouth daily. Take 7.5 mg by mouth daily What changed: additional instructions        Follow-up Information     Volanda Napoleon, MD Follow up.   Specialty: Oncology Why: They will contact you for an appointment, call if you haven't heard from them in 1 week Contact information: [place] [address] [phone]         Franchot Gallo, MD. Call.   Specialty: Urology Contact information: [place] [address] [phone]         Shon Baton, MD Follow up.   Specialty: Internal Medicine Contact information: [address] [place] [address] [phone]                 Discharge Instructions     Diet - low sodium heart healthy   Complete by: As directed    Discharge instructions   Complete by: As directed    **IMPORTANT DISCHARGE INSTRUCTIONS**   From Dr. Loleta Books: You were admitted for a pleural effusion We did a "thoracentesis" to take the fluid off  Afterwards, we could still see something on the chest x-ray so we obtained a Chest CT scan  This showed a possible mass just outside the lung  You should follow up with Dr. Marin Olp from the [facility]  His office will contact you for follow up If you have questions or need to alter the appointment, call the number listed below  They will arrange a PET scan, or whatever follow up they feel appropriate  I will forward your discharge summary to Dr. Diona Fanti  Have your Mickle Plumb function checked in 1 week   Increase activity slowly   Complete by: As directed        Discharge Exam: Filed Weights   11/14/22 1532  11/15/22 0402  Weight: 116.1 kg 111.8 kg    General: Pt is alert, awake, not in acute distress Cardiovascular: RRR, nl S1-S2, no murmurs appreciated.   No LE edema.   Respiratory: Normal respiratory rate and rhythm.  CTAB without rales or wheezes. Abdominal: Abdomen soft and non-tender.  No distension or HSM.   Neuro/Psych: Strength symmetric in upper and lower extremities.  Judgment and insight appear normal.   Condition at discharge: good  The results of significant diagnostics from this hospitalization (including imaging, microbiology, ancillary and laboratory) are listed below for reference.   Imaging Studies: CT CHEST WO CONTRAST  Result Date: 11/15/2022 CLINICAL DATA:  82 year old male with pleural  effusion. EXAM: CT CHEST WITHOUT CONTRAST TECHNIQUE: Multidetector CT imaging of the chest was performed following the standard protocol without IV contrast. RADIATION DOSE REDUCTION: This exam was performed according to the departmental dose-optimization program which includes automated exposure control, adjustment of the mA and/or kV according to patient size and/or use of iterative reconstruction technique. COMPARISON:  11/15/2022 chest radiograph, 09/16/2021 abdominal and pelvic CT and prior studies. FINDINGS: Please note that parenchymal and vascular abnormalities may be missed as intravenous contrast was not administered. Cardiovascular: Heart size is within normal limits. Pacemaker leads are present. Coronary artery and aortic atherosclerotic calcifications are present. There is no evidence of thoracic aortic aneurysm or pericardial effusion. Mediastinum/Nodes: No enlarged mediastinal or axillary lymph nodes. Thyroid gland, trachea, and esophagus demonstrate no significant findings. Lungs/Pleura: A 3.6 x 2 cm soft tissue density along the anterior RIGHT UPPER lung appears to be extra pulmonary and likely pleural (images 43-50: Series 3). Probable focal RIGHT pleural thickening (83:3) is  also identified. A small to moderate RIGHT pleural effusion is noted with RIGHT LOWER lung atelectasis. A 7 mm LEFT LOWER lobe nodule (125:5) and a 4 mm LEFT LOWER lobe nodule (104:5) are present. Minimal LEFT basilar atelectasis noted. There is no evidence of pneumothorax or LEFT pleural effusion. Upper Abdomen: A 2.5 x 1.2 cm LEFT adrenal nodule is new from 09/16/2021. Musculoskeletal: No acute or suspicious bony abnormalities are noted. IMPRESSION: 1. 3.6 x 2 cm soft tissue along the anterior RIGHT UPPER lung, likely pleural. Focal RIGHT pleural thickening more inferiorly. Small to moderate RIGHT pleural effusion with RIGHT LOWER lung atelectasis. This is suspicious for malignancy/malignant effusion. 2. 2.5 x 1.2 cm LEFT adrenal nodule, new from 09/16/2021, possibly representing metastatic disease. 3. 7 mm LEFT LOWER lobe nodule and 4 mm LEFT LOWER lobe nodule. Indeterminate but may represent metastases. 4. PET-CT may be helpful for further evaluation of the above findings. 5. Coronary artery disease and Aortic Atherosclerosis (ICD10-I70.0). Electronically Signed   By: Margarette Canada M.D.   On: 11/15/2022 14:27   ECHOCARDIOGRAM COMPLETE  Result Date: 11/15/2022    ECHOCARDIOGRAM REPORT   Patient Name:   ARIE POWELL Date of Exam: 11/15/2022 Medical Rec #:  [id number]     Height:       72.0 in Accession #:    [id number]    Weight:       246.5 lb Date of Birth:  1941/01/12     BSA:          2.328 m Patient Age:    81 years      BP:           97/56 mmHg Patient Gender: M             HR:           75 bpm. Exam Location:  Inpatient Procedure: 2D Echo Indications:    dyspnea  History:        Patient has prior history of Echocardiogram examinations, most                 recent 11/15/2015. Pacemaker, chronic kidney disease,                 Arrythmias:Atrial Fibrillation; Risk Factors:Hypertension.  Sonographer:    Johny Chess RDCS Referring Phys: [id number] Tyee Vandevoorde P Shaden Higley  Sonographer Comments: Image  acquisition challenging due to respiratory motion. IMPRESSIONS  1. Left ventricular ejection fraction, by estimation, is 60 to 65%. The left ventricle has normal function. The  left ventricle has no regional wall motion abnormalities. Left ventricular diastolic parameters are indeterminate.  2. Right ventricular systolic function is normal. The right ventricular size is normal. There is normal pulmonary artery systolic pressure.  3. Left atrial size was mildly dilated.  4. The mitral valve is normal in structure. Mild mitral valve regurgitation. No evidence of mitral stenosis.  5. The aortic valve is tricuspid. There is mild calcification of the aortic valve. Aortic valve regurgitation is not visualized. Aortic valve sclerosis is present, with no evidence of aortic valve stenosis.  6. The inferior vena cava is normal in size with greater than 50% respiratory variability, suggesting right atrial pressure of 3 mmHg. FINDINGS  Left Ventricle: Left ventricular ejection fraction, by estimation, is 60 to 65%. The left ventricle has normal function. The left ventricle has no regional wall motion abnormalities. The left ventricular internal cavity size was normal in size. There is  no left ventricular hypertrophy. Left ventricular diastolic parameters are indeterminate. Right Ventricle: The right ventricular size is normal. No increase in right ventricular wall thickness. Right ventricular systolic function is normal. There is normal pulmonary artery systolic pressure. The tricuspid regurgitant velocity is 2.74 m/s, and  with an assumed right atrial pressure of 3 mmHg, the estimated right ventricular systolic pressure is 24.5 mmHg. Left Atrium: Left atrial size was mildly dilated. Right Atrium: Right atrial size was normal in size. Pericardium: There is no evidence of pericardial effusion. Mitral Valve: The mitral valve is normal in structure. Mild mitral valve regurgitation. No evidence of mitral valve stenosis. Tricuspid  Valve: The tricuspid valve is normal in structure. Tricuspid valve regurgitation is trivial. No evidence of tricuspid stenosis. Aortic Valve: The aortic valve is tricuspid. There is mild calcification of the aortic valve. Aortic valve regurgitation is not visualized. Aortic valve sclerosis is present, with no evidence of aortic valve stenosis. Pulmonic Valve: The pulmonic valve was normal in structure. Pulmonic valve regurgitation is trivial. No evidence of pulmonic stenosis. Aorta: The aortic root is normal in size and structure. Venous: The inferior vena cava is normal in size with greater than 50% respiratory variability, suggesting right atrial pressure of 3 mmHg. IAS/Shunts: No atrial level shunt detected by color flow Doppler. Additional Comments: A device lead is visualized in the right ventricle.  LEFT VENTRICLE PLAX 2D LVIDd:         4.50 cm LVIDs:         3.20 cm LV PW:         1.10 cm LV IVS:        0.80 cm LVOT diam:     2.30 cm LV SV:         82 LV SV Index:   35 LVOT Area:     4.15 cm  RIGHT VENTRICLE RV S prime:     13.80 cm/s TAPSE (M-mode): 1.8 cm LEFT ATRIUM             Index        RIGHT ATRIUM           Index LA diam:        4.30 cm 1.85 cm/m   RA Area:     20.70 cm LA Vol (A2C):   88.1 ml 37.84 ml/m  RA Volume:   59.40 ml  25.51 ml/m LA Vol (A4C):   72.6 ml 31.18 ml/m LA Biplane Vol: 83.0 ml 35.65 ml/m  AORTIC VALVE LVOT Vmax:   110.00 cm/s LVOT Vmean:  73.100 cm/s LVOT  VTI:    0.197 m  AORTA Ao Root diam: 3.10 cm Ao Asc diam:  3.70 cm TRICUSPID VALVE TR Peak grad:   30.0 mmHg TR Vmax:        274.00 cm/s  SHUNTS Systemic VTI:  0.20 m Systemic Diam: 2.30 cm Candee Furbish MD Electronically signed by Candee Furbish MD Signature Date/Time: 11/15/2022/12:39:26 PM    Final    US THORACENTESIS ASP PLEURAL SPACE W/IMG GUIDE  Result Date: 11/15/2022 INDICATION: 81 year old male. History of CHF. admitted for shortness of breath. Found to have a right-sided pleural effusion. Request is for  therapeutic and diagnostic right-sided thoracentesis EXAM: ULTRASOUND GUIDED RIGHT-SIDED THERAPEUTIC AND DIAGNOSTIC THORACENTESIS MEDICATIONS: Lidocaine 1% 10 mL COMPLICATIONS: None immediate. PROCEDURE: An ultrasound guided thoracentesis was thoroughly discussed with the patient and questions answered. The benefits, risks, alternatives and complications were also discussed. The patient understands and wishes to proceed with the procedure. Written consent was obtained. Ultrasound was performed to localize and mark an adequate pocket of fluid in the right chest. The area was then prepped and draped in the normal sterile fashion. 1% Lidocaine was used for local anesthesia. Under ultrasound guidance a 6 Fr Safe-T-Centesis catheter was introduced. Thoracentesis was performed. The catheter was removed and a dressing applied. FINDINGS: A total of approximately 1.3 L of straw-colored fluid was removed. Samples were sent to the laboratory as requested by the clinical team. IMPRESSION: Successful ultrasound guided therapeutic and diagnostic right-sided thoracentesis yielding 1.3 L of pleural fluid. Read by: Rushie Nyhan, NP Electronically Signed   By: Ruthann Cancer M.D.   On: 11/15/2022 11:22   VAS [place] LOWER EXTREMITY VENOUS (DVT)  Result Date: 11/15/2022  Lower Venous DVT Study Patient Name:  MAXIMILIAN TALLO  Date of Exam:   11/15/2022 Medical Rec #: [id number]      Accession #:    [id number] Date of Birth: 1940/12/07      Patient Gender: M Patient Age:   40 years Exam Location:  [facility] Procedure:      VAS [place] LOWER EXTREMITY VENOUS (DVT) Referring Phys: Myrene Buddy --------------------------------------------------------------------------------  Indications: Swelling.  Limitations: Poor ultrasound/tissue interfaceat calf level. Comparison Study: No prior studies. Performing Technologist: Darlin Coco RDMS, RVT  Examination Guidelines: A complete evaluation includes B-mode imaging, spectral  Doppler, color Doppler, and power Doppler as needed of all accessible portions of each vessel. Bilateral testing is considered an integral part of a complete examination. Limited examinations for reoccurring indications may be performed as noted. The reflux portion of the exam is performed with the patient in reverse Trendelenburg.  +-----+---------------+---------+-----------+----------+--------------+ RIGHTCompressibilityPhasicitySpontaneityPropertiesThrombus Aging +-----+---------------+---------+-----------+----------+--------------+ CFV  Full           Yes      Yes                                 +-----+---------------+---------+-----------+----------+--------------+   +---------+---------------+---------+-----------+----------+-------------------+ LEFT     CompressibilityPhasicitySpontaneityPropertiesThrombus Aging      +---------+---------------+---------+-----------+----------+-------------------+ CFV      Full           Yes      Yes                                      +---------+---------------+---------+-----------+----------+-------------------+ SFJ      Full                                                             +---------+---------------+---------+-----------+----------+-------------------+  FV Prox  Full                                                             +---------+---------------+---------+-----------+----------+-------------------+ FV Mid   Full                                                             +---------+---------------+---------+-----------+----------+-------------------+ FV DistalFull                                                             +---------+---------------+---------+-----------+----------+-------------------+ PFV      Full                                                             +---------+---------------+---------+-----------+----------+-------------------+ POP      Full           Yes       Yes                                      +---------+---------------+---------+-----------+----------+-------------------+ PTV      Full                                                             +---------+---------------+---------+-----------+----------+-------------------+ PERO     Full                                         Some segments not                                                         well visualized                                                           secondary to tissue  properties          +---------+---------------+---------+-----------+----------+-------------------+     Summary: RIGHT: - No evidence of common femoral vein obstruction.  LEFT: - There is no evidence of deep vein thrombosis in the lower extremity. However, portions of this examination were limited- see technologist comments above.  - No cystic structure found in the popliteal fossa.  *See table(s) above for measurements and observations.    Preliminary    DG CHEST PORT 1 VIEW  Result Date: 11/15/2022 CLINICAL DATA:  Status post thoracentesis EXAM: PORTABLE CHEST 1 VIEW COMPARISON:  November 14, 2022 FINDINGS: No pneumothorax after right thoracentesis. The right pleural effusion has improved but persists. Opacity underlies the effusion. No other interval changes. IMPRESSION: 1. No pneumothorax after right thoracentesis. 2. The right pleural effusion has improved but persists. Opacity underlies the effusion. 3. No other changes. Electronically Signed   By: Dorise Bullion III M.D.   On: 11/15/2022 10:30   DG Chest 2 View  Result Date: 11/14/2022 CLINICAL DATA:  Pleural effusion shortness of breath EXAM: CHEST - 2 VIEW COMPARISON:  11/14/2022, 12/18/2008 FINDINGS: Right-sided pacing device as before. Cardiomegaly with vascular congestion. Moderate right-sided pleural effusion with airspace disease at the right base. No  pneumothorax. IMPRESSION: Cardiomegaly with vascular congestion. Moderate right pleural effusion with airspace disease at the right base which may be due to atelectasis or pneumonia. Electronically Signed   By: Donavan Foil M.D.   On: 11/14/2022 19:11   DG Chest Portable 1 View  Result Date: 11/14/2022 CLINICAL DATA:  Shortness of breath. EXAM: PORTABLE CHEST 1 VIEW COMPARISON:  December 18, 2008. FINDINGS: Stable cardiomediastinal silhouette. Left lung is clear. Right-sided pacemaker is noted. Interval development of mild to moderate right pleural effusion with associated atelectasis or infiltrate. Bony thorax is unremarkable. IMPRESSION: Mild to moderate right pleural effusion with associated right basilar atelectasis or infiltrate. Electronically Signed   By: Marijo Conception M.D.   On: 11/14/2022 16:08    Microbiology: Results for orders placed or performed during the hospital encounter of 11/14/22  Resp panel by RT-PCR (RSV, Flu A&B, Covid) Anterior Nasal Swab     Status: None   Collection Time: 11/14/22  3:21 PM   Specimen: Anterior Nasal Swab  Result Value Ref Range Status   SARS Coronavirus 2 by RT PCR NEGATIVE NEGATIVE Final   Influenza A by PCR NEGATIVE NEGATIVE Final   Influenza B by PCR NEGATIVE NEGATIVE Final   Resp Syncytial Virus by PCR NEGATIVE NEGATIVE Final    Comment: Performed at [facility], [address], [place], [place] [address]    Labs: CBC: Recent Labs  Lab 11/14/22 1521 11/15/22 0247  WBC 6.1 5.9  NEUTROABS 4.6  --   HGB 8.5* 8.3*  HCT 27.9* 27.1*  MCV 81.3 79.5*  PLT 307 914   Basic Metabolic Panel: Recent Labs  Lab 11/14/22 1521 11/14/22 1939 11/15/22 0247  NA 140 139 137  K 4.3 4.4 4.0  CL 105 104 103  CO2 '26 25 27  '$ GLUCOSE 127* 110* 107*  BUN 30* 32* 30*  CREATININE 2.44* 2.39* 2.34*  CALCIUM 8.8* 9.0 8.9  MG  --  2.2  --    Liver Function Tests: Recent Labs  Lab 11/14/22 1521 11/15/22 0247  AST 16 14*  ALT 11 9  ALKPHOS  82 82  BILITOT 0.6 0.4  PROT 6.7 6.4*  ALBUMIN 2.7* 2.8*   CBG: Recent Labs  Lab 11/14/22 2101 11/15/22 0746 11/15/22  [place]    Discharge time spent: approximately 35 minutes spent on discharge counseling, evaluation of patient on day of discharge, and coordination of discharge planning with nursing, social work, pharmacy and case management  Signed: Edwin Dada, MD [facility] 11/15/2022

## 2022-11-15 NOTE — Procedures (Signed)
Ultrasound-guided diagnostic and therapeutic right sided thoracentesis performed yielding 1.3l iters of straw colored fluid. No immediate complications.   Diagnostic fluid was sent to the lab for further analysis. Follow-up chest x-ray pending. EBL is < 49m.

## 2022-11-15 NOTE — Plan of Care (Signed)
Goals met for d/c awaiting family arrival.

## 2022-11-15 NOTE — Progress Notes (Signed)
ANTICOAGULATION CONSULT NOTE - Follow Up Consult  Pharmacy Consult for warfarin Indication: atrial fibrillation  Allergies  Allergen Reactions   Codeine     "Crazy reaction" per Dr. Jenny Reichmann Russo's history 08/06/21   Oxycodone Other (See Comments)    Makes feel funny   Sertraline Hcl Other (See Comments)    Makes feel funny   Sitagliptin Rash    Patient Measurements: Height: 6' (182.9 cm) Weight: 111.8 kg (246 lb 7.6 oz) IBW/kg (Calculated) : 77.6   Vital Signs: Temp: 98.1 F (36.7 C) (12/30 0735) Temp Source: Oral (12/30 0735) BP: 97/56 (12/30 0735) Pulse Rate: 79 (12/30 0735)  Labs: Recent Labs    11/14/22 1521 11/14/22 1939 11/15/22 0247  HGB 8.5*  --  8.3*  HCT 27.9*  --  27.1*  PLT 307  --  318  LABPROT  --  23.3* 23.7*  INR  --  2.1* 2.1*  CREATININE 2.44* 2.39* 2.34*  TROPONINIHS 7 8  --     Estimated Creatinine Clearance: 32.5 mL/min (A) (by C-G formula based on SCr of 2.34 mg/dL (H)).   Medications:  Medications Prior to Admission  Medication Sig Dispense Refill Last Dose   carvedilol (COREG) 3.125 MG tablet Take 1 tablet (3.125 mg total) by mouth 2 (two) times daily. Please schedule overdue appt for future refills. 2nd attempt. (Patient taking differently: Take 3.125 mg by mouth 2 (two) times daily.) 30 tablet 0 11/13/2022 at 2300   dupilumab (DUPIXENT) 200 MG/1.14ML prefilled syringe Inject 200 mg into the skin every 14 (fourteen) days.   11/05/2022   furosemide (LASIX) 20 MG tablet Take 20 mg by mouth daily as needed for edema.   11/14/2022   losartan (COZAAR) 50 MG tablet TAKE 1 TABLET BY MOUTH ONCE DAILY.  PLEASE MAKE APPT WITH DR. ALLRED BEFORE ANYMORE REFILLS. (Patient taking differently: Take 50 mg by mouth daily.) 15 tablet 1 11/13/2022   metFORMIN (GLUCOPHAGE) 500 MG tablet Take 500 mg by mouth at bedtime.   11/13/2022   methocarbamol (ROBAXIN) 500 MG tablet Take 1 tablet (500 mg total) by mouth every 6 (six) hours as needed for muscle spasms. 40  tablet 0 Past Month   triamcinolone cream (KENALOG) 0.1 % Apply 1 application topically as needed (rash).   Past Week   warfarin (COUMADIN) 5 MG tablet Take 1.5 tablets (7.5 mg total) by mouth daily. Take 7.5 mg by mouth daily (Patient taking differently: Take 7.5 mg by mouth daily.)   11/13/2022 at 2300   allopurinol (ZYLOPRIM) 300 MG tablet TAKE ONE TABLET BY MOUTH ONCE DAILY (Patient not taking: Reported on 11/14/2022) 30 tablet 0 Not Taking    Assessment: 41 YOM presenting with worsening dyspnea, hx afib on warfarin PTA with last dose 12/28-no missed doses, INR remains therapeutic at 2.1. No bleeding noted  Home warfarin dose: 7.5 mg daily  Goal of Therapy:  INR 2-3 Monitor platelets by anticoagulation protocol: Yes   Plan:  Give warfarin 7.5 mg po x 1 Monitor daily INR, CBC, s/sx of bleed, PO intake/diet, Drug-Drug Interactions    Thank you for allowing [place] to participate in this patients care. Jens Som, PharmD 11/15/2022 9:43 AM  **Pharmacist phone directory can be found on [email or web address] listed under [place]**

## 2022-11-15 NOTE — Progress Notes (Signed)
Lower extremity venous left study completed.   Please see CV Proc for preliminary results.   Danella Philson, RDMS, RVT  

## 2022-11-15 NOTE — Progress Notes (Signed)
  Echocardiogram 2D Echocardiogram has been performed.  Johny Chess 11/15/2022, 11:39 AM

## 2022-11-18 ENCOUNTER — Encounter: Payer: Self-pay | Admitting: *Deleted

## 2022-11-18 NOTE — Progress Notes (Signed)
Called and spoke to patient regarding new patient referral. He would like to be seen at the [facility]. Referral forwarded there.   Oncology Nurse Navigator Documentation     11/18/2022   12:00 PM  Oncology Nurse Navigator Flowsheets  Navigator Location [place]  Referral Date to RadOnc/MedOnc 11/17/2022  Navigator Encounter Type Telephone  Telephone Outgoing Call  Barriers/Navigation Needs Coordination of Care  Interventions Coordination of Care  Acuity Level 1-No Barriers  Coordination of Care Appts  Time Spent with Patient 15

## 2022-11-19 ENCOUNTER — Encounter: Payer: Medicare Other | Admitting: Physician Assistant

## 2022-11-19 LAB — PATHOLOGIST SMEAR REVIEW: Path Review: REACTIVE

## 2022-11-20 LAB — CULTURE, BODY FLUID W GRAM STAIN -BOTTLE: Culture: NO GROWTH

## 2022-11-20 NOTE — Progress Notes (Signed)
This was the introductory phone call. I was able to reach pt at his mobile number. I introduced myself as the thoracic nurse navigator and explained my role. I offered the pt an appt tomorrow 1/5 at 10:00 with Dr.Mohamed. He declined and asked for an afternoon appointment. He accepted the offer of a 2:15 on 1/10. I explained that he will need lab work done 51mn prior to his appt. I explained that Dr MJulien Nordmannlikes to have a baseline set of labs in the event medication is needed. Pt verbalized understanding of date and times of his appts. He does have means of getting to the appointment and will be bringing a support person with him. He is familiar with the location of the clinic. He denied questions at the time of this phone call. I will see this pt at his initial med onc appt on 1/10.

## 2022-11-21 LAB — CYTOLOGY - NON PAP

## 2022-11-25 ENCOUNTER — Other Ambulatory Visit: Payer: Self-pay | Admitting: Medical Oncology

## 2022-11-25 DIAGNOSIS — C642 Malignant neoplasm of left kidney, except renal pelvis: Secondary | ICD-10-CM

## 2022-11-26 ENCOUNTER — Inpatient Hospital Stay: Payer: Medicare Other

## 2022-11-26 ENCOUNTER — Inpatient Hospital Stay: Payer: Medicare Other | Attending: Internal Medicine | Admitting: Internal Medicine

## 2022-11-26 ENCOUNTER — Other Ambulatory Visit: Payer: Self-pay

## 2022-11-26 VITALS — BP 105/68 | HR 78 | Temp 97.8°F | Resp 19

## 2022-11-26 DIAGNOSIS — C652 Malignant neoplasm of left renal pelvis: Secondary | ICD-10-CM | POA: Diagnosis not present

## 2022-11-26 DIAGNOSIS — J90 Pleural effusion, not elsewhere classified: Secondary | ICD-10-CM | POA: Diagnosis not present

## 2022-11-26 DIAGNOSIS — C642 Malignant neoplasm of left kidney, except renal pelvis: Secondary | ICD-10-CM

## 2022-11-26 LAB — CBC WITH DIFFERENTIAL (CANCER CENTER ONLY)
Abs Immature Granulocytes: 0.02 10*3/uL (ref 0.00–0.07)
Basophils Absolute: 0 10*3/uL (ref 0.0–0.1)
Basophils Relative: 0 %
Eosinophils Absolute: 0.2 10*3/uL (ref 0.0–0.5)
Eosinophils Relative: 3 %
HCT: 30.3 % — ABNORMAL LOW (ref 39.0–52.0)
Hemoglobin: 9.2 g/dL — ABNORMAL LOW (ref 13.0–17.0)
Immature Granulocytes: 0 %
Lymphocytes Relative: 14 %
Lymphs Abs: 1 10*3/uL (ref 0.7–4.0)
MCH: 24.1 pg — ABNORMAL LOW (ref 26.0–34.0)
MCHC: 30.4 g/dL (ref 30.0–36.0)
MCV: 79.3 fL — ABNORMAL LOW (ref 80.0–100.0)
Monocytes Absolute: 0.6 10*3/uL (ref 0.1–1.0)
Monocytes Relative: 8 %
Neutro Abs: 5.3 10*3/uL (ref 1.7–7.7)
Neutrophils Relative %: 75 %
Platelet Count: 342 10*3/uL (ref 150–400)
RBC: 3.82 MIL/uL — ABNORMAL LOW (ref 4.22–5.81)
RDW: 14.9 % (ref 11.5–15.5)
WBC Count: 7.1 10*3/uL (ref 4.0–10.5)
nRBC: 0 % (ref 0.0–0.2)

## 2022-11-26 LAB — CMP (CANCER CENTER ONLY)
ALT: 11 U/L (ref 0–44)
AST: 14 U/L — ABNORMAL LOW (ref 15–41)
Albumin: 3.2 g/dL — ABNORMAL LOW (ref 3.5–5.0)
Alkaline Phosphatase: 89 U/L (ref 38–126)
Anion gap: 9 (ref 5–15)
BUN: 45 mg/dL — ABNORMAL HIGH (ref 8–23)
CO2: 25 mmol/L (ref 22–32)
Calcium: 9.2 mg/dL (ref 8.9–10.3)
Chloride: 106 mmol/L (ref 98–111)
Creatinine: 2.44 mg/dL — ABNORMAL HIGH (ref 0.61–1.24)
GFR, Estimated: 26 mL/min — ABNORMAL LOW (ref >60)
Glucose, Bld: 121 mg/dL — ABNORMAL HIGH (ref 70–99)
Potassium: 4.4 mmol/L (ref 3.5–5.1)
Sodium: 140 mmol/L (ref 135–145)
Total Bilirubin: 0.4 mg/dL (ref 0.3–1.2)
Total Protein: 7.2 g/dL (ref 6.5–8.1)

## 2022-11-26 NOTE — Progress Notes (Signed)
[place] [phone]   [phone]  CONSULT NOTE  REFERRING PHYSICIAN: Dr. Shon Baton  REASON FOR CONSULTATION:  82 years old white male with metastatic urothelial carcinoma.  HPI [name] is a 82 y.o. male with past medical history significant for multiple medical problems including history of chronic kidney disease, diabetes mellitus, gout, hearing deficit, atrial fibrillation status post pacemaker placement, history of prostate cancer 30 years ago status post seed implants, hypertension as well as history of left nephrectomy for urothelial carcinoma of the renal pelvis under the care of Dr. Bess Harvest on December 25, 2021 when the patient presented with persistent hematuria.  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  He has been complaining of worsening dyspnea recently.  The patient presented to the emergency department on November 15, 2022 and he had CT scan of the chest without contrast and that showed 3.6 x 2.0 cm soft tissue along the anterior right upper lobe of the lung likely pleural.  There was also focal right pleural thickening more inferior the with a small to moderate right pleural effusion with right lower lobe lung atelectasis.  This is suspicious for malignancy/malignant pleural effusion.  There was also 2.5 x 1.2 cm left adrenal nodule new since the previous scan in October 2022 and likely metastatic disease.  Patient underwent ultrasound-guided right thoracentesis with drainage of 1.3 L of pleural fluid and the final cytology was consistent with metastatic urothelial carcinoma. The patient was referred to me today for evaluation and recommendation regarding treatment of his condition. When seen today he continues to have the shortness of breath as well as right-sided chest pain but no cough or hemoptysis.  He has no nausea, vomiting, diarrhea or  constipation.  He has no headache or visual changes.  He lost around 120 pounds in the last year. Family history significant for father and mother with heart disease. The patient is single and he had 2 children 1 deceased and 1 living with several grandchildren and great-grandchildren.  He was accompanied today by his girlfriend [name].  He used to work as a Administrator.  He has no history of smoking, alcohol or drug abuse.  HPI  Past Medical History:  Diagnosis Date   Arthritis    CHB (complete heart block) ([place])    patient has permanent pacemaker   Chronic anticoagulation    CKD (chronic kidney disease), stage III ([place])    per hx in OV note from Dr. Shon Baton, 08/06/21   Depression    Diabetes mellitus without complication ([place])    Dyspnea    Gout    History of echocardiogram    Echo 9/16:  EF 55-60%, no RWMA, mild MR, mild LAE, trivial PI   History of kidney stones    HOH (hard of hearing)    wears hearing aids   HTN (hypertension)    LV dysfunction    EF down to 30% per echo Feb 2013 -  started on ARB and low dose beta blocker   Morbid obesity ([place])    Nephrolithiasis    Occasional tremors 10/08/2021   Pt states that he has tremors in his hands, and that his doctor told him it was from old age.   Osteoarthritis    Pacemaker 11/17/2008   Permanent atrial fibrillation [facility])    failed cardioversion in April of 2010   Prostate CA [facility])  Thrombophilia ([place])    Urosepsis    Wears glasses    reading only   Wears partial dentures     Past Surgical History:  Procedure Laterality Date   CARDIOVERSION  02/15/2009   failed   CYSTOSCOPY W/ URETERAL STENT PLACEMENT Left 12/25/2021   Procedure: CYSTOSCOPY WITH RETROGRADE PYELOGRAM/URETERAL STENT PLACEMENT;  Surgeon: Alexis Frock, MD;  Location: [facility];  Service: Urology;  Laterality: Left;   CYSTOSCOPY WITH RETROGRADE PYELOGRAM, URETEROSCOPY AND STENT PLACEMENT Left 10/14/2021   Procedure: CYSTOSCOPY WITH RETROGRADE  PYELOGRAM, URETEROSCOPY WITH  BIOPSY  AND STENT PLACEMENT;  Surgeon: Franchot Gallo, MD;  Location[facility];  Service: Urology;  Laterality: Left;   EP IMPLANTABLE DEVICE N/A 08/17/2015   Procedure: PPM Generator Changeout;  Surgeon: Thompson Grayer, MD;  Location: [facility];  Service: Cardiovascular;  Laterality: N/A;   PACEMAKER PLACEMENT  11/17/2008   [employer]   RADIOACTIVE SEED IMPLANT     1990's ?   ROBOT ASSITED LAPAROSCOPIC NEPHROURETERECTOMY Left 12/25/2021   Procedure: XI ROBOT ASSITED LAPAROSCOPIC NEPHROURETERECTOMY, umblical hernia repair;  Surgeon: Alexis Frock, MD;  Location: [facility];  Service: Urology;  Laterality: Left;   SHOULDER SURGERY Left    pt unsure of date   TOTAL KNEE ARTHROPLASTY Left    many years ago per pat   TOTAL KNEE ARTHROPLASTY Right 05/14/2021   Procedure: TOTAL KNEE ARTHROPLASTY;  Surgeon: Paralee Cancel, MD;  Location: [facility];  Service: Orthopedics;  Laterality: Right;   WISDOM TOOTH EXTRACTION      Family History  Problem Relation Age of Onset   Heart attack Father 61   Healthy Mother    Healthy Brother     Social History Social History   Tobacco Use   Smoking status: Never   Smokeless tobacco: Never  Vaping Use   Vaping Use: Never used  Substance Use Topics   Alcohol use: No   Drug use: No    Allergies  Allergen Reactions   Codeine     "Crazy reaction" per Dr. Jenny Reichmann Russo's history 08/06/21   Oxycodone Other (See Comments)    Makes feel funny   Sertraline Hcl Other (See Comments)    Makes feel funny   Sitagliptin Rash    Current Outpatient Medications  Medication Sig Dispense Refill   allopurinol (ZYLOPRIM) 300 MG tablet TAKE ONE TABLET BY MOUTH ONCE DAILY (Patient not taking: Reported on 11/14/2022) 30 tablet 0   carvedilol (COREG) 3.125 MG tablet Take 1 tablet (3.125 mg total) by mouth 2 (two) times daily. Please schedule overdue appt for future refills. 2nd attempt. (Patient taking differently:  Take 3.125 mg by mouth 2 (two) times daily.) 30 tablet 0   dupilumab (DUPIXENT) 200 MG/1.14ML prefilled syringe Inject 200 mg into the skin every 14 (fourteen) days.     furosemide (LASIX) 20 MG tablet Take 20 mg by mouth daily as needed for edema.     losartan (COZAAR) 50 MG tablet TAKE 1 TABLET BY MOUTH ONCE DAILY.  PLEASE MAKE APPT WITH DR. ALLRED BEFORE ANYMORE REFILLS. (Patient taking differently: Take 50 mg by mouth daily.) 15 tablet 1   metFORMIN (GLUCOPHAGE) 500 MG tablet Take 500 mg by mouth at bedtime.     methocarbamol (ROBAXIN) 500 MG tablet Take 1 tablet (500 mg total) by mouth every 6 (six) hours as needed for muscle spasms. 40 tablet 0   triamcinolone cream (KENALOG) 0.1 % Apply 1 application topically as needed (rash).     warfarin (  COUMADIN) 5 MG tablet Take 1.5 tablets (7.5 mg total) by mouth daily. Take 7.5 mg by mouth daily (Patient taking differently: Take 7.5 mg by mouth daily.)     No current facility-administered medications for this visit.    Review of Systems  Constitutional: positive for fatigue and weight loss Eyes: negative Ears, nose, mouth, throat, and face: negative Respiratory: positive for dyspnea on exertion and pleurisy/chest pain Cardiovascular: negative Gastrointestinal: negative Genitourinary:negative Integument/breast: negative Hematologic/lymphatic: negative Musculoskeletal:negative Neurological: negative Behavioral/Psych: negative Endocrine: negative Allergic/Immunologic: negative  Physical Exam  PZW:CHENI, healthy, no distress, well nourished, and well developed SKIN: skin color, texture, turgor are normal, no rashes or significant lesions HEAD: Normocephalic, No masses, lesions, tenderness or abnormalities EYES: normal, PERRLA, Conjunctiva are pink and non-injected EARS: External ears normal, Canals clear OROPHARYNX:no exudate, no erythema, and lips, buccal mucosa, and tongue normal  NECK: supple, no adenopathy, no JVD LYMPH:  no  palpable lymphadenopathy, no hepatosplenomegaly LUNGS: coarse sounds heard, decreased breath sounds HEART: regular rate & rhythm, no murmurs, and no gallops ABDOMEN:abdomen soft, non-tender, obese, normal bowel sounds, and no masses or organomegaly BACK: Back symmetric, no curvature., No CVA tenderness EXTREMITIES:no joint deformities, effusion, or inflammation, no edema  NEURO: alert & oriented x 3 with fluent speech, no focal motor/sensory deficits  PERFORMANCE STATUS: ECOG 1  LABORATORY DATA: Lab Results  Component Value Date   WBC 7.1 11/26/2022   HGB 9.2 (L) 11/26/2022   HCT 30.3 (L) 11/26/2022   MCV 79.3 (L) 11/26/2022   PLT 342 11/26/2022      Chemistry      Component Value Date/Time   NA 137 11/15/2022 0247   NA 140 04/25/2021 1139   K 4.0 11/15/2022 0247   CL 103 11/15/2022 0247   CO2 27 11/15/2022 0247   BUN 30 (H) 11/15/2022 0247   BUN 31 (H) 04/25/2021 1139   CREATININE 2.34 (H) 11/15/2022 0247   CREATININE 1.11 02/10/2011 1613      Component Value Date/Time   CALCIUM 8.9 11/15/2022 0247   ALKPHOS 82 11/15/2022 0247   AST 14 (L) 11/15/2022 0247   ALT 9 11/15/2022 0247   BILITOT 0.4 11/15/2022 0247       RADIOGRAPHIC STUDIES: VAS [place] LOWER EXTREMITY VENOUS (DVT)  Result Date: 11/17/2022  Lower Venous DVT Study Patient Name:  EMERSEN CARROLL  Date of Exam:   11/15/2022 Medical Rec #: [id number]      Accession #:    [id number] Date of Birth: 1941-04-24      Patient Gender: M Patient Age:   54 years Exam Location:  [facility] Procedure:      VAS [place] LOWER EXTREMITY VENOUS (DVT) Referring Phys: Myrene Buddy --------------------------------------------------------------------------------  Indications: Swelling.  Limitations: Poor ultrasound/tissue interfaceat calf level. Comparison Study: No prior studies. Performing Technologist: Darlin Coco RDMS, RVT  Examination Guidelines: A complete evaluation includes B-mode imaging, spectral Doppler, color Doppler,  and power Doppler as needed of all accessible portions of each vessel. Bilateral testing is considered an integral part of a complete examination. Limited examinations for reoccurring indications may be performed as noted. The reflux portion of the exam is performed with the patient in reverse Trendelenburg.  +-----+---------------+---------+-----------+----------+--------------+ RIGHTCompressibilityPhasicitySpontaneityPropertiesThrombus Aging +-----+---------------+---------+-----------+----------+--------------+ CFV  Full           Yes      Yes                                 +-----+---------------+---------+-----------+----------+--------------+   +---------+---------------+---------+-----------+----------+-------------------+  LEFT     CompressibilityPhasicitySpontaneityPropertiesThrombus Aging      +---------+---------------+---------+-----------+----------+-------------------+ CFV      Full           Yes      Yes                                      +---------+---------------+---------+-----------+----------+-------------------+ SFJ      Full                                                             +---------+---------------+---------+-----------+----------+-------------------+ FV Prox  Full                                                             +---------+---------------+---------+-----------+----------+-------------------+ FV Mid   Full                                                             +---------+---------------+---------+-----------+----------+-------------------+ FV DistalFull                                                             +---------+---------------+---------+-----------+----------+-------------------+ PFV      Full                                                             +---------+---------------+---------+-----------+----------+-------------------+ POP      Full           Yes      Yes                                       +---------+---------------+---------+-----------+----------+-------------------+ PTV      Full                                                             +---------+---------------+---------+-----------+----------+-------------------+ PERO     Full                                         Some segments not  well visualized                                                           secondary to tissue                                                       properties          +---------+---------------+---------+-----------+----------+-------------------+     Summary: RIGHT: - No evidence of common femoral vein obstruction.  LEFT: - There is no evidence of deep vein thrombosis in the lower extremity. However, portions of this examination were limited- see technologist comments above.  - No cystic structure found in the popliteal fossa.  *See table(s) above for measurements and observations. Electronically signed by Jamelle Haring on 11/17/2022 at 10:13:03 AM.    Final    CT CHEST WO CONTRAST  Result Date: 11/15/2022 CLINICAL DATA:  82 year old male with pleural effusion. EXAM: CT CHEST WITHOUT CONTRAST TECHNIQUE: Multidetector CT imaging of the chest was performed following the standard protocol without IV contrast. RADIATION DOSE REDUCTION: This exam was performed according to the departmental dose-optimization program which includes automated exposure control, adjustment of the mA and/or kV according to patient size and/or use of iterative reconstruction technique. COMPARISON:  11/15/2022 chest radiograph, 09/16/2021 abdominal and pelvic CT and prior studies. FINDINGS: Please note that parenchymal and vascular abnormalities may be missed as intravenous contrast was not administered. Cardiovascular: Heart size is within normal limits. Pacemaker leads are present. Coronary artery and aortic atherosclerotic  calcifications are present. There is no evidence of thoracic aortic aneurysm or pericardial effusion. Mediastinum/Nodes: No enlarged mediastinal or axillary lymph nodes. Thyroid gland, trachea, and esophagus demonstrate no significant findings. Lungs/Pleura: A 3.6 x 2 cm soft tissue density along the anterior RIGHT UPPER lung appears to be extra pulmonary and likely pleural (images 43-50: Series 3). Probable focal RIGHT pleural thickening (83:3) is also identified. A small to moderate RIGHT pleural effusion is noted with RIGHT LOWER lung atelectasis. A 7 mm LEFT LOWER lobe nodule (125:5) and a 4 mm LEFT LOWER lobe nodule (104:5) are present. Minimal LEFT basilar atelectasis noted. There is no evidence of pneumothorax or LEFT pleural effusion. Upper Abdomen: A 2.5 x 1.2 cm LEFT adrenal nodule is new from 09/16/2021. Musculoskeletal: No acute or suspicious bony abnormalities are noted. IMPRESSION: 1. 3.6 x 2 cm soft tissue along the anterior RIGHT UPPER lung, likely pleural. Focal RIGHT pleural thickening more inferiorly. Small to moderate RIGHT pleural effusion with RIGHT LOWER lung atelectasis. This is suspicious for malignancy/malignant effusion. 2. 2.5 x 1.2 cm LEFT adrenal nodule, new from 09/16/2021, possibly representing metastatic disease. 3. 7 mm LEFT LOWER lobe nodule and 4 mm LEFT LOWER lobe nodule. Indeterminate but may represent metastases. 4. PET-CT may be helpful for further evaluation of the above findings. 5. Coronary artery disease and Aortic Atherosclerosis (ICD10-I70.0). Electronically Signed   By: Margarette Canada M.D.   On: 11/15/2022 14:27   ECHOCARDIOGRAM COMPLETE  Result Date: 11/15/2022    ECHOCARDIOGRAM REPORT   Patient Name:   KHALEEM BURCHILL Date of Exam: 11/15/2022 Medical Rec #:  [id number]  Height:       72.0 in Accession #:    [id number]    Weight:       246.5 lb Date of Birth:  May 11, 1941     BSA:          2.328 m Patient Age:    89 years      BP:           97/56 mmHg Patient Gender:  M             HR:           75 bpm. Exam Location:  Inpatient Procedure: 2D Echo Indications:    dyspnea  History:        Patient has prior history of Echocardiogram examinations, most                 recent 11/15/2015. Pacemaker, chronic kidney disease,                 Arrythmias:Atrial Fibrillation; Risk Factors:Hypertension.  Sonographer:    Johny Chess RDCS Referring Phys: [id number] CHRISTOPHER P DANFORD  Sonographer Comments: Image acquisition challenging due to respiratory motion. IMPRESSIONS  1. Left ventricular ejection fraction, by estimation, is 60 to 65%. The left ventricle has normal function. The left ventricle has no regional wall motion abnormalities. Left ventricular diastolic parameters are indeterminate.  2. Right ventricular systolic function is normal. The right ventricular size is normal. There is normal pulmonary artery systolic pressure.  3. Left atrial size was mildly dilated.  4. The mitral valve is normal in structure. Mild mitral valve regurgitation. No evidence of mitral stenosis.  5. The aortic valve is tricuspid. There is mild calcification of the aortic valve. Aortic valve regurgitation is not visualized. Aortic valve sclerosis is present, with no evidence of aortic valve stenosis.  6. The inferior vena cava is normal in size with greater than 50% respiratory variability, suggesting right atrial pressure of 3 mmHg. FINDINGS  Left Ventricle: Left ventricular ejection fraction, by estimation, is 60 to 65%. The left ventricle has normal function. The left ventricle has no regional wall motion abnormalities. The left ventricular internal cavity size was normal in size. There is  no left ventricular hypertrophy. Left ventricular diastolic parameters are indeterminate. Right Ventricle: The right ventricular size is normal. No increase in right ventricular wall thickness. Right ventricular systolic function is normal. There is normal pulmonary artery systolic pressure. The tricuspid  regurgitant velocity is 2.74 m/s, and  with an assumed right atrial pressure of 3 mmHg, the estimated right ventricular systolic pressure is 41.4 mmHg. Left Atrium: Left atrial size was mildly dilated. Right Atrium: Right atrial size was normal in size. Pericardium: There is no evidence of pericardial effusion. Mitral Valve: The mitral valve is normal in structure. Mild mitral valve regurgitation. No evidence of mitral valve stenosis. Tricuspid Valve: The tricuspid valve is normal in structure. Tricuspid valve regurgitation is trivial. No evidence of tricuspid stenosis. Aortic Valve: The aortic valve is tricuspid. There is mild calcification of the aortic valve. Aortic valve regurgitation is not visualized. Aortic valve sclerosis is present, with no evidence of aortic valve stenosis. Pulmonic Valve: The pulmonic valve was normal in structure. Pulmonic valve regurgitation is trivial. No evidence of pulmonic stenosis. Aorta: The aortic root is normal in size and structure. Venous: The inferior vena cava is normal in size with greater than 50% respiratory variability, suggesting right atrial pressure of 3 mmHg. IAS/Shunts: No atrial level shunt detected by color flow Doppler. Additional  Comments: A device lead is visualized in the right ventricle.  LEFT VENTRICLE PLAX 2D LVIDd:         4.50 cm LVIDs:         3.20 cm LV PW:         1.10 cm LV IVS:        0.80 cm LVOT diam:     2.30 cm LV SV:         82 LV SV Index:   35 LVOT Area:     4.15 cm  RIGHT VENTRICLE RV S prime:     13.80 cm/s TAPSE (M-mode): 1.8 cm LEFT ATRIUM             Index        RIGHT ATRIUM           Index LA diam:        4.30 cm 1.85 cm/m   RA Area:     20.70 cm LA Vol (A2C):   88.1 ml 37.84 ml/m  RA Volume:   59.40 ml  25.51 ml/m LA Vol (A4C):   72.6 ml 31.18 ml/m LA Biplane Vol: 83.0 ml 35.65 ml/m  AORTIC VALVE LVOT Vmax:   110.00 cm/s LVOT Vmean:  73.100 cm/s LVOT VTI:    0.197 m  AORTA Ao Root diam: 3.10 cm Ao Asc diam:  3.70 cm TRICUSPID  VALVE TR Peak grad:   30.0 mmHg TR Vmax:        274.00 cm/s  SHUNTS Systemic VTI:  0.20 m Systemic Diam: 2.30 cm Candee Furbish MD Electronically signed by Candee Furbish MD Signature Date/Time: 11/15/2022/12:39:26 PM    Final    US THORACENTESIS ASP PLEURAL SPACE W/IMG GUIDE  Result Date: 11/15/2022 INDICATION: 82 year old male. History of CHF. admitted for shortness of breath. Found to have a right-sided pleural effusion. Request is for therapeutic and diagnostic right-sided thoracentesis EXAM: ULTRASOUND GUIDED RIGHT-SIDED THERAPEUTIC AND DIAGNOSTIC THORACENTESIS MEDICATIONS: Lidocaine 1% 10 mL COMPLICATIONS: None immediate. PROCEDURE: An ultrasound guided thoracentesis was thoroughly discussed with the patient and questions answered. The benefits, risks, alternatives and complications were also discussed. The patient understands and wishes to proceed with the procedure. Written consent was obtained. Ultrasound was performed to localize and mark an adequate pocket of fluid in the right chest. The area was then prepped and draped in the normal sterile fashion. 1% Lidocaine was used for local anesthesia. Under ultrasound guidance a 6 Fr Safe-T-Centesis catheter was introduced. Thoracentesis was performed. The catheter was removed and a dressing applied. FINDINGS: A total of approximately 1.3 L of straw-colored fluid was removed. Samples were sent to the laboratory as requested by the clinical team. IMPRESSION: Successful ultrasound guided therapeutic and diagnostic right-sided thoracentesis yielding 1.3 L of pleural fluid. Read by: Rushie Nyhan, NP Electronically Signed   By: Ruthann Cancer M.D.   On: 11/15/2022 11:22   DG CHEST PORT 1 VIEW  Result Date: 11/15/2022 CLINICAL DATA:  Status post thoracentesis EXAM: PORTABLE CHEST 1 VIEW COMPARISON:  November 14, 2022 FINDINGS: No pneumothorax after right thoracentesis. The right pleural effusion has improved but persists. Opacity underlies the effusion. No  other interval changes. IMPRESSION: 1. No pneumothorax after right thoracentesis. 2. The right pleural effusion has improved but persists. Opacity underlies the effusion. 3. No other changes. Electronically Signed   By: Dorise Bullion III M.D.   On: 11/15/2022 10:30   DG Chest 2 View  Result Date: 11/14/2022 CLINICAL DATA:  Pleural effusion shortness of breath EXAM:  CHEST - 2 VIEW COMPARISON:  11/14/2022, 12/18/2008 FINDINGS: Right-sided pacing device as before. Cardiomegaly with vascular congestion. Moderate right-sided pleural effusion with airspace disease at the right base. No pneumothorax. IMPRESSION: Cardiomegaly with vascular congestion. Moderate right pleural effusion with airspace disease at the right base which may be due to atelectasis or pneumonia. Electronically Signed   By: Donavan Foil M.D.   On: 11/14/2022 19:11   DG Chest Portable 1 View  Result Date: 11/14/2022 CLINICAL DATA:  Shortness of breath. EXAM: PORTABLE CHEST 1 VIEW COMPARISON:  December 18, 2008. FINDINGS: Stable cardiomediastinal silhouette. Left lung is clear. Right-sided pacemaker is noted. Interval development of mild to moderate right pleural effusion with associated atelectasis or infiltrate. Bony thorax is unremarkable. IMPRESSION: Mild to moderate right pleural effusion with associated right basilar atelectasis or infiltrate. Electronically Signed   By: Marijo Conception M.D.   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  He was followed by observation and recently presented with worsening dyspnea and imaging studies including CT scan of the chest showed right upper lobe pleural mass in addition to malignant right pleural effusion that was consistent with urothelial  carcinoma diagnosed in December 2023.  The patient also had left adrenal gland mass suspicious for metastatic disease.   PLAN: I had a lengthy discussion with the patient and his girlfriend today about his current disease stage, prognosis and treatment options.  I personally and independently reviewed the scan images and discussed the result with the patient and his family member. I explained to the patient that he has incurable condition and all the treatment will be of palliative nature.   I gave the patient the option of palliative care and hospice versus consideration of palliative systemic chemotherapy with carboplatin and gemcitabine. The patient mentions that he has no interest and proceeding with systemic chemotherapy and he would prefer to proceed with hospice care at this point. For the shortness of breath and recurrent right pleural effusion he is in agreement with consideration of Pleurx catheter placement for drainage of this fluid and I will refer him to cardiothoracic surgery for this procedure. I will ask his primary care physician Dr. Virgina Jock to be the attending on the record for his palliative care and hospice since he knows the patient for many years compared to me who just saw him today. The patient was advised to call immediately if he changes his mind regarding future treatment or anything I can help him with. The patient voices understanding of current disease status and treatment options and is in agreement with the current care plan.  All questions were answered. The patient knows to call the clinic with any problems, questions or concerns. We can certainly see the patient much sooner if necessary.  Thank you so much for allowing me to participate in the care of [name]. I will continue to follow up the patient with you and assist in his care.  The total time spent in the appointment was 90 minutes.  Disclaimer: This note was dictated with voice recognition software.  Similar sounding words can inadvertently be transcribed and may not be corrected upon review.   Eilleen Kempf November 26, 2022, 3:00 PM

## 2022-11-27 ENCOUNTER — Other Ambulatory Visit: Payer: Self-pay

## 2022-11-27 DIAGNOSIS — J90 Pleural effusion, not elsewhere classified: Secondary | ICD-10-CM

## 2022-11-28 ENCOUNTER — Telehealth: Payer: Self-pay | Admitting: Medical Oncology

## 2022-11-28 ENCOUNTER — Telehealth: Payer: Self-pay

## 2022-11-28 NOTE — Telephone Encounter (Signed)
Patient contacted the office with complaints of shortness of breath. He is a new patient to Dr. Roxan Hockey who has an appt 1/18 for pleural effusion. Patient states that he is unable to walk to the bathroom and kitchen without getting really winded. Advised going to the emergency room for evaluation and possible thoracentesis. Patient acknowledged receipt.

## 2022-11-28 NOTE — Telephone Encounter (Signed)
LVM with Caryl Pina at [facility] re him being pts attending for Hospice .

## 2022-11-28 NOTE — Telephone Encounter (Signed)
Dr. Julien Nordmann wants his PCP to refer him to Hospice so that PCP will be provider of record . Called to [facility] hospice.

## 2022-11-29 ENCOUNTER — Other Ambulatory Visit: Payer: Self-pay

## 2022-11-29 ENCOUNTER — Encounter (HOSPITAL_COMMUNITY): Payer: Self-pay

## 2022-11-29 ENCOUNTER — Inpatient Hospital Stay (HOSPITAL_COMMUNITY)
Admission: EM | Admit: 2022-11-29 | Discharge: 2022-12-03 | DRG: 687 | Disposition: A | Discharge status: Hospice | Payer: Medicare Other | Attending: Internal Medicine | Admitting: Internal Medicine

## 2022-11-29 ENCOUNTER — Emergency Department (HOSPITAL_COMMUNITY): Payer: Medicare Other

## 2022-11-29 DIAGNOSIS — J910 Malignant pleural effusion: Secondary | ICD-10-CM | POA: Diagnosis present

## 2022-11-29 DIAGNOSIS — I10 Essential (primary) hypertension: Secondary | ICD-10-CM | POA: Diagnosis not present

## 2022-11-29 DIAGNOSIS — J90 Pleural effusion, not elsewhere classified: Secondary | ICD-10-CM

## 2022-11-29 DIAGNOSIS — N184 Chronic kidney disease, stage 4 (severe): Secondary | ICD-10-CM | POA: Diagnosis not present

## 2022-11-29 DIAGNOSIS — C652 Malignant neoplasm of left renal pelvis: Secondary | ICD-10-CM | POA: Diagnosis present

## 2022-11-29 DIAGNOSIS — I4821 Permanent atrial fibrillation: Secondary | ICD-10-CM | POA: Diagnosis present

## 2022-11-29 DIAGNOSIS — Z79899 Other long term (current) drug therapy: Secondary | ICD-10-CM

## 2022-11-29 DIAGNOSIS — I5022 Chronic systolic (congestive) heart failure: Secondary | ICD-10-CM | POA: Diagnosis present

## 2022-11-29 DIAGNOSIS — Z885 Allergy status to narcotic agent status: Secondary | ICD-10-CM

## 2022-11-29 DIAGNOSIS — Z6833 Body mass index (BMI) 33.0-33.9, adult: Secondary | ICD-10-CM

## 2022-11-29 DIAGNOSIS — R0600 Dyspnea, unspecified: Principal | ICD-10-CM

## 2022-11-29 DIAGNOSIS — Z8249 Family history of ischemic heart disease and other diseases of the circulatory system: Secondary | ICD-10-CM

## 2022-11-29 DIAGNOSIS — M109 Gout, unspecified: Secondary | ICD-10-CM | POA: Diagnosis present

## 2022-11-29 DIAGNOSIS — Z905 Acquired absence of kidney: Secondary | ICD-10-CM

## 2022-11-29 DIAGNOSIS — E1122 Type 2 diabetes mellitus with diabetic chronic kidney disease: Secondary | ICD-10-CM | POA: Diagnosis present

## 2022-11-29 DIAGNOSIS — Z950 Presence of cardiac pacemaker: Secondary | ICD-10-CM

## 2022-11-29 DIAGNOSIS — Z66 Do not resuscitate: Secondary | ICD-10-CM | POA: Diagnosis present

## 2022-11-29 DIAGNOSIS — I130 Hypertensive heart and chronic kidney disease with heart failure and stage 1 through stage 4 chronic kidney disease, or unspecified chronic kidney disease: Secondary | ICD-10-CM | POA: Diagnosis not present

## 2022-11-29 DIAGNOSIS — Z7901 Long term (current) use of anticoagulants: Secondary | ICD-10-CM

## 2022-11-29 DIAGNOSIS — Z7984 Long term (current) use of oral hypoglycemic drugs: Secondary | ICD-10-CM

## 2022-11-29 DIAGNOSIS — Z8546 Personal history of malignant neoplasm of prostate: Secondary | ICD-10-CM

## 2022-11-29 DIAGNOSIS — I442 Atrioventricular block, complete: Secondary | ICD-10-CM | POA: Diagnosis present

## 2022-11-29 DIAGNOSIS — Z888 Allergy status to other drugs, medicaments and biological substances status: Secondary | ICD-10-CM

## 2022-11-29 DIAGNOSIS — E669 Obesity, unspecified: Secondary | ICD-10-CM | POA: Diagnosis present

## 2022-11-29 DIAGNOSIS — Z96653 Presence of artificial knee joint, bilateral: Secondary | ICD-10-CM | POA: Diagnosis present

## 2022-11-29 LAB — COMPREHENSIVE METABOLIC PANEL
ALT: 10 U/L (ref 0–44)
AST: 15 U/L (ref 15–41)
Albumin: 2.9 g/dL — ABNORMAL LOW (ref 3.5–5.0)
Alkaline Phosphatase: 90 U/L (ref 38–126)
Anion gap: 9 (ref 5–15)
BUN: 31 mg/dL — ABNORMAL HIGH (ref 8–23)
CO2: 23 mmol/L (ref 22–32)
Calcium: 9.2 mg/dL (ref 8.9–10.3)
Chloride: 104 mmol/L (ref 98–111)
Creatinine, Ser: 2.06 mg/dL — ABNORMAL HIGH (ref 0.61–1.24)
GFR, Estimated: 32 mL/min — ABNORMAL LOW (ref >60)
Glucose, Bld: 126 mg/dL — ABNORMAL HIGH (ref 70–99)
Potassium: 4.3 mmol/L (ref 3.5–5.1)
Sodium: 136 mmol/L (ref 135–145)
Total Bilirubin: 0.4 mg/dL (ref 0.3–1.2)
Total Protein: 6.8 g/dL (ref 6.5–8.1)

## 2022-11-29 LAB — CBC WITH DIFFERENTIAL/PLATELET
Abs Immature Granulocytes: 0.04 10*3/uL (ref 0.00–0.07)
Basophils Absolute: 0.1 10*3/uL (ref 0.0–0.1)
Basophils Relative: 1 %
Eosinophils Absolute: 0.2 10*3/uL (ref 0.0–0.5)
Eosinophils Relative: 3 %
HCT: 30.3 % — ABNORMAL LOW (ref 39.0–52.0)
Hemoglobin: 9.4 g/dL — ABNORMAL LOW (ref 13.0–17.0)
Immature Granulocytes: 1 %
Lymphocytes Relative: 15 %
Lymphs Abs: 1.1 10*3/uL (ref 0.7–4.0)
MCH: 24.4 pg — ABNORMAL LOW (ref 26.0–34.0)
MCHC: 31 g/dL (ref 30.0–36.0)
MCV: 78.5 fL — ABNORMAL LOW (ref 80.0–100.0)
Monocytes Absolute: 0.7 10*3/uL (ref 0.1–1.0)
Monocytes Relative: 10 %
Neutro Abs: 5.1 10*3/uL (ref 1.7–7.7)
Neutrophils Relative %: 70 %
Platelets: 359 10*3/uL (ref 150–400)
RBC: 3.86 MIL/uL — ABNORMAL LOW (ref 4.22–5.81)
RDW: 14.9 % (ref 11.5–15.5)
WBC: 7.3 10*3/uL (ref 4.0–10.5)
nRBC: 0 % (ref 0.0–0.2)

## 2022-11-29 LAB — PROTIME-INR
INR: 2.8 — ABNORMAL HIGH (ref 0.8–1.2)
INR: 3 — ABNORMAL HIGH (ref 0.8–1.2)
Prothrombin Time: 29.4 seconds — ABNORMAL HIGH (ref 11.4–15.2)
Prothrombin Time: 31.1 seconds — ABNORMAL HIGH (ref 11.4–15.2)

## 2022-11-29 LAB — TROPONIN I (HIGH SENSITIVITY)
Troponin I (High Sensitivity): 7 ng/L (ref <18)
Troponin I (High Sensitivity): 8 ng/L (ref <18)

## 2022-11-29 LAB — BRAIN NATRIURETIC PEPTIDE: B Natriuretic Peptide: 160.1 pg/mL — ABNORMAL HIGH (ref 0.0–100.0)

## 2022-11-29 MED ORDER — SODIUM CHLORIDE 0.9% FLUSH
3.0000 mL | Freq: Two times a day (BID) | INTRAVENOUS | Status: DC
  Administered 2022-11-29 – 2022-12-03 (×8): 3 mL via INTRAVENOUS

## 2022-11-29 MED ORDER — ACETAMINOPHEN 325 MG PO TABS: 650.0000 mg | ORAL_TABLET | Freq: Four times a day (QID) | ORAL | Status: DC | PRN

## 2022-11-29 MED ORDER — ORAL CARE MOUTH RINSE: 15.0000 mL | OROMUCOSAL | Status: DC | PRN

## 2022-11-29 MED ORDER — SODIUM CHLORIDE 0.9 % IV SOLN: 250.0000 mL | INTRAVENOUS | Status: DC | PRN

## 2022-11-29 MED ORDER — PHYTONADIONE 1 MG/0.5 ML ORAL SOLUTION
0.5000 mg | Freq: Once | ORAL | Status: AC
  Administered 2022-11-29: 0.5 mg via ORAL
  Filled 2022-11-29: qty 0.5

## 2022-11-29 MED ORDER — ONDANSETRON HCL 4 MG/2ML IJ SOLN: 4.0000 mg | Freq: Four times a day (QID) | INTRAMUSCULAR | Status: DC | PRN

## 2022-11-29 MED ORDER — METHOCARBAMOL 500 MG PO TABS: 500.0000 mg | ORAL_TABLET | Freq: Four times a day (QID) | ORAL | Status: DC | PRN

## 2022-11-29 MED ORDER — ALBUMIN HUMAN 25 % IV SOLN: 12.5000 g | Freq: Once | INTRAVENOUS | Status: DC

## 2022-11-29 MED ORDER — SODIUM CHLORIDE 0.9% FLUSH: 3.0000 mL | INTRAVENOUS | Status: DC | PRN

## 2022-11-29 MED ORDER — ACETAMINOPHEN 650 MG RE SUPP: 650.0000 mg | Freq: Four times a day (QID) | RECTAL | Status: DC | PRN

## 2022-11-29 MED ORDER — FUROSEMIDE 20 MG PO TABS
20.0000 mg | ORAL_TABLET | Freq: Every day | ORAL | Status: DC
  Administered 2022-11-30: 20 mg via ORAL
  Filled 2022-11-29: qty 1

## 2022-11-29 MED ORDER — ALLOPURINOL 300 MG PO TABS
300.0000 mg | ORAL_TABLET | Freq: Every day | ORAL | Status: DC
  Administered 2022-11-30 – 2022-12-01 (×2): 300 mg via ORAL
  Filled 2022-11-29 (×4): qty 1

## 2022-11-29 MED ORDER — CARVEDILOL 3.125 MG PO TABS
3.1250 mg | ORAL_TABLET | Freq: Two times a day (BID) | ORAL | Status: DC
  Administered 2022-11-29 – 2022-12-03 (×7): 3.125 mg via ORAL
  Filled 2022-11-29 (×8): qty 1

## 2022-11-29 MED ORDER — HYDROCODONE-ACETAMINOPHEN 5-325 MG PO TABS
1.0000 | ORAL_TABLET | ORAL | Status: DC | PRN
  Administered 2022-11-30: 2 via ORAL
  Administered 2022-12-03: 1 via ORAL
  Filled 2022-11-29 (×2): qty 2
  Filled 2022-11-29: qty 1

## 2022-11-29 MED ORDER — ONDANSETRON HCL 4 MG PO TABS: 4.0000 mg | ORAL_TABLET | Freq: Four times a day (QID) | ORAL | Status: DC | PRN

## 2022-11-29 NOTE — Progress Notes (Signed)
CROSS COVER NOTE  NAME: [name] MRN: [id number] DOB : 04/15/1941   Cross coverage  Patient requesting CODE STATUS change.  I was able to successfully communicate with the patient, [name], over the phone.    The patient is currently alert and oriented x 4.  All questions regarding his current care plan and CODE STATUS were answered.  In case of an emergency, if the patient has no pulse and is not breathing on his own, he would like to be a DO NOT RESUSCITATE (DNR) /DO NOT INTUBATE (DNI).  CODE STATUS changed to DNR.  Bedside RN has been notified.  Raenette Rover, DNP, [place]

## 2022-11-29 NOTE — ED Triage Notes (Signed)
BIB EMS for CHF exaceration. Repiorts fluid build up all over. Reports suppose to have a procedure toput a drain to manage fluid. Patient c/o of sob with exertion.

## 2022-11-29 NOTE — ED Notes (Signed)
ED TO INPATIENT HANDOFF REPORT  ED Nurse Name and Phone #: Tori [phone]  S Name/Age/Gender [name] 82 y.o. male Room/Bed: TRACC/TRACC  Code Status   Code Status: Full Code  Home/SNF/Other Home Patient oriented to: self, place, time, and situation Is this baseline? Yes   Triage Complete: Triage complete  Chief Complaint Malignant pleural effusion [J91.0]  Triage Note BIB EMS for CHF exaceration. Repiorts fluid build up all over. Reports suppose to have a procedure toput a drain to manage fluid. Patient c/o of sob with exertion.    Allergies Allergies  Allergen Reactions   Codeine     "Crazy reaction" per Dr. Jenny Reichmann Russo's history 08/06/21   Oxycodone Other (See Comments)    Makes feel funny   Sertraline Hcl Other (See Comments)    Makes feel funny   Sitagliptin Rash    Level of Care/Admitting Diagnosis ED Disposition     ED Disposition  Admit   Condition  --   [facility]: [place] [[id number]]  Level of Care: Med-Surg [16]  May admit patient to [facility] or [facility] if equivalent level of care is available:: No  Covid Evaluation: Asymptomatic - no recent exposure (last 10 days) testing not required  Diagnosis: Malignant pleural effusion [511.81.ICD-9-CM]  Admitting Physician: Charlynne Cousins [[id number]]  Attending Physician: Charlynne Cousins [[id number]]  Certification:: I certify this patient will need inpatient services for at least 2 midnights          B Medical/Surgery History Past Medical History:  Diagnosis Date   Arthritis    CHB (complete heart block) ([place])    patient has permanent pacemaker   Chronic anticoagulation    CKD (chronic kidney disease), stage III ([place])    per hx in OV note from Dr. Shon Baton, 08/06/21   Depression    Diabetes mellitus without complication ([place])    Dyspnea    Gout    History of echocardiogram    Echo 9/16:  EF 55-60%, no RWMA, mild MR, mild LAE, trivial PI   History of kidney  stones    HOH (hard of hearing)    wears hearing aids   HTN (hypertension)    LV dysfunction    EF down to 30% per echo Feb 2013 -  started on ARB and low dose beta blocker   Morbid obesity ([place])    Nephrolithiasis    Occasional tremors 10/08/2021   Pt states that he has tremors in his hands, and that his doctor told him it was from old age.   Osteoarthritis    Pacemaker 11/17/2008   Permanent atrial fibrillation ([place])    failed cardioversion in April of 2010   Prostate CA [facility])    Thrombophilia [facility])    Urosepsis    Wears glasses    reading only   Wears partial dentures    Past Surgical History:  Procedure Laterality Date   CARDIOVERSION  02/15/2009   failed   CYSTOSCOPY W/ URETERAL STENT PLACEMENT Left 12/25/2021   Procedure: CYSTOSCOPY WITH RETROGRADE PYELOGRAM/URETERAL STENT PLACEMENT;  Surgeon: Alexis Frock, MD;  [facility];  Service: Urology;  Laterality: Left;   CYSTOSCOPY WITH RETROGRADE PYELOGRAM, URETEROSCOPY AND STENT PLACEMENT Left 10/14/2021   Procedure: CYSTOSCOPY WITH RETROGRADE PYELOGRAM, URETEROSCOPY WITH  BIOPSY  AND STENT PLACEMENT;  Surgeon: Franchot Gallo, MD;  Location: [facility];  Service: Urology;  Laterality: Left;   EP IMPLANTABLE DEVICE N/A 08/17/2015   Procedure:  PPM Generator Changeout;  Surgeon: Thompson Grayer, MD;  Location: [place] [facility];  Service: Cardiovascular;  Laterality: N/A;   PACEMAKER PLACEMENT  11/17/2008   Medtronic   RADIOACTIVE SEED IMPLANT     1990's ?   ROBOT ASSITED LAPAROSCOPIC NEPHROURETERECTOMY Left 12/25/2021   Procedure: XI ROBOT ASSITED LAPAROSCOPIC NEPHROURETERECTOMY, umblical hernia repair;  Surgeon: Alexis Frock, MD;  [facility];  Service: Urology;  Laterality: Left;   SHOULDER SURGERY Left    pt unsure of date   TOTAL KNEE ARTHROPLASTY Left    many years ago per pat   TOTAL KNEE ARTHROPLASTY Right 05/14/2021   Procedure: TOTAL KNEE ARTHROPLASTY;  Surgeon: Paralee Cancel, MD;   [facility];  Service: Orthopedics;  Laterality: Right;   WISDOM TOOTH EXTRACTION       A IV Location/Drains/Wounds Patient Lines/Drains/Airways Status     Active Line/Drains/Airways     Name Placement date Placement time Site Days   Peripheral IV 11/29/22 20 G Right Antecubital 11/29/22  1626  Antecubital  less than 1   Ureteral Drain/Stent Left ureter 6 Fr. 10/14/21  1006  Left ureter  411   Ureteral Drain/Stent Left ureter 6 Fr. 12/25/21  1255  Left ureter  339   External Urinary Catheter 11/14/22  --  --  15   Incision - 5 Ports Abdomen 1: Left;Lateral;Lower 2: Posterior;Umbilicus 3: Superior;Umbilicus 4: Right;Superior;Umbilicus 03/70/48  8891  -- 339            Intake/Output Last 24 hours No intake or output data in the 24 hours ending 11/29/22 1817  Labs/Imaging Results for orders placed or performed during the hospital encounter of 11/29/22 (from the past 48 hour(s))  Troponin I (High Sensitivity)     Status: None   Collection Time: 11/29/22  1:20 PM  Result Value Ref Range   Troponin I (High Sensitivity) 7 <18 ng/L    Comment: (NOTE) Elevated high sensitivity troponin I (hsTnI) values and significant  changes across serial measurements may suggest ACS but many other  chronic and acute conditions are known to elevate hsTnI results.  Refer to the Links section for chest pain algorithms and additional  guidance. Performed at [facility], [place] [address], [place],  [address]   CBC with Differential     Status: Abnormal   Collection Time: 11/29/22  1:20 PM  Result Value Ref Range   WBC 7.3 4.0 - 10.5 K/uL   RBC 3.86 (L) 4.22 - 5.81 MIL/uL   Hemoglobin 9.4 (L) 13.0 - 17.0 g/dL   HCT 30.3 (L) 39.0 - 52.0 %   MCV 78.5 (L) 80.0 - 100.0 fL   MCH 24.4 (L) 26.0 - 34.0 pg   MCHC 31.0 30.0 - 36.0 g/dL   RDW 14.9 11.5 - 15.5 %   Platelets 359 150 - 400 K/uL   nRBC 0.0 0.0 - 0.2 %   Neutrophils Relative % 70 %   Neutro Abs 5.1 1.7 - 7.7 K/uL    Lymphocytes Relative 15 %   Lymphs Abs 1.1 0.7 - 4.0 K/uL   Monocytes Relative 10 %   Monocytes Absolute 0.7 0.1 - 1.0 K/uL   Eosinophils Relative 3 %   Eosinophils Absolute 0.2 0.0 - 0.5 K/uL   Basophils Relative 1 %   Basophils Absolute 0.1 0.0 - 0.1 K/uL   Immature Granulocytes 1 %   Abs Immature Granulocytes 0.04 0.00 - 0.07 K/uL    Comment: Performed at [facility],  [address], [place], [place] [address]  Comprehensive metabolic panel     Status: Abnormal   Collection Time: 11/29/22  1:20 PM  Result Value Ref Range   Sodium 136 135 - 145 mmol/L   Potassium 4.3 3.5 - 5.1 mmol/L   Chloride 104 98 - 111 mmol/L   CO2 23 22 - 32 mmol/L   Glucose, Bld 126 (H) 70 - 99 mg/dL    Comment: Glucose reference range applies only to samples taken after fasting for at least 8 hours.   BUN 31 (H) 8 - 23 mg/dL   Creatinine, Ser 2.06 (H) 0.61 - 1.24 mg/dL   Calcium 9.2 8.9 - 10.3 mg/dL   Total Protein 6.8 6.5 - 8.1 g/dL   Albumin 2.9 (L) 3.5 - 5.0 g/dL   AST 15 15 - 41 U/L   ALT 10 0 - 44 U/L   Alkaline Phosphatase 90 38 - 126 U/L   Total Bilirubin 0.4 0.3 - 1.2 mg/dL   GFR, Estimated 32 (L) >60 mL/min    Comment: (NOTE) Calculated using the CKD-EPI Creatinine Equation (2021)    Anion gap 9 5 - 15    Comment: Performed at [facility] [address] [place], [place] [address]  Brain natriuretic peptide     Status: Abnormal   Collection Time: 11/29/22  1:20 PM  Result Value Ref Range   B Natriuretic Peptide 160.1 (H) 0.0 - 100.0 pg/mL    Comment: Performed at [facility] [address], [place], [place] [address]  Protime-INR     Status: Abnormal   Collection Time: 11/29/22  1:20 PM  Result Value Ref Range   Prothrombin Time 29.4 (H) 11.4 - 15.2 seconds   INR 2.8 (H) 0.8 - 1.2    Comment: (NOTE) INR goal varies based on device and disease states. Performed at [facility], [place] [address], [place], [place] [address]    DG Chest 2 View  Result  Date: 11/29/2022 CLINICAL DATA:  Chest pain and shortness of breath. EXAM: CHEST - 2 VIEW COMPARISON:  Chest x-ray dated 11/15/2022 FINDINGS: Heart size and mediastinal contours are stable. RIGHT chest wall pacemaker/ICD apparatus appears stable in position. Increased opacity at the RIGHT lung base. LEFT lung remains clear. No pneumothorax is seen. No acute-appearing osseous abnormality. IMPRESSION: Increased opacity at the RIGHT lung base, likely moderate-sized pleural effusion and associated compressive atelectasis. Electronically Signed   By: Franki Cabot M.D.   On: 11/29/2022 14:19    Pending Labs [employer] (From admission, onward)     Start     Ordered   Signed and Held  CBC  Tomorrow morning,   R        Signed and Held   Signed and Occupational hygienist morning,   R        Signed and Held   Signed and Held  Protime-INR  Once,   R        Signed and Held            Vitals/Pain Today's Vitals   11/29/22 1445 11/29/22 1530 11/29/22 1625 11/29/22 1730  BP:  99/64 112/67   Pulse:  75 87   Resp:  16 19   Temp: 98.4 F (36.9 C)   98.4 F (36.9 C)  TempSrc: Oral   Oral  SpO2:  99% 100%   Weight:      Height:  PainSc:        Isolation Precautions No active isolations  Medications Medications - No data to display  Mobility walks with person assist High fall risk   Focused Assessments Cardiac Assessment Handoff:    Lab Results  Component Value Date   CKTOTAL 497 (H) 09/15/2008   CKMB 2.2 09/15/2008   TROPONINI 0.02        NO INDICATION OF MYOCARDIAL INJURY. 09/15/2008   Lab Results  Component Value Date   DDIMER (H) 09/15/2008    5.20        AT THE INHOUSE ESTABLISHED CUTOFF VALUE OF 0.48 ug/mL FEU, THIS ASSAY HAS BEEN DOCUMENTED IN THE LITERATURE TO HAVE   Does the Patient currently have chest pain? No    R Recommendations: See Admitting Provider Note  Report given to:   Additional Notes: Pt is axox4,

## 2022-11-29 NOTE — H&P (Signed)
History and Physical  [name] ENI:[id number] DOB: [birth year] DOA: 11/29/2022  PCP: Shon Baton, MD Patient coming from: home  I have personally briefly reviewed patient's old medical records in [place]   Chief Complaint: Shortness of breath  HPI: [name] is a 82 y.o. 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 Collene Mares in February 2023, status post pacemaker, obesity, diabetes mellitus with a last A1c of 6.1, recently discharged from the hospital for recurrent pleural effusion which is probably malignant they drained about 1.3 L and he relates his breathing improved.  He denies productive cough fever or sick contacts.  He relates to significant decreased oral intake antibiotic 10 pound weight gain in the last 2 weeks.  In the ED: He remains with a borderline blood pressure which she relates is normal for him satting 95% on room air with a creatinine at baseline, BMP in 160 hemoglobin stable no leukocytosis afebrile, INR 2.8, chest x-ray was done that showed increased opacity of the right lung base with a recurrent right pleural effusion with compressive atelectasis   Review of Systems: All systems reviewed and apart from history of presenting illness, are negative.  Past Medical History:  Diagnosis Date   Arthritis    CHB (complete heart block) ([place])    patient has permanent pacemaker   Chronic anticoagulation    CKD (chronic kidney disease), stage III ([place])    per hx in OV note from Dr. Shon Baton, 08/06/21   Depression    Diabetes mellitus without complication ([place])    Dyspnea    Gout    History of echocardiogram    Echo 9/16:  EF 55-60%, no RWMA, mild MR, mild LAE, trivial PI   History of kidney stones    HOH (hard of hearing)    wears hearing aids   HTN  (hypertension)    LV dysfunction    EF down to 30% per echo Feb 2013 -  started on ARB and low dose beta blocker   Morbid obesity ([place])    Nephrolithiasis    Occasional tremors 10/08/2021   Pt states that he has tremors in his hands, and that his doctor told him it was from old age.   Osteoarthritis    Pacemaker 11/17/2008   Permanent atrial fibrillation ([place])    failed cardioversion in April of 2010   Prostate CA [facility])    Thrombophilia [facility])    Urosepsis    Wears glasses    reading only   Wears partial dentures    Past Surgical History:  Procedure Laterality Date   CARDIOVERSION  02/15/2009   failed   CYSTOSCOPY W/ URETERAL STENT PLACEMENT Left 12/25/2021   Procedure: CYSTOSCOPY WITH RETROGRADE PYELOGRAM/URETERAL STENT PLACEMENT;  Surgeon: Alexis Frock, MD;  [facility];  Service: Urology;  Laterality: Left;   CYSTOSCOPY WITH RETROGRADE PYELOGRAM, URETEROSCOPY AND STENT PLACEMENT Left 10/14/2021   Procedure: CYSTOSCOPY WITH RETROGRADE PYELOGRAM, URETEROSCOPY WITH  BIOPSY  AND STENT PLACEMENT;  Surgeon: Franchot Gallo, MD;  Location: [facility];  Service: Urology;  Laterality: Left;   EP IMPLANTABLE DEVICE N/A 08/17/2015   Procedure: PPM Generator Changeout;  Surgeon: Thompson Grayer, MD;  Location: [place] [facility];  Service: Cardiovascular;  Laterality: N/A;   PACEMAKER PLACEMENT  11/17/2008   Medtronic  RADIOACTIVE SEED IMPLANT     1990's ?   ROBOT ASSITED LAPAROSCOPIC NEPHROURETERECTOMY Left 12/25/2021   Procedure: XI ROBOT ASSITED LAPAROSCOPIC NEPHROURETERECTOMY, umblical hernia repair;  Surgeon: Alexis Frock, MD;  [facility];  Service: Urology;  Laterality: Left;   SHOULDER SURGERY Left    pt unsure of date   TOTAL KNEE ARTHROPLASTY Left    many years ago per pat   TOTAL KNEE ARTHROPLASTY Right 05/14/2021   Procedure: TOTAL KNEE ARTHROPLASTY;  Surgeon: Paralee Cancel, MD;  [facility];  Service: Orthopedics;  Laterality: Right;    WISDOM TOOTH EXTRACTION     Social History:  reports that he has never smoked. He has never used smokeless tobacco. He reports that he does not drink alcohol and does not use drugs.   Allergies  Allergen Reactions   Codeine     "Crazy reaction" per Dr. Jenny Reichmann Russo's history 08/06/21   Oxycodone Other (See Comments)    Makes feel funny   Sertraline Hcl Other (See Comments)    Makes feel funny   Sitagliptin Rash    Family History  Problem Relation Age of Onset   Heart attack Father 87   Healthy Mother    Healthy Brother     Prior to Admission medications   Medication Sig Start Date End Date Taking? Authorizing Provider  allopurinol (ZYLOPRIM) 300 MG tablet TAKE ONE TABLET BY MOUTH ONCE DAILY Patient not taking: Reported on 11/14/2022 07/02/15   Nahser, Wonda Cheng, MD  carvedilol (COREG) 3.125 MG tablet Take 1 tablet (3.125 mg total) by mouth 2 (two) times daily. Please schedule overdue appt for future refills. 2nd attempt. Patient taking differently: Take 3.125 mg by mouth 2 (two) times daily. 12/22/18   Allred, Jeneen Rinks, MD  dupilumab (DUPIXENT) 200 MG/1.14ML prefilled syringe Inject 200 mg into the skin every 14 (fourteen) days.    [provider]  furosemide (LASIX) 20 MG tablet Take 20 mg by mouth daily as needed for edema.    [provider]  losartan (COZAAR) 50 MG tablet TAKE 1 TABLET BY MOUTH ONCE DAILY.  PLEASE MAKE APPT WITH DR. ALLRED BEFORE ANYMORE REFILLS. Patient taking differently: Take 50 mg by mouth daily. 10/01/20   Allred, Jeneen Rinks, MD  metFORMIN (GLUCOPHAGE) 500 MG tablet Take 500 mg by mouth at bedtime. 04/20/15   [provider]  methocarbamol (ROBAXIN) 500 MG tablet Take 1 tablet (500 mg total) by mouth every 6 (six) hours as needed for muscle spasms. 05/15/21   Irving Copas, PA-C  triamcinolone cream (KENALOG) 0.1 % Apply 1 application topically as needed (rash). 09/07/17   [provider]  warfarin (COUMADIN) 5 MG tablet Take 1.5  tablets (7.5 mg total) by mouth daily. Take 7.5 mg by mouth daily Patient taking differently: Take 7.5 mg by mouth daily. 12/30/21   Bishop Limbo, MD   Physical Exam: Vitals:   11/29/22 1445 11/29/22 1445 11/29/22 1530 11/29/22 1625  BP: (!) 82/55  99/64 112/67  Pulse: 71  75 87  Resp: '16  16 19  '$ Temp:  98.4 F (36.9 C)    TempSrc:  Oral    SpO2: 97%  99% 100%  Weight:      Height:        General exam: Moderately built and nourished patient. Head, eyes and ENT: Nontraumatic and normocephalic.  Neck: Supple. No JVD, carotid bruit or thyromegaly. Lymphatics: No lymphadenopathy. Respiratory system: Good air movement with decreased lung sounds on the right Cardiovascular system: S1  and S2 heard, RRR. No JVD. Gastrointestinal system: Abdomen is nondistended, soft and nontender. Normal bowel sounds heard.  Central nervous system: Alert and oriented. No focal neurological deficits. Extremities: Symmetric 5 x 5 power. Peripheral pulses symmetrically felt.  Skin: No rashes or acute findings. Musculoskeletal system: Negative exam. Psychiatry: Pleasant and cooperative.   Labs on Admission:  Basic Metabolic Panel: Recent Labs  Lab 11/26/22 1432 11/29/22 1320  NA 140 136  K 4.4 4.3  CL 106 104  CO2 25 23  GLUCOSE 121* 126*  BUN 45* 31*  CREATININE 2.44* 2.06*  CALCIUM 9.2 9.2   Liver Function Tests: Recent Labs  Lab 11/26/22 1432 11/29/22 1320  AST 14* 15  ALT 11 10  ALKPHOS 89 90  BILITOT 0.4 0.4  PROT 7.2 6.8  ALBUMIN 3.2* 2.9*   No results for input(s): "LIPASE", "AMYLASE" in the last 168 hours. No results for input(s): "AMMONIA" in the last 168 hours. CBC: Recent Labs  Lab 11/26/22 1432 11/29/22 1320  WBC 7.1 7.3  NEUTROABS 5.3 5.1  HGB 9.2* 9.4*  HCT 30.3* 30.3*  MCV 79.3* 78.5*  PLT 342 359   Cardiac Enzymes: No results for input(s): "CKTOTAL", "CKMB", "CKMBINDEX", "TROPONINI" in the last 168 hours.  BNP (last 3 results) No results for  input(s): "PROBNP" in the last 8760 hours. CBG: No results for input(s): "GLUCAP" in the last 168 hours.  Radiological Exams on Admission: DG Chest 2 View  Result Date: 11/29/2022 CLINICAL DATA:  Chest pain and shortness of breath. EXAM: CHEST - 2 VIEW COMPARISON:  Chest x-ray dated 11/15/2022 FINDINGS: Heart size and mediastinal contours are stable. RIGHT chest wall pacemaker/ICD apparatus appears stable in position. Increased opacity at the RIGHT lung base. LEFT lung remains clear. No pneumothorax is seen. No acute-appearing osseous abnormality. IMPRESSION: Increased opacity at the RIGHT lung base, likely moderate-sized pleural effusion and associated compressive atelectasis. Electronically Signed   By: Franki Cabot M.D.   On: 11/29/2022 14:19    EKG: Independently reviewed.  Twelve-lead EKG showed rhythm about 70 bpm with paced ventricular rhythm  Assessment/Plan Recurrent malignant pleural effusion/pleuritic chest pain The ED had spoken to interventional radiology who is willing to do a thoracocentesis once his INR is improved.  Will hold his Coumadin dose. Repeat INR daily for possible thoracocentesis ultrasound-guided by IR on 12/01/2022. He is remained afebrile with no cough no leukocytosis is very unlikely there is infectious etiology. His pain is pleuritic with make ACS unlikely there is likely due to his current pleural effusion.  Permanent atrial fibrillation/  Complete heart block (HCC)/  Pacemaker-Medtronic: Rate control, hold Coumadin check INR daily. Will give him 0.5 vitamin K as INR is 2.8, interventional radiology will do procedure once INR is improved.  CKD (chronic kidney disease), stage IV (HCC) Creatinine seems to be at baseline. Continue diuretic therapy.  Infiltrative high-grade papillary urothelial carcinoma of the renal pelvis: Status post resection, he does not want any chemotherapy due to his life expectancy that was explained to him. Will consult palliative  care to discuss end-of-life.    DVT Prophylaxis: Therapeutic INR Code Status: Full code Family Communication: Grandson Disposition Plan: Inpatient     It is my clinical opinion that admission to INPATIENT is reasonable and necessary in this 82 y.o. male past medical history of infiltrative high-grade papillary urothelial carcinoma who comes in with recurrent malignant pleural effusion causing shortness of breath with an INR of 2.8 holding Coumadin, interventional radiology to perform thoracocentesis on 1/15 or  12/02/2022  Given the aforementioned, the predictability of an adverse outcome is felt to be significant. I expect that the patient will require at least 2 midnights in the hospital to treat this condition.  Charlynne Cousins MD [facility]   11/29/2022, 5:49 PM

## 2022-11-29 NOTE — ED Provider Triage Note (Signed)
Emergency Medicine Provider Triage Evaluation Note  [name] , a 82 y.o. male  was evaluated in triage.  Pt complains of lower extremity edema and SOB for the past few days. He has not been taking his lasix for over a week. Reports mainly SOB but does have some chest pain.  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  Review of Systems  Positive:  Negative:   Physical Exam  BP 100/65 (BP Location: Left Arm)   Pulse 73   Temp 98.6 F (37 C)   Resp 16   Ht 6' (1.829 m)   Wt 111.6 kg   SpO2 95%   BMI 33.36 kg/m  Gen:   Awake, no distress   Resp:  Normal effort  MSK:   Moves extremities without difficulty  Other:  He has some mild swelling noted to the bilateral lower extremities.  He does have more swelling to the left leg with some erythema but he thinks this is chronic for him.  Lungs sounds are diminished.  Medical Decision Making  Medically screening exam initiated at 1:10 PM.  Appropriate orders placed.  [name] was informed that the remainder of the evaluation will be completed by another provider, this initial triage assessment does not replace that evaluation, and the importance of remaining in the ED until their evaluation is complete.  Will need to be roomed now.  Charge aware.  Labs ordered.  Patient with my attending to this chart.  Will defer on any CT imaging at this time and check of the patient's INR is therapeutic for any potential blood clots.   Sherrell Puller, PA-C 11/29/22 1344

## 2022-11-29 NOTE — ED Notes (Signed)
Pt refuses to sit in bed at this time

## 2022-11-29 NOTE — ED Provider Notes (Signed)
[facility] Provider Note   CSN: [id number] Arrival date & time: 11/29/22  1244     History  Chief Complaint  Patient presents with   Leg Swelling   Shortness of Breath    [name] is a 82 y.o. male.  With PMH A-fib on warfarin, CKD, urothelial cancer status post left nephrectomy, DM, HTN, recurrent malignant pleural effusion presenting with shortness of breath.  Patient had 1.3 L fluid drained on last admission 11/15/2022.  He is here mainly with complaints of dyspnea on exertion saying he is easily winded with walking across the room.  He said when he had the fluid drained from his lung he was easily walking without shortness of breath and felt much better.  He is mainly complaining of some pain on the right lung with inspiration and breathing.  He has had no productive cough, no fevers, no congestion, no rhinorrhea.  He has had decreased p.o. intake eating and drinking about once a day but notes he has had 10 pound weight gain over the past 2 weeks.  He feels like it may be related to fluid.  His last dose of warfarin was last night around 10 PM.  He is supposed to follow-up with cardiothoracic surgery in the outpatient setting for Pleurx catheter placement for his symptoms.   Shortness of Breath      Home Medications Prior to Admission medications   Medication Sig Start Date End Date Taking? Authorizing Provider  carvedilol (COREG) 3.125 MG tablet Take 1 tablet (3.125 mg total) by mouth 2 (two) times daily. Please schedule overdue appt for future refills. 2nd attempt. Patient taking differently: Take 3.125 mg by mouth 2 (two) times daily. 12/22/18  Yes Allred, Jeneen Rinks, MD  furosemide (LASIX) 20 MG tablet Take 20 mg by mouth daily as needed for edema.   Yes [provider]  losartan (COZAAR) 50 MG tablet TAKE 1 TABLET BY MOUTH ONCE DAILY.  PLEASE MAKE APPT WITH DR. ALLRED BEFORE ANYMORE REFILLS. Patient taking differently: Take 50 mg  by mouth daily. 10/01/20  Yes Allred, Jeneen Rinks, MD  metFORMIN (GLUCOPHAGE) 500 MG tablet Take 500 mg by mouth at bedtime. 04/20/15  Yes [provider]  warfarin (COUMADIN) 5 MG tablet Take 1.5 tablets (7.5 mg total) by mouth daily. Take 7.5 mg by mouth daily Patient taking differently: Take 7.5 mg by mouth daily. 12/30/21  Yes Abu-Salha, Inda Castle, MD  allopurinol (ZYLOPRIM) 300 MG tablet TAKE ONE TABLET BY MOUTH ONCE DAILY Patient not taking: Reported on 11/14/2022 07/02/15   Nahser, Wonda Cheng, MD  methocarbamol (ROBAXIN) 500 MG tablet Take 1 tablet (500 mg total) by mouth every 6 (six) hours as needed for muscle spasms. Patient not taking: Reported on 11/29/2022 05/15/21   Irving Copas, PA-C      Allergies    Codeine, Oxycodone, Sertraline hcl, and Sitagliptin    Review of Systems   Review of Systems  Respiratory:  Positive for shortness of breath.     Physical Exam Updated Vital Signs BP (!) 126/105 (BP Location: Left Arm)   Pulse 82   Temp 98.5 F (36.9 C)   Resp 20   Ht 6' (1.829 m)   Wt 111.6 kg   SpO2 98%   BMI 33.36 kg/m  Physical Exam Constitutional: Alert and oriented.  Chronically ill-appearing but no acute distress Eyes: Conjunctivae are normal. ENT      Head: Normocephalic and atraumatic.      Nose: No  congestion.      Mouth/Throat: Mucous membranes are moist.      Neck: No stridor. Cardiovascular: S1, S2, regular rate, warm and dry  respiratory: Speaking in almost full sentences intermittently broken.  Decreased breath sounds of the right lung, clear breath sounds on the left.  No retractions, no nasal flaring Gastrointestinal: Soft and nondistended Musculoskeletal: Normal range of motion in all extremities. Trace pitting edema equal of the lower extremities Neurologic: Normal speech and language. No gross focal neurologic deficits are appreciated. Skin: Skin is warm, dry and intact. No rash noted. Psychiatric: Mood and affect are normal. Speech and  behavior are normal.  ED Results / Procedures / Treatments   Labs (all labs ordered are listed, but only abnormal results are displayed) Labs Reviewed  CBC WITH DIFFERENTIAL/PLATELET - Abnormal; Notable for the following components:      Result Value   RBC 3.86 (*)    Hemoglobin 9.4 (*)    HCT 30.3 (*)    MCV 78.5 (*)    MCH 24.4 (*)    All other components within normal limits  COMPREHENSIVE METABOLIC PANEL - Abnormal; Notable for the following components:   Glucose, Bld 126 (*)    BUN 31 (*)    Creatinine, Ser 2.06 (*)    Albumin 2.9 (*)    GFR, Estimated 32 (*)    All other components within normal limits  BRAIN NATRIURETIC PEPTIDE - Abnormal; Notable for the following components:   B Natriuretic Peptide 160.1 (*)    All other components within normal limits  PROTIME-INR - Abnormal; Notable for the following components:   Prothrombin Time 29.4 (*)    INR 2.8 (*)    All other components within normal limits  PROTIME-INR - Abnormal; Notable for the following components:   Prothrombin Time 31.1 (*)    INR 3.0 (*)    All other components within normal limits  BASIC METABOLIC PANEL  CBC  TROPONIN I (HIGH SENSITIVITY)  TROPONIN I (HIGH SENSITIVITY)    EKG None  Radiology DG Chest 2 View  Result Date: 11/29/2022 CLINICAL DATA:  Chest pain and shortness of breath. EXAM: CHEST - 2 VIEW COMPARISON:  Chest x-ray dated 11/15/2022 FINDINGS: Heart size and mediastinal contours are stable. RIGHT chest wall pacemaker/ICD apparatus appears stable in position. Increased opacity at the RIGHT lung base. LEFT lung remains clear. No pneumothorax is seen. No acute-appearing osseous abnormality. IMPRESSION: Increased opacity at the RIGHT lung base, likely moderate-sized pleural effusion and associated compressive atelectasis. Electronically Signed   By: Franki Cabot M.D.   On: 11/29/2022 14:19    Procedures Procedures  Remain on constant cardiac monitoring normal sinus rhythm normal  rates.  Medications Ordered in ED Medications  allopurinol (ZYLOPRIM) tablet 300 mg (has no administration in time range)  carvedilol (COREG) tablet 3.125 mg (3.125 mg Oral Given 11/29/22 2230)  methocarbamol (ROBAXIN) tablet 500 mg (has no administration in time range)  sodium chloride flush (NS) 0.9 % injection 3 mL (3 mLs Intravenous Given 11/29/22 2230)  sodium chloride flush (NS) 0.9 % injection 3 mL (has no administration in time range)  0.9 %  sodium chloride infusion (has no administration in time range)  acetaminophen (TYLENOL) tablet 650 mg (has no administration in time range)    Or  acetaminophen (TYLENOL) suppository 650 mg (has no administration in time range)  HYDROcodone-acetaminophen (NORCO/VICODIN) 5-325 MG per tablet 1-2 tablet (has no administration in time range)  ondansetron (ZOFRAN) tablet 4 mg (has no  administration in time range)    Or  ondansetron (ZOFRAN) injection 4 mg (has no administration in time range)  furosemide (LASIX) tablet 20 mg (has no administration in time range)  Oral care mouth rinse (has no administration in time range)  phytonadione (VITAMIN K) oral solution 1 mg/0.5 mL (0.5 mg Oral Given 11/29/22 2230)    ED Course/ Medical Decision Making/ A&P Clinical Course as of 11/29/22 2354  Sat Nov 29, 2022  1557 Spoke with on-call cardiothoracic surgeon Dr. Lavonna Monarch who says he does not do Pleurx catheters and would not do it with INR 2.8 at this time.  Will try reaching out to pulmonology. [VB]  [id number] Spoke with pulmonology on-call [place] who said there is nobody on from their team this weekend who had placed Pleurx catheters.  Recommends reaching out to IR. [VB]  1641 Spoke with IR on-call Daryll Brod, they could place patient's Pleurx catheter inpatient either Monday or Tuesday but would have to have patient hold his Coumadin.  Patient is open to waiting for Pleurx catheter placement as opposed to recurrent thoracentesis.  Will reach out to hospitalist  for admission. [VB]  1731 Discussed with Dr. Olevia Bowens who will evaluate patient for admission for observation and IR placement of Pleurx catheter. [VB]    Clinical Course User Index [VB] Elgie Congo, MD   {                            Medical Decision Making JOVIAN LEMBCKE is a 82 y.o. male.  With PMH A-fib on warfarin, CKD, urothelial cancer status post left nephrectomy, DM, HTN, recurrent malignant pleural effusion presenting with shortness of breath.  Patient had 1.3 L fluid drained on last admission 11/15/2022.   Patient had chest x-ray performed here which I personally reviewed showing moderate-sized pleural effusion and associated compressive atelectasis.  Based on his previous thoracentesis likely recurrent malignant pleural effusion.  I doubt associated superimposed bacterial pneumonia as patient is afebrile with normal white blood cell count 7.3 and without any infectious complaints no fevers no productive cough no URI symptoms.  His EKG was reviewed by me with no acute ischemic changes his high sensitive troponin was 7, doubt ACS, heart score 4.  Pain is likely secondary to known recurrent pleural effusion.  His creatinine 2.06 at baseline.  BNP slightly elevated 160 although not significantly fluid overloaded on exam no significant pitting edema and softer blood pressures at baseline holding off from IV diuresis at this time.  Spoke with IR on-call Daryll Brod, they could place patient's Pleurx catheter inpatient either Monday or Tuesday but would have to have patient hold his Coumadin.  Patient is open to waiting for Pleurx catheter placement as opposed to recurrent thoracentesis.  Will reach out to hospitalist for admission. [VB] Discussed with Dr. Olevia Bowens who will evaluate patient for admission for observation and IR placement of Pleurx catheter. [VB]   Risk Decision regarding hospitalization.    Final Clinical Impression(s) / ED Diagnoses Final diagnoses:  Dyspnea,  unspecified type  Pleural effusion    Rx / DC Orders ED Discharge Orders     None         Elgie Congo, MD 11/29/22 2354

## 2022-11-30 DIAGNOSIS — R0600 Dyspnea, unspecified: Secondary | ICD-10-CM | POA: Diagnosis not present

## 2022-11-30 DIAGNOSIS — J90 Pleural effusion, not elsewhere classified: Secondary | ICD-10-CM | POA: Diagnosis not present

## 2022-11-30 LAB — CBC
HCT: 29.4 % — ABNORMAL LOW (ref 39.0–52.0)
Hemoglobin: 8.7 g/dL — ABNORMAL LOW (ref 13.0–17.0)
MCH: 23.8 pg — ABNORMAL LOW (ref 26.0–34.0)
MCHC: 29.6 g/dL — ABNORMAL LOW (ref 30.0–36.0)
MCV: 80.5 fL (ref 80.0–100.0)
Platelets: 322 10*3/uL (ref 150–400)
RBC: 3.65 MIL/uL — ABNORMAL LOW (ref 4.22–5.81)
RDW: 15 % (ref 11.5–15.5)
WBC: 6.2 10*3/uL (ref 4.0–10.5)
nRBC: 0 % (ref 0.0–0.2)

## 2022-11-30 LAB — BASIC METABOLIC PANEL
Anion gap: 8 (ref 5–15)
BUN: 33 mg/dL — ABNORMAL HIGH (ref 8–23)
CO2: 23 mmol/L (ref 22–32)
Calcium: 9 mg/dL (ref 8.9–10.3)
Chloride: 106 mmol/L (ref 98–111)
Creatinine, Ser: 2.04 mg/dL — ABNORMAL HIGH (ref 0.61–1.24)
GFR, Estimated: 32 mL/min — ABNORMAL LOW (ref >60)
Glucose, Bld: 100 mg/dL — ABNORMAL HIGH (ref 70–99)
Potassium: 4.3 mmol/L (ref 3.5–5.1)
Sodium: 137 mmol/L (ref 135–145)

## 2022-11-30 MED ORDER — PHYTONADIONE 1 MG/0.5 ML ORAL SOLUTION
2.0000 mg | Freq: Once | ORAL | Status: AC
  Administered 2022-12-01: 2 mg via ORAL
  Filled 2022-11-30 (×2): qty 1

## 2022-11-30 NOTE — Progress Notes (Signed)
PROGRESS NOTE    [name]  OJJ:[id number] DOB: August 23, 1941 DOA: 11/29/2022 PCP: Shon Baton, MD    Brief Narrative:  [name] is a 82 y.o. 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 Collene Mares in February 2023, status post pacemaker, obesity, diabetes mellitus with a last A1c of 6.1, recently discharged from the hospital for recurrent pleural effusion which is probably malignant they drained about 1.3 L and he relates his breathing improved.  He denies productive cough fever or sick contacts.  He relates to significant decreased oral intake antibiotic 10 pound weight gain in the last 2 weeks.    Assessment and Plan:  Recurrent malignant pleural effusion/pleuritic chest pain -outpatient plan is for pleurex -will hold coumadin and reverse INR in anticipation for pleurex -afebrile with no cough no leukocytosis is very unlikely there is infectious etiology. His pain is pleuritic with make ACS unlikely there is likely due to his current pleural effusion. -not on O2   Permanent atrial fibrillation/  Complete heart block (HCC)/  Pacemaker-[employer]: Rate control, hold Coumadin check INR daily. -will dose again with vit K in anticipation of pleurex catheter.   CKD (chronic kidney disease), stage IV (HCC) Creatinine seems to be at baseline.   Infiltrative high-grade papillary urothelial carcinoma of the renal pelvis: Status post resection, he does not want any chemotherapy due to his life expectancy that was explained to him. -seems to be set up with hospice  Obesity Estimated body mass index is 33.36 kg/m as calculated from the following:   Height as of this encounter: 6' (1.829 m).   Weight as of this encounter: 111.6 kg.   DVT prophylaxis:     Code Status: DNR Family Communication:    Disposition Plan:  Level of care: Med-Surg Status is: Inpatient Remains inpatient appropriate because: needs pleurex    Consultants:  IR    Subjective: Not sleeping well  Objective: Vitals:   11/29/22 2126 11/30/22 0012 11/30/22 0433 11/30/22 0916  BP: (!) 126/105 111/62 98/60 103/63  Pulse: 82 75 70 72  Resp: '20 18 20 14  '$ Temp: 98.5 F (36.9 C)   98 F (36.7 C)  TempSrc: Oral   Oral  SpO2: 98% 94% 93% 97%  Weight:      Height:       No intake or output data in the 24 hours ending 11/30/22 1149 Filed Weights   11/29/22 1254  Weight: 111.6 kg    Examination:   General: Appearance:    Obese male in no acute distress     Lungs:     respirations unlabored  Heart:    Normal heart rate.    MS:   All extremities are intact.    Neurologic:   Awake, alert       Data Reviewed: I have personally reviewed following labs and imaging studies  CBC: Recent Labs  Lab 11/26/22 1432 11/29/22 1320 11/30/22 0304  WBC 7.1 7.3 6.2  NEUTROABS 5.3 5.1  --   HGB 9.2* 9.4* 8.7*  HCT 30.3* 30.3* 29.4*  MCV 79.3* 78.5* 80.5  PLT 342 359 937   Basic Metabolic Panel: Recent Labs  Lab 11/26/22 1432 11/29/22 1320 11/30/22 0304  NA 140 136 137  K 4.4 4.3 4.3  CL 106 104 106  CO2 '25 23 23  '$ GLUCOSE 121*  126* 100*  BUN 45* 31* 33*  CREATININE 2.44* 2.06* 2.04*  CALCIUM 9.2 9.2 9.0   GFR: Estimated Creatinine Clearance: 37.3 mL/min (A) (by C-G formula based on SCr of 2.04 mg/dL (H)). Liver Function Tests: Recent Labs  Lab 11/26/22 1432 11/29/22 1320  AST 14* 15  ALT 11 10  ALKPHOS 89 90  BILITOT 0.4 0.4  PROT 7.2 6.8  ALBUMIN 3.2* 2.9*   No results for input(s): "LIPASE", "AMYLASE" in the last 168 hours. No results for input(s): "AMMONIA" in the last 168 hours. Coagulation Profile: Recent Labs  Lab 11/29/22 1320 11/29/22 2328  INR 2.8* 3.0*   Cardiac Enzymes: No results for input(s): "CKTOTAL", "CKMB", "CKMBINDEX", "TROPONINI" in the last 168  hours. BNP (last 3 results) No results for input(s): "PROBNP" in the last 8760 hours. HbA1C: No results for input(s): "HGBA1C" in the last 72 hours. CBG: No results for input(s): "GLUCAP" in the last 168 hours. Lipid Profile: No results for input(s): "CHOL", "HDL", "LDLCALC", "TRIG", "CHOLHDL", "LDLDIRECT" in the last 72 hours. Thyroid Function Tests: No results for input(s): "TSH", "T4TOTAL", "FREET4", "T3FREE", "THYROIDAB" in the last 72 hours. Anemia Panel: No results for input(s): "VITAMINB12", "FOLATE", "FERRITIN", "TIBC", "IRON", "RETICCTPCT" in the last 72 hours. Sepsis Labs: No results for input(s): "PROCALCITON", "LATICACIDVEN" in the last 168 hours.  No results found for this or any previous visit (from the past 240 hour(s)).       Radiology Studies: DG Chest 2 View  Result Date: 11/29/2022 CLINICAL DATA:  Chest pain and shortness of breath. EXAM: CHEST - 2 VIEW COMPARISON:  Chest x-ray dated 11/15/2022 FINDINGS: Heart size and mediastinal contours are stable. RIGHT chest wall pacemaker/ICD apparatus appears stable in position. Increased opacity at the RIGHT lung base. LEFT lung remains clear. No pneumothorax is seen. No acute-appearing osseous abnormality. IMPRESSION: Increased opacity at the RIGHT lung base, likely moderate-sized pleural effusion and associated compressive atelectasis. Electronically Signed   By: Franki Cabot M.D.   On: 11/29/2022 14:19        Scheduled Meds:  allopurinol  300 mg Oral Daily   carvedilol  3.125 mg Oral BID   furosemide  20 mg Oral Daily   phytonadione  2 mg Oral Once   sodium chloride flush  3 mL Intravenous Q12H   Continuous Infusions:  sodium chloride       LOS: 1 day    Time spent: 45 minutes spent on chart review, discussion with nursing staff, consultants, updating family and interview/physical exam; more than 50% of that time was spent in counseling and/or coordination of care.    Geradine Girt, DO [facility] Available via Epic secure chat 7am-7pm After these hours, please refer to coverage provider listed on [email or web address] 11/30/2022, 11:49 AM

## 2022-12-01 DIAGNOSIS — J910 Malignant pleural effusion: Secondary | ICD-10-CM | POA: Diagnosis not present

## 2022-12-01 LAB — PROTIME-INR
INR: 2.9 — ABNORMAL HIGH (ref 0.8–1.2)
INR: 2.3 — ABNORMAL HIGH (ref 0.8–1.2)
Prothrombin Time: 29.9 seconds — ABNORMAL HIGH (ref 11.4–15.2)
Prothrombin Time: 25.2 seconds — ABNORMAL HIGH (ref 11.4–15.2)

## 2022-12-01 LAB — BASIC METABOLIC PANEL
Anion gap: 13 (ref 5–15)
BUN: 35 mg/dL — ABNORMAL HIGH (ref 8–23)
CO2: 23 mmol/L (ref 22–32)
Calcium: 9 mg/dL (ref 8.9–10.3)
Chloride: 102 mmol/L (ref 98–111)
Creatinine, Ser: 2.3 mg/dL — ABNORMAL HIGH (ref 0.61–1.24)
GFR, Estimated: 28 mL/min — ABNORMAL LOW (ref >60)
Glucose, Bld: 109 mg/dL — ABNORMAL HIGH (ref 70–99)
Potassium: 4.2 mmol/L (ref 3.5–5.1)
Sodium: 138 mmol/L (ref 135–145)

## 2022-12-01 LAB — CBC
HCT: 29.6 % — ABNORMAL LOW (ref 39.0–52.0)
Hemoglobin: 8.6 g/dL — ABNORMAL LOW (ref 13.0–17.0)
MCH: 23.7 pg — ABNORMAL LOW (ref 26.0–34.0)
MCHC: 29.1 g/dL — ABNORMAL LOW (ref 30.0–36.0)
MCV: 81.5 fL (ref 80.0–100.0)
Platelets: 308 10*3/uL (ref 150–400)
RBC: 3.63 MIL/uL — ABNORMAL LOW (ref 4.22–5.81)
RDW: 15.2 % (ref 11.5–15.5)
WBC: 6 10*3/uL (ref 4.0–10.5)
nRBC: 0 % (ref 0.0–0.2)

## 2022-12-01 MED ORDER — PHYTONADIONE 5 MG PO TABS
2.5000 mg | ORAL_TABLET | Freq: Once | ORAL | Status: AC
  Administered 2022-12-01: 2.5 mg via ORAL
  Filled 2022-12-01 (×2): qty 1

## 2022-12-01 MED ORDER — PHYTONADIONE 5 MG PO TABS
5.0000 mg | ORAL_TABLET | Freq: Once | ORAL | Status: AC
  Administered 2022-12-01: 5 mg via ORAL
  Filled 2022-12-01: qty 1

## 2022-12-01 MED ORDER — CEFAZOLIN SODIUM-DEXTROSE 2-4 GM/100ML-% IV SOLN: 2.0000 g | INTRAVENOUS | Status: AC

## 2022-12-01 NOTE — Consult Note (Signed)
Chief Complaint: Patient was seen in consultation today for recurrent pleural effusion  Referring Physician(s): Dr. Eliseo Squires  Supervising Physician: Ruthann Cancer  Patient Status: [name]  History of Present Illness: [name] is a 82 y.o. male with past medical history of arthritis, CKD, depression, DM, a fib, CHB s/p pacemaker placement on Coumadin at home, and high grade urothelial cancer. Admitted with shortness of breath.   He underwent thoracentesis 11/15/22 and appears pleural effusion has recurred based on CXR.  He has elected to return home with hospice and is therefore a candidate for PleurX placement for management of his recurrent effusion.   PA met with patient at bedside.  States he cannot get comfortable due to fluid burden and shortness of breath. He is agreeable to PleurX placement and understands the risks of the procedure as well as ongoing risk of infection with tunneled catheter.  Past Medical History:  Diagnosis Date   Arthritis    CHB (complete heart block) ([place])    patient has permanent pacemaker   Chronic anticoagulation    CKD (chronic kidney disease), stage III ([place])    per hx in OV note from Dr. Shon Baton, 08/06/21   Depression    Diabetes mellitus without complication ([place])    Dyspnea    Gout    History of echocardiogram    Echo 9/16:  EF 55-60%, no RWMA, mild MR, mild LAE, trivial PI   History of kidney stones    HOH (hard of hearing)    wears hearing aids   HTN (hypertension)    LV dysfunction    EF down to 30% per echo Feb 2013 -  started on ARB and low dose beta blocker   Morbid obesity ([place])    Nephrolithiasis    Occasional tremors 10/08/2021   Pt states that he has tremors in his hands, and that his doctor told him it was from old age.   Osteoarthritis    Pacemaker 11/17/2008   Permanent atrial fibrillation ([place])    failed cardioversion in April of 2010   Prostate CA [facility])    Thrombophilia [facility])    Urosepsis    Wears glasses     reading only   Wears partial dentures     Past Surgical History:  Procedure Laterality Date   CARDIOVERSION  02/15/2009   failed   CYSTOSCOPY W/ URETERAL STENT PLACEMENT Left 12/25/2021   Procedure: CYSTOSCOPY WITH RETROGRADE PYELOGRAM/URETERAL STENT PLACEMENT;  Surgeon: Alexis Frock, MD;  Location: [facility];  Service: Urology;  Laterality: Left;   CYSTOSCOPY WITH RETROGRADE PYELOGRAM, URETEROSCOPY AND STENT PLACEMENT Left 10/14/2021   Procedure: CYSTOSCOPY WITH RETROGRADE PYELOGRAM, URETEROSCOPY WITH  BIOPSY  AND STENT PLACEMENT;  Surgeon: Franchot Gallo, MD;  Location: [facility];  Service: Urology;  Laterality: Left;   EP IMPLANTABLE DEVICE N/A 08/17/2015   Procedure: PPM Generator Changeout;  Surgeon: Thompson Grayer, MD;  Location: [place] [facility];  Service: Cardiovascular;  Laterality: N/A;   PACEMAKER PLACEMENT  11/17/2008   [employer]   RADIOACTIVE SEED IMPLANT     1990's ?   ROBOT ASSITED LAPAROSCOPIC NEPHROURETERECTOMY Left 12/25/2021   Procedure: XI ROBOT ASSITED LAPAROSCOPIC NEPHROURETERECTOMY, umblical hernia repair;  Surgeon: Alexis Frock, MD;  Location: [facility];  Service: Urology;  Laterality: Left;   SHOULDER SURGERY Left    pt unsure of date   TOTAL KNEE ARTHROPLASTY Left    many years ago per pat   TOTAL KNEE ARTHROPLASTY Right 05/14/2021  Procedure: TOTAL KNEE ARTHROPLASTY;  Surgeon: Paralee Cancel, MD;  Location: [facility];  Service: Orthopedics;  Laterality: Right;   WISDOM TOOTH EXTRACTION      Allergies: Codeine, Oxycodone, Sertraline hcl, and Sitagliptin  Medications: Prior to Admission medications   Medication Sig Start Date End Date Taking? Authorizing Provider  carvedilol (COREG) 3.125 MG tablet Take 1 tablet (3.125 mg total) by mouth 2 (two) times daily. Please schedule overdue appt for future refills. 2nd attempt. Patient taking differently: Take 3.125 mg by mouth 2 (two) times daily. 12/22/18  Yes Allred, Jeneen Rinks, MD  furosemide  (LASIX) 20 MG tablet Take 20 mg by mouth daily as needed for edema.   Yes [provider]  losartan (COZAAR) 50 MG tablet TAKE 1 TABLET BY MOUTH ONCE DAILY.  PLEASE MAKE APPT WITH DR. ALLRED BEFORE ANYMORE REFILLS. Patient taking differently: Take 50 mg by mouth daily. 10/01/20  Yes Allred, Jeneen Rinks, MD  metFORMIN (GLUCOPHAGE) 500 MG tablet Take 500 mg by mouth at bedtime. 04/20/15  Yes [provider]  warfarin (COUMADIN) 5 MG tablet Take 1.5 tablets (7.5 mg total) by mouth daily. Take 7.5 mg by mouth daily Patient taking differently: Take 7.5 mg by mouth daily. 12/30/21  Yes Abu-Salha, Inda Castle, MD  allopurinol (ZYLOPRIM) 300 MG tablet TAKE ONE TABLET BY MOUTH ONCE DAILY Patient not taking: Reported on 11/14/2022 07/02/15   Nahser, Wonda Cheng, MD  methocarbamol (ROBAXIN) 500 MG tablet Take 1 tablet (500 mg total) by mouth every 6 (six) hours as needed for muscle spasms. Patient not taking: Reported on 11/29/2022 05/15/21   Irving Copas, PA-C     Family History  Problem Relation Age of Onset   Heart attack Father 90   Healthy Mother    Healthy Brother     Social History   Socioeconomic History   Marital status: Widowed    Spouse name: Not on file   Number of children: 1   Years of education: Not on file   Highest education level: Not on file  Occupational History   Occupation: Retired Magazine features editor: RETIRED  Tobacco Use   Smoking status: Never   Smokeless tobacco: Never  Vaping Use   Vaping Use: Never used  Substance and Sexual Activity   Alcohol use: No   Drug use: No   Sexual activity: Not on file  Other Topics Concern   Not on file  Social History Narrative   Worked as a Administrator.  Lives near sons.  Has girlfriend.  Never smoker.  No alcohol.   Social Determinants of Health   Financial Resource Strain: Not on file  Food Insecurity: No Food Insecurity (11/29/2022)   Hunger Vital Sign    Worried About Running Out of Food in the Last Year:  Never true    Ran Out of Food in the Last Year: Never true  Transportation Needs: No Transportation Needs (11/29/2022)   PRAPARE - Hydrologist (Medical): No    Lack of Transportation (Non-Medical): No  Physical Activity: Not on file  Stress: Not on file  Social Connections: Not on file     Review of Systems: A 12 point ROS discussed and pertinent positives are indicated in the HPI above.  All other systems are negative.  Review of Systems  Constitutional:  Negative for fatigue.  Respiratory:  Positive for cough and shortness of breath.   Cardiovascular:  Negative for chest pain.  Gastrointestinal:  Negative for abdominal  pain.  Musculoskeletal:  Negative for back pain.  Psychiatric/Behavioral:  Negative for behavioral problems and confusion.     Vital Signs: BP (!) 105/53 (BP Location: Left Arm)   Pulse 74   Temp 98.4 F (36.9 C) (Oral)   Resp 18   Ht 6' (1.829 m)   Wt 246 lb (111.6 kg)   SpO2 95%   BMI 33.36 kg/m   Physical Exam Vitals and nursing note reviewed.  Constitutional:      General: He is not in acute distress.    Appearance: He is well-developed. He is not ill-appearing.  Pulmonary:     Effort: Tachypnea present.     Breath sounds: Examination of the right-middle field reveals decreased breath sounds. Examination of the right-lower field reveals decreased breath sounds. Decreased breath sounds present.  Musculoskeletal:     Cervical back: Normal range of motion.  Skin:    General: Skin is warm and dry.  Neurological:     General: No focal deficit present.     Mental Status: He is alert.  Psychiatric:        Mood and Affect: Mood normal.        Behavior: Behavior normal.      MD Evaluation Airway: WNL Heart: WNL Abdomen: WNL Chest/ Lungs: WNL ASA  Classification: 3 Mallampati/Airway Score: Two   Imaging: DG Chest 2 View  Result Date: 11/29/2022 CLINICAL DATA:  Chest pain and shortness of breath. EXAM: CHEST -  2 VIEW COMPARISON:  Chest x-ray dated 11/15/2022 FINDINGS: Heart size and mediastinal contours are stable. RIGHT chest wall pacemaker/ICD apparatus appears stable in position. Increased opacity at the RIGHT lung base. LEFT lung remains clear. No pneumothorax is seen. No acute-appearing osseous abnormality. IMPRESSION: Increased opacity at the RIGHT lung base, likely moderate-sized pleural effusion and associated compressive atelectasis. Electronically Signed   By: Franki Cabot M.D.   On: 11/29/2022 14:19   VAS [place] LOWER EXTREMITY VENOUS (DVT)  Result Date: 11/17/2022  Lower Venous DVT Study Patient Name:  CAMAR GUYTON  Date of Exam:   11/15/2022 Medical Rec #: [id number]      Accession #:    [id number] Date of Birth: 1941/02/13      Patient Gender: M Patient Age:   27 years Exam Location:  [facility] Procedure:      VAS [place] LOWER EXTREMITY VENOUS (DVT) Referring Phys: Myrene Buddy --------------------------------------------------------------------------------  Indications: Swelling.  Limitations: Poor ultrasound/tissue interfaceat calf level. Comparison Study: No prior studies. Performing Technologist: Darlin Coco RDMS, RVT  Examination Guidelines: A complete evaluation includes B-mode imaging, spectral Doppler, color Doppler, and power Doppler as needed of all accessible portions of each vessel. Bilateral testing is considered an integral part of a complete examination. Limited examinations for reoccurring indications may be performed as noted. The reflux portion of the exam is performed with the patient in reverse Trendelenburg.  +-----+---------------+---------+-----------+----------+--------------+ RIGHTCompressibilityPhasicitySpontaneityPropertiesThrombus Aging +-----+---------------+---------+-----------+----------+--------------+ CFV  Full           Yes      Yes                                 +-----+---------------+---------+-----------+----------+--------------+    +---------+---------------+---------+-----------+----------+-------------------+ LEFT     CompressibilityPhasicitySpontaneityPropertiesThrombus Aging      +---------+---------------+---------+-----------+----------+-------------------+ CFV      Full           Yes      Yes                                      +---------+---------------+---------+-----------+----------+-------------------+  SFJ      Full                                                             +---------+---------------+---------+-----------+----------+-------------------+ FV Prox  Full                                                             +---------+---------------+---------+-----------+----------+-------------------+ FV Mid   Full                                                             +---------+---------------+---------+-----------+----------+-------------------+ FV DistalFull                                                             +---------+---------------+---------+-----------+----------+-------------------+ PFV      Full                                                             +---------+---------------+---------+-----------+----------+-------------------+ POP      Full           Yes      Yes                                      +---------+---------------+---------+-----------+----------+-------------------+ PTV      Full                                                             +---------+---------------+---------+-----------+----------+-------------------+ PERO     Full                                         Some segments not                                                         well visualized  secondary to tissue                                                       properties          +---------+---------------+---------+-----------+----------+-------------------+      Summary: RIGHT: - No evidence of common femoral vein obstruction.  LEFT: - There is no evidence of deep vein thrombosis in the lower extremity. However, portions of this examination were limited- see technologist comments above.  - No cystic structure found in the popliteal fossa.  *See table(s) above for measurements and observations. Electronically signed by Jamelle Haring on 11/17/2022 at 10:13:03 AM.    Final    CT CHEST WO CONTRAST  Result Date: 11/15/2022 CLINICAL DATA:  82 year old male with pleural effusion. EXAM: CT CHEST WITHOUT CONTRAST TECHNIQUE: Multidetector CT imaging of the chest was performed following the standard protocol without IV contrast. RADIATION DOSE REDUCTION: This exam was performed according to the departmental dose-optimization program which includes automated exposure control, adjustment of the mA and/or kV according to patient size and/or use of iterative reconstruction technique. COMPARISON:  11/15/2022 chest radiograph, 09/16/2021 abdominal and pelvic CT and prior studies. FINDINGS: Please note that parenchymal and vascular abnormalities may be missed as intravenous contrast was not administered. Cardiovascular: Heart size is within normal limits. Pacemaker leads are present. Coronary artery and aortic atherosclerotic calcifications are present. There is no evidence of thoracic aortic aneurysm or pericardial effusion. Mediastinum/Nodes: No enlarged mediastinal or axillary lymph nodes. Thyroid gland, trachea, and esophagus demonstrate no significant findings. Lungs/Pleura: A 3.6 x 2 cm soft tissue density along the anterior RIGHT UPPER lung appears to be extra pulmonary and likely pleural (images 43-50: Series 3). Probable focal RIGHT pleural thickening (83:3) is also identified. A small to moderate RIGHT pleural effusion is noted with RIGHT LOWER lung atelectasis. A 7 mm LEFT LOWER lobe nodule (125:5) and a 4 mm LEFT LOWER lobe nodule (104:5) are present. Minimal LEFT basilar  atelectasis noted. There is no evidence of pneumothorax or LEFT pleural effusion. Upper Abdomen: A 2.5 x 1.2 cm LEFT adrenal nodule is new from 09/16/2021. Musculoskeletal: No acute or suspicious bony abnormalities are noted. IMPRESSION: 1. 3.6 x 2 cm soft tissue along the anterior RIGHT UPPER lung, likely pleural. Focal RIGHT pleural thickening more inferiorly. Small to moderate RIGHT pleural effusion with RIGHT LOWER lung atelectasis. This is suspicious for malignancy/malignant effusion. 2. 2.5 x 1.2 cm LEFT adrenal nodule, new from 09/16/2021, possibly representing metastatic disease. 3. 7 mm LEFT LOWER lobe nodule and 4 mm LEFT LOWER lobe nodule. Indeterminate but may represent metastases. 4. PET-CT may be helpful for further evaluation of the above findings. 5. Coronary artery disease and Aortic Atherosclerosis (ICD10-I70.0). Electronically Signed   By: Margarette Canada M.D.   On: 11/15/2022 14:27   ECHOCARDIOGRAM COMPLETE  Result Date: 11/15/2022    ECHOCARDIOGRAM REPORT   Patient Name:   ZURICH CARRENO Date of Exam: 11/15/2022 Medical Rec #:  [id number]     Height:       72.0 in Accession #:    [id number]    Weight:       246.5 lb Date of Birth:  01-25-1941     BSA:          2.328 m Patient Age:    54 years  BP:           97/56 mmHg Patient Gender: M             HR:           75 bpm. Exam Location:  Inpatient Procedure: 2D Echo Indications:    dyspnea  History:        Patient has prior history of Echocardiogram examinations, most                 recent 11/15/2015. Pacemaker, chronic kidney disease,                 Arrythmias:Atrial Fibrillation; Risk Factors:Hypertension.  Sonographer:    Johny Chess RDCS Referring Phys: [id number] CHRISTOPHER P DANFORD  Sonographer Comments: Image acquisition challenging due to respiratory motion. IMPRESSIONS  1. Left ventricular ejection fraction, by estimation, is 60 to 65%. The left ventricle has normal function. The left ventricle has no regional wall motion  abnormalities. Left ventricular diastolic parameters are indeterminate.  2. Right ventricular systolic function is normal. The right ventricular size is normal. There is normal pulmonary artery systolic pressure.  3. Left atrial size was mildly dilated.  4. The mitral valve is normal in structure. Mild mitral valve regurgitation. No evidence of mitral stenosis.  5. The aortic valve is tricuspid. There is mild calcification of the aortic valve. Aortic valve regurgitation is not visualized. Aortic valve sclerosis is present, with no evidence of aortic valve stenosis.  6. The inferior vena cava is normal in size with greater than 50% respiratory variability, suggesting right atrial pressure of 3 mmHg. FINDINGS  Left Ventricle: Left ventricular ejection fraction, by estimation, is 60 to 65%. The left ventricle has normal function. The left ventricle has no regional wall motion abnormalities. The left ventricular internal cavity size was normal in size. There is  no left ventricular hypertrophy. Left ventricular diastolic parameters are indeterminate. Right Ventricle: The right ventricular size is normal. No increase in right ventricular wall thickness. Right ventricular systolic function is normal. There is normal pulmonary artery systolic pressure. The tricuspid regurgitant velocity is 2.74 m/s, and  with an assumed right atrial pressure of 3 mmHg, the estimated right ventricular systolic pressure is 03.4 mmHg. Left Atrium: Left atrial size was mildly dilated. Right Atrium: Right atrial size was normal in size. Pericardium: There is no evidence of pericardial effusion. Mitral Valve: The mitral valve is normal in structure. Mild mitral valve regurgitation. No evidence of mitral valve stenosis. Tricuspid Valve: The tricuspid valve is normal in structure. Tricuspid valve regurgitation is trivial. No evidence of tricuspid stenosis. Aortic Valve: The aortic valve is tricuspid. There is mild calcification of the aortic  valve. Aortic valve regurgitation is not visualized. Aortic valve sclerosis is present, with no evidence of aortic valve stenosis. Pulmonic Valve: The pulmonic valve was normal in structure. Pulmonic valve regurgitation is trivial. No evidence of pulmonic stenosis. Aorta: The aortic root is normal in size and structure. Venous: The inferior vena cava is normal in size with greater than 50% respiratory variability, suggesting right atrial pressure of 3 mmHg. IAS/Shunts: No atrial level shunt detected by color flow Doppler. Additional Comments: A device lead is visualized in the right ventricle.  LEFT VENTRICLE PLAX 2D LVIDd:         4.50 cm LVIDs:         3.20 cm LV PW:         1.10 cm LV IVS:        0.80 cm  LVOT diam:     2.30 cm LV SV:         82 LV SV Index:   35 LVOT Area:     4.15 cm  RIGHT VENTRICLE RV S prime:     13.80 cm/s TAPSE (M-mode): 1.8 cm LEFT ATRIUM             Index        RIGHT ATRIUM           Index LA diam:        4.30 cm 1.85 cm/m   RA Area:     20.70 cm LA Vol (A2C):   88.1 ml 37.84 ml/m  RA Volume:   59.40 ml  25.51 ml/m LA Vol (A4C):   72.6 ml 31.18 ml/m LA Biplane Vol: 83.0 ml 35.65 ml/m  AORTIC VALVE LVOT Vmax:   110.00 cm/s LVOT Vmean:  73.100 cm/s LVOT VTI:    0.197 m  AORTA Ao Root diam: 3.10 cm Ao Asc diam:  3.70 cm TRICUSPID VALVE TR Peak grad:   30.0 mmHg TR Vmax:        274.00 cm/s  SHUNTS Systemic VTI:  0.20 m Systemic Diam: 2.30 cm Candee Furbish MD Electronically signed by Candee Furbish MD Signature Date/Time: 11/15/2022/12:39:26 PM    Final    US THORACENTESIS ASP PLEURAL SPACE W/IMG GUIDE  Result Date: 11/15/2022 INDICATION: 82 year old male. History of CHF. admitted for shortness of breath. Found to have a right-sided pleural effusion. Request is for therapeutic and diagnostic right-sided thoracentesis EXAM: ULTRASOUND GUIDED RIGHT-SIDED THERAPEUTIC AND DIAGNOSTIC THORACENTESIS MEDICATIONS: Lidocaine 1% 10 mL COMPLICATIONS: None immediate. PROCEDURE: An ultrasound guided  thoracentesis was thoroughly discussed with the patient and questions answered. The benefits, risks, alternatives and complications were also discussed. The patient understands and wishes to proceed with the procedure. Written consent was obtained. Ultrasound was performed to localize and mark an adequate pocket of fluid in the right chest. The area was then prepped and draped in the normal sterile fashion. 1% Lidocaine was used for local anesthesia. Under ultrasound guidance a 6 Fr Safe-T-Centesis catheter was introduced. Thoracentesis was performed. The catheter was removed and a dressing applied. FINDINGS: A total of approximately 1.3 L of straw-colored fluid was removed. Samples were sent to the laboratory as requested by the clinical team. IMPRESSION: Successful ultrasound guided therapeutic and diagnostic right-sided thoracentesis yielding 1.3 L of pleural fluid. Read by: Rushie Nyhan, NP Electronically Signed   By: Ruthann Cancer M.D.   On: 11/15/2022 11:22   DG CHEST PORT 1 VIEW  Result Date: 11/15/2022 CLINICAL DATA:  Status post thoracentesis EXAM: PORTABLE CHEST 1 VIEW COMPARISON:  November 14, 2022 FINDINGS: No pneumothorax after right thoracentesis. The right pleural effusion has improved but persists. Opacity underlies the effusion. No other interval changes. IMPRESSION: 1. No pneumothorax after right thoracentesis. 2. The right pleural effusion has improved but persists. Opacity underlies the effusion. 3. No other changes. Electronically Signed   By: Dorise Bullion III M.D.   On: 11/15/2022 10:30   DG Chest 2 View  Result Date: 11/14/2022 CLINICAL DATA:  Pleural effusion shortness of breath EXAM: CHEST - 2 VIEW COMPARISON:  11/14/2022, 12/18/2008 FINDINGS: Right-sided pacing device as before. Cardiomegaly with vascular congestion. Moderate right-sided pleural effusion with airspace disease at the right base. No pneumothorax. IMPRESSION: Cardiomegaly with vascular congestion. Moderate  right pleural effusion with airspace disease at the right base which may be due to atelectasis or pneumonia. Electronically Signed   By:  Donavan Foil M.D.   On: 11/14/2022 19:11   DG Chest Portable 1 View  Result Date: 11/14/2022 CLINICAL DATA:  Shortness of breath. EXAM: PORTABLE CHEST 1 VIEW COMPARISON:  December 18, 2008. FINDINGS: Stable cardiomediastinal silhouette. Left lung is clear. Right-sided pacemaker is noted. Interval development of mild to moderate right pleural effusion with associated atelectasis or infiltrate. Bony thorax is unremarkable. IMPRESSION: Mild to moderate right pleural effusion with associated right basilar atelectasis or infiltrate. Electronically Signed   By: Marijo Conception M.D.   On: 11/14/2022 16:08    Labs:  CBC: Recent Labs    11/26/22 1432 11/29/22 1320 11/30/22 0304 12/01/22 0306  WBC 7.1 7.3 6.2 6.0  HGB 9.2* 9.4* 8.7* 8.6*  HCT 30.3* 30.3* 29.4* 29.6*  PLT 342 359 322 308    COAGS: Recent Labs    12/25/21 1035 11/14/22 1939 11/29/22 1320 11/29/22 2328 12/01/22 0306 12/01/22 1830  INR 1.2   < > 2.8* 3.0* 2.9* 2.3*  APTT 28  --   --   --   --   --    < > = values in this interval not displayed.    BMP: Recent Labs    11/26/22 1432 11/29/22 1320 11/30/22 0304 12/01/22 0306  NA 140 136 137 138  K 4.4 4.3 4.3 4.2  CL 106 104 106 102  CO2 '25 23 23 23  '$ GLUCOSE 121* 126* 100* 109*  BUN 45* 31* 33* 35*  CALCIUM 9.2 9.2 9.0 9.0  CREATININE 2.44* 2.06* 2.04* 2.30*  GFRNONAA 26* 32* 32* 28*    LIVER FUNCTION TESTS: Recent Labs    11/14/22 1521 11/15/22 0247 11/26/22 1432 11/29/22 1320  BILITOT 0.6 0.4 0.4 0.4  AST 16 14* 14* 15  ALT '11 9 11 10  '$ ALKPHOS 82 82 89 90  PROT 6.7 6.4* 7.2 6.8  ALBUMIN 2.7* 2.8* 3.2* 2.9*    TUMOR MARKERS: No results for input(s): "AFPTM", "CEA", "CA199", "CHROMGRNA" in the last 8760 hours.  Assessment and Plan: Recurrent pleural effusion Patient s/p thoracentesis 11/15/22 now readmitted  with recurrent pleural effusion in the setting of high-grade urothelial carcinoma.  Patient has plans to discharge home with hospice and has requested PleurX catheter.  PA to bedside.  Discussed risks, benefits, and goals.  He is agreeable to proceed.  NPO p MN.   INR elevated due to coumadin use at home.  Coumadin has been held.  VItamin K administered today.  Repeat INR ordered for tomorrow AM.   2g Ancef ordered for procedure.   Mr. Spivak is short of breath during visit.  Oxygenating well but complains fluid is preventing him from lying in bed or ambulating.  He requests fluid removal tomorrow via thoracentesis or PleurX placement.   Risks and benefits discussed with the patient including, but not limited to bleeding, infection, vascular injury, pneumothorax which may require chest tube placement, air embolism or even death  All of the patient's questions were answered, patient is agreeable to proceed. Consent signed and in chart.  Thank you for this interesting consult.  I greatly enjoyed meeting ANDREY MCCASKILL and look forward to participating in their care.  A copy of this report was sent to the requesting provider on this date.  Electronically Signed: Docia Barrier, PA 12/01/2022, 10:16 PM   I spent a total of 40 Minutes    in face to face in clinical consultation, greater than 50% of which was counseling/coordinating care for recurrent pleural effusion.

## 2022-12-01 NOTE — Plan of Care (Signed)
  Problem: Clinical Measurements: Goal: Will remain free from infection Outcome: Progressing   Problem: Elimination: Goal: Will not experience complications related to bowel motility Outcome: Progressing   Problem: Pain Managment: Goal: General experience of comfort will improve Outcome: Progressing   Problem: Safety: Goal: Ability to remain free from injury will improve Outcome: Progressing   Problem: Activity: Goal: Risk for activity intolerance will decrease Outcome: Not Progressing

## 2022-12-01 NOTE — Progress Notes (Signed)
[facility] Liaison Note   [name] is a pending [facility] hospice patient.   Please call with any hospice questions or concerns.   Thank you.   Zigmund Gottron, RN [facility] Liaison [phone]

## 2022-12-01 NOTE — Progress Notes (Signed)
PROGRESS NOTE    [name]  DJS:[id number] DOB: 09-06-1941 DOA: 11/29/2022 PCP: Shon Baton, MD    Brief Narrative:  [name] is a 82 y.o. 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 Collene Mares in February 2023, status post pacemaker, obesity, diabetes mellitus with a last A1c of 6.1, recently discharged from the hospital for recurrent pleural effusion which is probably malignant they drained about 1.3 L and he relates his breathing improved.  He denies productive cough fever or sick contacts.  He relates to significant decreased oral intake antibiotic 10 pound weight gain in the last 2 weeks.    Assessment and Plan:  Recurrent malignant pleural effusion/pleuritic chest pain -outpatient plan is for pleurex -will hold coumadin and reverse INR in anticipation for pleurex -afebrile with no cough no leukocytosis is very unlikely there is infectious etiology. His pain is pleuritic with make ACS unlikely there is likely due to his current pleural effusion. -PRN O2   Permanent atrial fibrillation/  Complete heart block (HCC)/  Pacemaker-Medtronic: Rate control, hold Coumadin check INR daily. -INR still elevated this AM-- appears patient did not get vit K dose ordered 1/14.   -will have to dose today and recheck this Pm   CKD (chronic kidney disease), stage IV (HCC) Creatinine slightly up- ? From hypotension -recheck in AM   Infiltrative high-grade papillary urothelial carcinoma of the renal pelvis: Status post resection, he does not want any chemotherapy due to his life expectancy that was explained to him. -seems to be set up with hospice  Obesity Estimated body mass index is 33.36 kg/m as calculated from the following:   Height as of this encounter: 6' (1.829 m).   Weight as of this  encounter: 111.6 kg.   DVT prophylaxis:     Code Status: DNR   Disposition Plan:  Level of care: Med-Surg Status is: Inpatient Remains inpatient appropriate because: needs pleurex    Consultants:  IR    Subjective: Would like to go home ASAP  Objective: Vitals:   11/30/22 1900 11/30/22 2106 12/01/22 0449 12/01/22 0813  BP: 108/71 120/71 107/75 102/67  Pulse:  72 74 73  Resp:  '16 14 17  '$ Temp:  (!) 97.5 F (36.4 C) 98.1 F (36.7 C) 98.2 F (36.8 C)  TempSrc:  Oral  Oral  SpO2:  95% 98% 95%  Weight:      Height:       No intake or output data in the 24 hours ending 12/01/22 1139 Filed Weights   11/29/22 1254  Weight: 111.6 kg    Examination:   General: Appearance:    Obese male in no acute distress     Lungs:     respirations unlabored  Heart:    Normal heart rate.    MS:   All extremities are intact.    Neurologic:   Awake, alert       Data Reviewed: I have personally reviewed following labs and imaging studies  CBC: Recent Labs  Lab 11/26/22 1432 11/29/22 1320 11/30/22 0304 12/01/22 0306  WBC 7.1 7.3 6.2 6.0  NEUTROABS 5.3 5.1  --   --   HGB 9.2* 9.4* 8.7* 8.6*  HCT 30.3* 30.3* 29.4* 29.6*  MCV 79.3* 78.5* 80.5 81.5  PLT 342 359 322 885   Basic Metabolic Panel: Recent Labs  Lab 11/26/22 1432 11/29/22 1320 11/30/22 0304 12/01/22 0306  NA 140 136 137 138  K 4.4 4.3 4.3 4.2  CL 106 104 106 102  CO2 '25 23 23 23  '$ GLUCOSE 121* 126* 100* 109*  BUN 45* 31* 33* 35*  CREATININE 2.44* 2.06* 2.04* 2.30*  CALCIUM 9.2 9.2 9.0 9.0   GFR: Estimated Creatinine Clearance: 33 mL/min (A) (by C-G formula based on SCr of 2.3 mg/dL (H)). Liver Function Tests: Recent Labs  Lab 11/26/22 1432 11/29/22 1320  AST 14* 15  ALT 11 10  ALKPHOS 89 90  BILITOT 0.4 0.4  PROT 7.2 6.8  ALBUMIN 3.2* 2.9*   No results for input(s): "LIPASE", "AMYLASE" in the last 168 hours. No results for input(s): "AMMONIA" in the last 168 hours. Coagulation  Profile: Recent Labs  Lab 11/29/22 1320 11/29/22 2328 12/01/22 0306  INR 2.8* 3.0* 2.9*   Cardiac Enzymes: No results for input(s): "CKTOTAL", "CKMB", "CKMBINDEX", "TROPONINI" in the last 168 hours. BNP (last 3 results) No results for input(s): "PROBNP" in the last 8760 hours. HbA1C: No results for input(s): "HGBA1C" in the last 72 hours. CBG: No results for input(s): "GLUCAP" in the last 168 hours. Lipid Profile: No results for input(s): "CHOL", "HDL", "LDLCALC", "TRIG", "CHOLHDL", "LDLDIRECT" in the last 72 hours. Thyroid Function Tests: No results for input(s): "TSH", "T4TOTAL", "FREET4", "T3FREE", "THYROIDAB" in the last 72 hours. Anemia Panel: No results for input(s): "VITAMINB12", "FOLATE", "FERRITIN", "TIBC", "IRON", "RETICCTPCT" in the last 72 hours. Sepsis Labs: No results for input(s): "PROCALCITON", "LATICACIDVEN" in the last 168 hours.  No results found for this or any previous visit (from the past 240 hour(s)).       Radiology Studies: DG Chest 2 View  Result Date: 11/29/2022 CLINICAL DATA:  Chest pain and shortness of breath. EXAM: CHEST - 2 VIEW COMPARISON:  Chest x-ray dated 11/15/2022 FINDINGS: Heart size and mediastinal contours are stable. RIGHT chest wall pacemaker/ICD apparatus appears stable in position. Increased opacity at the RIGHT lung base. LEFT lung remains clear. No pneumothorax is seen. No acute-appearing osseous abnormality. IMPRESSION: Increased opacity at the RIGHT lung base, likely moderate-sized pleural effusion and associated compressive atelectasis. Electronically Signed   By: Franki Cabot M.D.   On: 11/29/2022 14:19        Scheduled Meds:  allopurinol  300 mg Oral Daily   carvedilol  3.125 mg Oral BID   sodium chloride flush  3 mL Intravenous Q12H   Continuous Infusions:  sodium chloride       LOS: 2 days    Time spent: 45 minutes spent on chart review, discussion with nursing staff, consultants, updating family and  interview/physical exam; more than 50% of that time was spent in counseling and/or coordination of care.    Geradine Girt, DO [facility] Available via Epic secure chat 7am-7pm After these hours, please refer to coverage provider listed on [email or web address] 12/01/2022, 11:39 AM

## 2022-12-01 NOTE — Progress Notes (Signed)
Patient DID NOT get vit K ordered on 1/14.  No reason documented.  Will not be able to have pleurex today.  JV

## 2022-12-02 ENCOUNTER — Inpatient Hospital Stay (HOSPITAL_COMMUNITY): Payer: Medicare Other

## 2022-12-02 DIAGNOSIS — J910 Malignant pleural effusion: Secondary | ICD-10-CM | POA: Diagnosis not present

## 2022-12-02 HISTORY — PX: IR PERC PLEURAL DRAIN W/INDWELL CATH W/IMG GUIDE: IMG5383

## 2022-12-02 LAB — PROTIME-INR
INR: 1.9 — ABNORMAL HIGH (ref 0.8–1.2)
Prothrombin Time: 21.2 seconds — ABNORMAL HIGH (ref 11.4–15.2)

## 2022-12-02 LAB — BASIC METABOLIC PANEL
Anion gap: 12 (ref 5–15)
BUN: 36 mg/dL — ABNORMAL HIGH (ref 8–23)
CO2: 21 mmol/L — ABNORMAL LOW (ref 22–32)
Calcium: 9 mg/dL (ref 8.9–10.3)
Chloride: 103 mmol/L (ref 98–111)
Creatinine, Ser: 2.1 mg/dL — ABNORMAL HIGH (ref 0.61–1.24)
GFR, Estimated: 31 mL/min — ABNORMAL LOW (ref >60)
Glucose, Bld: 109 mg/dL — ABNORMAL HIGH (ref 70–99)
Potassium: 4.2 mmol/L (ref 3.5–5.1)
Sodium: 136 mmol/L (ref 135–145)

## 2022-12-02 LAB — CBC
HCT: 29.4 % — ABNORMAL LOW (ref 39.0–52.0)
Hemoglobin: 8.6 g/dL — ABNORMAL LOW (ref 13.0–17.0)
MCH: 23.6 pg — ABNORMAL LOW (ref 26.0–34.0)
MCHC: 29.3 g/dL — ABNORMAL LOW (ref 30.0–36.0)
MCV: 80.8 fL (ref 80.0–100.0)
Platelets: 263 10*3/uL (ref 150–400)
RBC: 3.64 MIL/uL — ABNORMAL LOW (ref 4.22–5.81)
RDW: 15.2 % (ref 11.5–15.5)
WBC: 6.6 10*3/uL (ref 4.0–10.5)
nRBC: 0 % (ref 0.0–0.2)

## 2022-12-02 MED ORDER — FENTANYL CITRATE (PF) 100 MCG/2ML IJ SOLN
INTRAMUSCULAR | Status: AC | PRN
  Administered 2022-12-02: 25 ug via INTRAVENOUS

## 2022-12-02 MED ORDER — CEFAZOLIN SODIUM-DEXTROSE 2-4 GM/100ML-% IV SOLN
INTRAVENOUS | Status: AC
  Administered 2022-12-02: 2000 mg
  Filled 2022-12-02: qty 100

## 2022-12-02 MED ORDER — LIDOCAINE HCL 1 % IJ SOLN
INTRAMUSCULAR | Status: AC
  Administered 2022-12-02: 9 mL via INTRADERMAL
  Filled 2022-12-02: qty 20

## 2022-12-02 MED ORDER — FENTANYL CITRATE (PF) 100 MCG/2ML IJ SOLN
INTRAMUSCULAR | Status: AC
  Filled 2022-12-02: qty 2

## 2022-12-02 MED ORDER — MIDAZOLAM HCL 2 MG/2ML IJ SOLN
INTRAMUSCULAR | Status: AC | PRN
  Administered 2022-12-02: 1 mg via INTRAVENOUS

## 2022-12-02 MED ORDER — MIDAZOLAM HCL 2 MG/2ML IJ SOLN
INTRAMUSCULAR | Status: AC
  Filled 2022-12-02: qty 2

## 2022-12-02 NOTE — Care Management Important Message (Signed)
Important Message  Patient Details  Name: [name] MRN: [id number] Date of Birth: [birth year]   Medicare Important Message Given:  Yes     Hannah Beat 12/02/2022, 3:44 PM

## 2022-12-02 NOTE — Progress Notes (Signed)
Consulted by [facility] team to provide PleurX draining education to patient's significant other. I reviewed with her all the steps of draining a pleurX. We have set a time of 10 AM on 1/17 for me to teach her and the rest of the family caregivers.  The PleurX patient prescription form was placed in the shadow chart, it needs to be completed and signed by a provider so that it can be faxed to [facility] and supplies can be delivered once he is discharged.

## 2022-12-02 NOTE — Progress Notes (Signed)
PROGRESS NOTE    [name]  DEY:[id number] DOB: 18-Jul-1941 DOA: 11/29/2022 PCP: Shon Baton, MD    Brief Narrative:  [name] is a 82 y.o. 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 Collene Mares in February 2023, status post pacemaker, obesity, diabetes mellitus with a last A1c of 6.1, recently discharged from the hospital for recurrent pleural effusion which is probably malignant they drained about 1.3 L and he relates his breathing improved.  He denies productive cough fever or sick contacts.  He relates to significant decreased oral intake antibiotic 10 pound weight gain in the last 2 weeks.  Plan is for palliative pleurex catheter.    Assessment and Plan:  Recurrent malignant pleural effusion/pleuritic chest pain -outpatient plan is for pleurex -will hold coumadin and reverse INR in anticipation for pleurex -afebrile with no cough no leukocytosis is very unlikely there is infectious etiology. His pain is pleuritic with make ACS unlikely there is likely due to his current pleural effusion. -PRN O2   Permanent atrial fibrillation/  Complete heart block (HCC)/  Pacemaker-Medtr[employer]: Rate control, hold Coumadin check INR daily. -INR down   CKD (chronic kidney disease), stage IV (HCC) Creatinine stable   Infiltrative high-grade papillary urothelial carcinoma of the renal pelvis: Status post resection, he does not want any chemotherapy due to his life expectancy that was explained to him. -seems to be set up with hospice  Obesity Estimated body mass index is 33.36 kg/m as calculated from the following:   Height as of this encounter: 6' (1.829 m).   Weight as of this encounter: 111.6 kg.   DVT prophylaxis:     Code Status: DNR   Disposition Plan:  Level of care: Med-Surg Status  is: Inpatient Remains inpatient appropriate because: needs pleurex-- would like to go home as soon as possible after pleurex    Consultants:  IR    Subjective: Would like to go home ASAP  Objective: Vitals:   12/01/22 1307 12/01/22 2052 12/02/22 0500 12/02/22 0717  BP: 106/61 (!) 105/53 101/61 110/73  Pulse: 74 74 70 75  Resp: '18 18 18 17  '$ Temp: 97.9 F (36.6 C) 98.4 F (36.9 C) 98.6 F (37 C) 98.1 F (36.7 C)  TempSrc: Oral Oral Oral Oral  SpO2: 95% 95% 93% 94%  Weight:      Height:        Intake/Output Summary (Last 24 hours) at 12/02/2022 1256 Last data filed at 12/02/2022 0900 Gross per 24 hour  Intake 0 ml  Output --  Net 0 ml   Filed Weights   11/29/22 1254  Weight: 111.6 kg    Examination:   General: Appearance:    Obese male in no acute distress     Lungs:     Mild increased work of breathing  Heart:    Normal heart rate.    MS:   All extremities are intact.    Neurologic:   Sleeping-- will awaken       Data Reviewed: I have personally reviewed following labs and imaging studies  CBC: Recent Labs  Lab 11/26/22 1432 11/29/22 1320 11/30/22 0304 12/01/22 0306 12/02/22 0359  WBC 7.1 7.3 6.2 6.0 6.6  NEUTROABS 5.3 5.1  --   --   --   HGB 9.2* 9.4* 8.7* 8.6* 8.6*  HCT 30.3* 30.3*  29.4* 29.6* 29.4*  MCV 79.3* 78.5* 80.5 81.5 80.8  PLT 342 359 322 308 628   Basic Metabolic Panel: Recent Labs  Lab 11/26/22 1432 11/29/22 1320 11/30/22 0304 12/01/22 0306 12/02/22 0359  NA 140 136 137 138 136  K 4.4 4.3 4.3 4.2 4.2  CL 106 104 106 102 103  CO2 '25 23 23 23 '$ 21*  GLUCOSE 121* 126* 100* 109* 109*  BUN 45* 31* 33* 35* 36*  CREATININE 2.44* 2.06* 2.04* 2.30* 2.10*  CALCIUM 9.2 9.2 9.0 9.0 9.0   GFR: Estimated Creatinine Clearance: 36.2 mL/min (A) (by C-G formula based on SCr of 2.1 mg/dL (H)). Liver Function Tests: Recent Labs  Lab 11/26/22 1432 11/29/22 1320  AST 14* 15  ALT 11 10  ALKPHOS 89 90  BILITOT 0.4 0.4  PROT 7.2 6.8   ALBUMIN 3.2* 2.9*   No results for input(s): "LIPASE", "AMYLASE" in the last 168 hours. No results for input(s): "AMMONIA" in the last 168 hours. Coagulation Profile: Recent Labs  Lab 11/29/22 1320 11/29/22 2328 12/01/22 0306 12/01/22 1830 12/02/22 0359  INR 2.8* 3.0* 2.9* 2.3* 1.9*   Cardiac Enzymes: No results for input(s): "CKTOTAL", "CKMB", "CKMBINDEX", "TROPONINI" in the last 168 hours. BNP (last 3 results) No results for input(s): "PROBNP" in the last 8760 hours. HbA1C: No results for input(s): "HGBA1C" in the last 72 hours. CBG: No results for input(s): "GLUCAP" in the last 168 hours. Lipid Profile: No results for input(s): "CHOL", "HDL", "LDLCALC", "TRIG", "CHOLHDL", "LDLDIRECT" in the last 72 hours. Thyroid Function Tests: No results for input(s): "TSH", "T4TOTAL", "FREET4", "T3FREE", "THYROIDAB" in the last 72 hours. Anemia Panel: No results for input(s): "VITAMINB12", "FOLATE", "FERRITIN", "TIBC", "IRON", "RETICCTPCT" in the last 72 hours. Sepsis Labs: No results for input(s): "PROCALCITON", "LATICACIDVEN" in the last 168 hours.  No results found for this or any previous visit (from the past 240 hour(s)).       Radiology Studies: No results found.      Scheduled Meds:  allopurinol  300 mg Oral Daily   carvedilol  3.125 mg Oral BID   sodium chloride flush  3 mL Intravenous Q12H   Continuous Infusions:  sodium chloride      ceFAZolin (ANCEF) IV       LOS: 3 days    Time spent: 45 minutes spent on chart review, discussion with nursing staff, consultants, updating family and interview/physical exam; more than 50% of that time was spent in counseling and/or coordination of care.    Geradine Girt, DO [facility] Available via Epic secure chat 7am-7pm After these hours, please refer to coverage provider listed on [email or web address] 12/02/2022, 12:56 PM

## 2022-12-02 NOTE — Procedures (Signed)
Interventional Radiology Procedure Note  Procedure: Right tunneled pleural PleurX catheter placement  Complications: None  Estimated Blood Loss: < 10 mL  Findings: Tunneled 16 Fr PleurX placed from right mid-axillary approach and 2.4 L of fluid removed.  Venetia Night. Kathlene Cote, M.D Pager:  [phone]

## 2022-12-02 NOTE — Plan of Care (Signed)
  Problem: Education: Goal: Knowledge of General Education information will improve Description: Including pain rating scale, medication(s)/side effects and non-pharmacologic comfort measures Outcome: Progressing

## 2022-12-03 DIAGNOSIS — J910 Malignant pleural effusion: Secondary | ICD-10-CM | POA: Diagnosis not present

## 2022-12-03 LAB — CBC
HCT: 28.3 % — ABNORMAL LOW (ref 39.0–52.0)
Hemoglobin: 8.7 g/dL — ABNORMAL LOW (ref 13.0–17.0)
MCH: 23.9 pg — ABNORMAL LOW (ref 26.0–34.0)
MCHC: 30.7 g/dL (ref 30.0–36.0)
MCV: 77.7 fL — ABNORMAL LOW (ref 80.0–100.0)
Platelets: 306 10*3/uL (ref 150–400)
RBC: 3.64 MIL/uL — ABNORMAL LOW (ref 4.22–5.81)
RDW: 15.3 % (ref 11.5–15.5)
WBC: 7.6 10*3/uL (ref 4.0–10.5)
nRBC: 0 % (ref 0.0–0.2)

## 2022-12-03 LAB — PROTIME-INR
INR: 1.4 — ABNORMAL HIGH (ref 0.8–1.2)
Prothrombin Time: 16.6 seconds — ABNORMAL HIGH (ref 11.4–15.2)

## 2022-12-03 LAB — BASIC METABOLIC PANEL
Anion gap: 10 (ref 5–15)
BUN: 34 mg/dL — ABNORMAL HIGH (ref 8–23)
CO2: 23 mmol/L (ref 22–32)
Calcium: 8.6 mg/dL — ABNORMAL LOW (ref 8.9–10.3)
Chloride: 102 mmol/L (ref 98–111)
Creatinine, Ser: 2.07 mg/dL — ABNORMAL HIGH (ref 0.61–1.24)
GFR, Estimated: 32 mL/min — ABNORMAL LOW (ref >60)
Glucose, Bld: 112 mg/dL — ABNORMAL HIGH (ref 70–99)
Potassium: 4.1 mmol/L (ref 3.5–5.1)
Sodium: 135 mmol/L (ref 135–145)

## 2022-12-03 MED ORDER — HYDROCODONE-ACETAMINOPHEN 5-325 MG PO TABS: 1.0000 | ORAL_TABLET | ORAL | 0 refills | Status: DC | PRN

## 2022-12-03 NOTE — Discharge Summary (Signed)
Physician Discharge Summary  [name] QIO:[id number] DOB: 1941-09-28 DOA: 11/29/2022  PCP: Shon Baton, MD  Admit date: 11/29/2022 Discharge date: 12/03/2022  Admitted From: home Disposition:  home with hospice  Recommendations for Outpatient Follow-up:  Follow up with PCP in 1-2 weeks  Home Health: none Equipment/Devices: none  Discharge Condition: stable CODE STATUS: DNR Diet Orders (From admission, onward)     Start     Ordered   12/02/22 1707  Diet regular Room service appropriate? Yes; Fluid consistency: Thin  Diet effective now       Question Answer Comment  Room service appropriate? Yes   Fluid consistency: Thin      12/02/22 1707            HPI: Per admitting MD, [name] is a 82 y.o. 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 Collene Mares in February 2023, status post pacemaker, obesity, diabetes mellitus with a last A1c of 6.1, recently discharged from the hospital for recurrent pleural effusion which is probably malignant they drained about 1.3 L and he relates his breathing improved.  He denies productive cough fever or sick contacts.  He relates to significant decreased oral intake antibiotic 10 pound weight gain in the last 2 weeks.   Hospital Course / Discharge diagnoses: Principal Problem:   Malignant pleural effusion Active Problems:   Obesity (BMI 30-39.9)   Essential hypertension, benign   Permanent atrial fibrillation (HCC)   Complete heart block (HCC)   Pacemaker-[employer]   Chronic systolic heart failure (HCC)   CKD (chronic kidney disease), stage IV (HCC)  Principal problem Recurrent malignant pleural effusion/pleuritic chest pain -IR was consulted and patient underwent Pleurx drain placement.  Teaching has been provided, he will be discharged  home in stable condition, and will have follow-up in IR clinic.  He is afebrile, normal WBC, very unlikely that there is any infectious etiology here.  Resume Coumadin post Pleurx placement  Active problems Permanent atrial fibrillation/  Complete heart block / PPM -resume home medications on discharge CKD (chronic kidney disease), stage IV (HCC) -Creatinine stable Infiltrative high-grade papillary urothelial carcinoma of the renal pelvis -Status post resection, he does not want any chemotherapy due to his life expectancy that was explained to him. Home with hospice Obesity -Estimated body mass index is 33.36 kg/m    Sepsis ruled out   Discharge Instructions  Discharge Instructions     Ambulatory Pleural Drainage Schedule   Complete by: As directed    Drain initially twice weekly, up to max of 2L.      Allergies as of 12/03/2022       Reactions   Codeine    "Crazy reaction" per Dr. Jenny Reichmann Russo's history 08/06/21   Oxycodone Other (See Comments)   Makes feel funny   Sertraline Hcl Other (See Comments)   Makes feel funny   Sitagliptin Rash        Medication List     STOP taking these medications    allopurinol 300 MG tablet Commonly known as: ZYLOPRIM   methocarbamol 500 MG tablet Commonly known as: ROBAXIN       TAKE these medications    carvedilol 3.125 MG tablet Commonly known as: COREG Take 1 tablet (3.125 mg total) by mouth 2 (two) times daily. Please schedule overdue appt for future refills. 2nd  attempt. What changed: additional instructions   furosemide 20 MG tablet Commonly known as: LASIX Take 20 mg by mouth daily as needed for edema.   HYDROcodone-acetaminophen 5-325 MG tablet Commonly known as: NORCO/VICODIN Take 1-2 tablets by mouth every 4 (four) hours as needed for moderate pain.   losartan 50 MG tablet Commonly known as: COZAAR TAKE 1 TABLET BY MOUTH ONCE DAILY.  PLEASE MAKE APPT WITH DR. ALLRED BEFORE ANYMORE REFILLS. What changed: See  the new instructions.   metFORMIN 500 MG tablet Commonly known as: GLUCOPHAGE Take 500 mg by mouth at bedtime.   warfarin 5 MG tablet Commonly known as: COUMADIN Take 1.5 tablets (7.5 mg total) by mouth daily. Take 7.5 mg by mouth daily What changed: additional instructions        Follow-up Information     Shon Baton, MD Follow up.   Specialty: Internal Medicine Contact information: [place] [id number] [place] [facility].   Specialty: Hospice and Palliative Medicine Contact information: [place] [address] [phone]                Consultations: IR  Procedures/Studies:  IR PERC PLEURAL DRAIN W/INDWELL CATH W/IMG GUIDE  Result Date: 12/02/2022 CLINICAL DATA:  Right-sided malignant pleural effusion and need for tunneled pleural drainage catheter placement. EXAM: INSERTION OF TUNNELED PLEURAL DRAINAGE CATHETER IN RIGHT PLEURAL SPACE ANESTHESIA/SEDATION: Moderate (conscious) sedation was employed during this procedure. A total of Versed 1.0 mg and Fentanyl 25 mcg was administered intravenously by radiology nursing. Moderate Sedation Time: 14 minutes. The patient's level of consciousness and vital signs were monitored continuously by radiology nursing throughout the procedure under my direct supervision. MEDICATIONS: 2 G IV ANCEF. Antibiotic was administered in an appropriate time interval for the procedure. FLUOROSCOPY: 6 seconds.  Less than 1 mGy. PROCEDURE: The procedure, risks, benefits, and alternatives were explained to the patient. Questions regarding the procedure were encouraged and answered. The patient understands and consents to the procedure. A time-out was performed prior to initiating the procedure. Ultrasound was used to localize pleural fluid on the right. An ultrasound image was saved and recorded. The right lateral chest wall was prepped with chlorhexidine in a sterile  fashion, and a sterile drape was applied covering the operative field. A sterile gown and sterile gloves were used for the procedure. Local anesthesia was provided with 1% Lidocaine. Ultrasound image documentation was performed. Fluoroscopy during the procedure and fluoroscopic spot radiograph confirms appropriate catheter position. After creating a small skin incision, a 19 gauge needle was advanced into the pleural cavity from a lateral mid axillary approach under ultrasound guidance. A guide wire was then advanced under fluoroscopy into the pleural space. Pleural access was dilated serially and a 16-French peel-away sheath placed. A 16 French tunneled PleurX catheter was placed. This was tunneled from an incision 5 cm below the pleural access to the access site. The catheter was advanced through the peel-away sheath. The sheath was then removed. Final catheter positioning was confirmed with a fluoroscopic spot image. The access incision was closed with subcuticular 4-0 Vicryl. Dermabond was applied to the incision. A Prolene retention suture was applied at the catheter exit site. Large volume thoracentesis was performed through the new catheter utilizing vacuum bottles. COMPLICATIONS: None. FINDINGS: The catheter was placed via the right lateral chest wall. Approximately 2.4 liters of pleural fluid was able to be removed after catheter placement. IMPRESSION: Placement of permanent,  tunneled right pleural drainage catheter via lateral approach. 2.4 liters of pleural fluid was removed today after catheter placement. Electronically Signed   By: Aletta Edouard M.D.   On: 12/02/2022 16:38   DG Chest 2 View  Result Date: 11/29/2022 CLINICAL DATA:  Chest pain and shortness of breath. EXAM: CHEST - 2 VIEW COMPARISON:  Chest x-ray dated 11/15/2022 FINDINGS: Heart size and mediastinal contours are stable. RIGHT chest wall pacemaker/ICD apparatus appears stable in position. Increased opacity at the RIGHT lung base.  LEFT lung remains clear. No pneumothorax is seen. No acute-appearing osseous abnormality. IMPRESSION: Increased opacity at the RIGHT lung base, likely moderate-sized pleural effusion and associated compressive atelectasis. Electronically Signed   By: Franki Cabot M.D.   On: 11/29/2022 14:19   VAS [place] LOWER EXTREMITY VENOUS (DVT)  Result Date: 11/17/2022  Lower Venous DVT Study Patient Name:  KODEY XUE  Date of Exam:   11/15/2022 Medical Rec #: [id number]      Accession #:    [id number] Date of Birth: 1941/01/27      Patient Gender: M Patient Age:   11 years Exam Location:  [facility] Procedure:      VAS [place] LOWER EXTREMITY VENOUS (DVT) Referring Phys: Myrene Buddy --------------------------------------------------------------------------------  Indications: Swelling.  Limitations: Poor ultrasound/tissue interfaceat calf level. Comparison Study: No prior studies. Performing Technologist: Darlin Coco RDMS, RVT  Examination Guidelines: A complete evaluation includes B-mode imaging, spectral Doppler, color Doppler, and power Doppler as needed of all accessible portions of each vessel. Bilateral testing is considered an integral part of a complete examination. Limited examinations for reoccurring indications may be performed as noted. The reflux portion of the exam is performed with the patient in reverse Trendelenburg.  +-----+---------------+---------+-----------+----------+--------------+ RIGHTCompressibilityPhasicitySpontaneityPropertiesThrombus Aging +-----+---------------+---------+-----------+----------+--------------+ CFV  Full           Yes      Yes                                 +-----+---------------+---------+-----------+----------+--------------+   +---------+---------------+---------+-----------+----------+-------------------+ LEFT     CompressibilityPhasicitySpontaneityPropertiesThrombus Aging       +---------+---------------+---------+-----------+----------+-------------------+ CFV      Full           Yes      Yes                                      +---------+---------------+---------+-----------+----------+-------------------+ SFJ      Full                                                             +---------+---------------+---------+-----------+----------+-------------------+ FV Prox  Full                                                             +---------+---------------+---------+-----------+----------+-------------------+ FV Mid   Full                                                             +---------+---------------+---------+-----------+----------+-------------------+  FV DistalFull                                                             +---------+---------------+---------+-----------+----------+-------------------+ PFV      Full                                                             +---------+---------------+---------+-----------+----------+-------------------+ POP      Full           Yes      Yes                                      +---------+---------------+---------+-----------+----------+-------------------+ PTV      Full                                                             +---------+---------------+---------+-----------+----------+-------------------+ PERO     Full                                         Some segments not                                                         well visualized                                                           secondary to tissue                                                       properties          +---------+---------------+---------+-----------+----------+-------------------+     Summary: RIGHT: - No evidence of common femoral vein obstruction.  LEFT: - There is no evidence of deep vein thrombosis in the lower extremity. However, portions  of this examination were limited- see technologist comments above.  - No cystic structure found in the popliteal fossa.  *See table(s) above for measurements and observations. Electronically signed by Jamelle Haring on 11/17/2022 at 10:13:03 AM.    Final    CT CHEST WO CONTRAST  Result Date: 11/15/2022 CLINICAL DATA:  82 year old male with pleural effusion. EXAM: CT CHEST WITHOUT CONTRAST TECHNIQUE: Multidetector CT imaging of the chest was performed  following the standard protocol without IV contrast. RADIATION DOSE REDUCTION: This exam was performed according to the departmental dose-optimization program which includes automated exposure control, adjustment of the mA and/or kV according to patient size and/or use of iterative reconstruction technique. COMPARISON:  11/15/2022 chest radiograph, 09/16/2021 abdominal and pelvic CT and prior studies. FINDINGS: Please note that parenchymal and vascular abnormalities may be missed as intravenous contrast was not administered. Cardiovascular: Heart size is within normal limits. Pacemaker leads are present. Coronary artery and aortic atherosclerotic calcifications are present. There is no evidence of thoracic aortic aneurysm or pericardial effusion. Mediastinum/Nodes: No enlarged mediastinal or axillary lymph nodes. Thyroid gland, trachea, and esophagus demonstrate no significant findings. Lungs/Pleura: A 3.6 x 2 cm soft tissue density along the anterior RIGHT UPPER lung appears to be extra pulmonary and likely pleural (images 43-50: Series 3). Probable focal RIGHT pleural thickening (83:3) is also identified. A small to moderate RIGHT pleural effusion is noted with RIGHT LOWER lung atelectasis. A 7 mm LEFT LOWER lobe nodule (125:5) and a 4 mm LEFT LOWER lobe nodule (104:5) are present. Minimal LEFT basilar atelectasis noted. There is no evidence of pneumothorax or LEFT pleural effusion. Upper Abdomen: A 2.5 x 1.2 cm LEFT adrenal nodule is new from 09/16/2021.  Musculoskeletal: No acute or suspicious bony abnormalities are noted. IMPRESSION: 1. 3.6 x 2 cm soft tissue along the anterior RIGHT UPPER lung, likely pleural. Focal RIGHT pleural thickening more inferiorly. Small to moderate RIGHT pleural effusion with RIGHT LOWER lung atelectasis. This is suspicious for malignancy/malignant effusion. 2. 2.5 x 1.2 cm LEFT adrenal nodule, new from 09/16/2021, possibly representing metastatic disease. 3. 7 mm LEFT LOWER lobe nodule and 4 mm LEFT LOWER lobe nodule. Indeterminate but may represent metastases. 4. PET-CT may be helpful for further evaluation of the above findings. 5. Coronary artery disease and Aortic Atherosclerosis (ICD10-I70.0). Electronically Signed   By: Margarette Canada M.D.   On: 11/15/2022 14:27   ECHOCARDIOGRAM COMPLETE  Result Date: 11/15/2022    ECHOCARDIOGRAM REPORT   Patient Name:   JOE GEE Date of Exam: 11/15/2022 Medical Rec #:  [id number]     Height:       72.0 in Accession #:    [id number]    Weight:       246.5 lb Date of Birth:  [birth year]     BSA:          2.328 m Patient Age:    82 years      BP:           97/56 mmHg Patient Gender: M             HR:           75 bpm. Exam Location:  Inpatient Procedure: 2D Echo Indications:    dyspnea  History:        Patient has prior history of Echocardiogram examinations, most                 recent 11/15/2015. Pacemaker, chronic kidney disease,                 Arrythmias:Atrial Fibrillation; Risk Factors:Hypertension.  Sonographer:    Johny Chess RDCS Referring Phys: [id number] CHRISTOPHER P DANFORD  Sonographer Comments: Image acquisition challenging due to respiratory motion. IMPRESSIONS  1. Left ventricular ejection fraction, by estimation, is 60 to 65%. The left ventricle has normal function. The left ventricle has no regional wall motion abnormalities. Left ventricular diastolic parameters are indeterminate.  2.  Right ventricular systolic function is normal. The right ventricular size is normal.  There is normal pulmonary artery systolic pressure.  3. Left atrial size was mildly dilated.  4. The mitral valve is normal in structure. Mild mitral valve regurgitation. No evidence of mitral stenosis.  5. The aortic valve is tricuspid. There is mild calcification of the aortic valve. Aortic valve regurgitation is not visualized. Aortic valve sclerosis is present, with no evidence of aortic valve stenosis.  6. The inferior vena cava is normal in size with greater than 50% respiratory variability, suggesting right atrial pressure of 3 mmHg. FINDINGS  Left Ventricle: Left ventricular ejection fraction, by estimation, is 60 to 65%. The left ventricle has normal function. The left ventricle has no regional wall motion abnormalities. The left ventricular internal cavity size was normal in size. There is  no left ventricular hypertrophy. Left ventricular diastolic parameters are indeterminate. Right Ventricle: The right ventricular size is normal. No increase in right ventricular wall thickness. Right ventricular systolic function is normal. There is normal pulmonary artery systolic pressure. The tricuspid regurgitant velocity is 2.74 m/s, and  with an assumed right atrial pressure of 3 mmHg, the estimated right ventricular systolic pressure is 33.8 mmHg. Left Atrium: Left atrial size was mildly dilated. Right Atrium: Right atrial size was normal in size. Pericardium: There is no evidence of pericardial effusion. Mitral Valve: The mitral valve is normal in structure. Mild mitral valve regurgitation. No evidence of mitral valve stenosis. Tricuspid Valve: The tricuspid valve is normal in structure. Tricuspid valve regurgitation is trivial. No evidence of tricuspid stenosis. Aortic Valve: The aortic valve is tricuspid. There is mild calcification of the aortic valve. Aortic valve regurgitation is not visualized. Aortic valve sclerosis is present, with no evidence of aortic valve stenosis. Pulmonic Valve: The pulmonic valve  was normal in structure. Pulmonic valve regurgitation is trivial. No evidence of pulmonic stenosis. Aorta: The aortic root is normal in size and structure. Venous: The inferior vena cava is normal in size with greater than 50% respiratory variability, suggesting right atrial pressure of 3 mmHg. IAS/Shunts: No atrial level shunt detected by color flow Doppler. Additional Comments: A device lead is visualized in the right ventricle.  LEFT VENTRICLE PLAX 2D LVIDd:         4.50 cm LVIDs:         3.20 cm LV PW:         1.10 cm LV IVS:        0.80 cm LVOT diam:     2.30 cm LV SV:         82 LV SV Index:   35 LVOT Area:     4.15 cm  RIGHT VENTRICLE RV S prime:     13.80 cm/s TAPSE (M-mode): 1.8 cm LEFT ATRIUM             Index        RIGHT ATRIUM           Index LA diam:        4.30 cm 1.85 cm/m   RA Area:     20.70 cm LA Vol (A2C):   88.1 ml 37.84 ml/m  RA Volume:   59.40 ml  25.51 ml/m LA Vol (A4C):   72.6 ml 31.18 ml/m LA Biplane Vol: 83.0 ml 35.65 ml/m  AORTIC VALVE LVOT Vmax:   110.00 cm/s LVOT Vmean:  73.100 cm/s LVOT VTI:    0.197 m  AORTA Ao Root diam: 3.10 cm Ao Asc  diam:  3.70 cm TRICUSPID VALVE TR Peak grad:   30.0 mmHg TR Vmax:        274.00 cm/s  SHUNTS Systemic VTI:  0.20 m Systemic Diam: 2.30 cm Candee Furbish MD Electronically signed by Candee Furbish MD Signature Date/Time: 11/15/2022/12:39:26 PM    Final    US THORACENTESIS ASP PLEURAL SPACE W/IMG GUIDE  Result Date: 11/15/2022 INDICATION: 82 year old male. History of CHF. admitted for shortness of breath. Found to have a right-sided pleural effusion. Request is for therapeutic and diagnostic right-sided thoracentesis EXAM: ULTRASOUND GUIDED RIGHT-SIDED THERAPEUTIC AND DIAGNOSTIC THORACENTESIS MEDICATIONS: Lidocaine 1% 10 mL COMPLICATIONS: None immediate. PROCEDURE: An ultrasound guided thoracentesis was thoroughly discussed with the patient and questions answered. The benefits, risks, alternatives and complications were also discussed. The patient  understands and wishes to proceed with the procedure. Written consent was obtained. Ultrasound was performed to localize and mark an adequate pocket of fluid in the right chest. The area was then prepped and draped in the normal sterile fashion. 1% Lidocaine was used for local anesthesia. Under ultrasound guidance a 6 Fr Safe-T-Centesis catheter was introduced. Thoracentesis was performed. The catheter was removed and a dressing applied. FINDINGS: A total of approximately 1.3 L of straw-colored fluid was removed. Samples were sent to the laboratory as requested by the clinical team. IMPRESSION: Successful ultrasound guided therapeutic and diagnostic right-sided thoracentesis yielding 1.3 L of pleural fluid. Read by: Rushie Nyhan, NP Electronically Signed   By: Ruthann Cancer M.D.   On: 11/15/2022 11:22   DG CHEST PORT 1 VIEW  Result Date: 11/15/2022 CLINICAL DATA:  Status post thoracentesis EXAM: PORTABLE CHEST 1 VIEW COMPARISON:  November 14, 2022 FINDINGS: No pneumothorax after right thoracentesis. The right pleural effusion has improved but persists. Opacity underlies the effusion. No other interval changes. IMPRESSION: 1. No pneumothorax after right thoracentesis. 2. The right pleural effusion has improved but persists. Opacity underlies the effusion. 3. No other changes. Electronically Signed   By: Dorise Bullion III M.D.   On: 11/15/2022 10:30   DG Chest 2 View  Result Date: 11/14/2022 CLINICAL DATA:  Pleural effusion shortness of breath EXAM: CHEST - 2 VIEW COMPARISON:  11/14/2022, 12/18/2008 FINDINGS: Right-sided pacing device as before. Cardiomegaly with vascular congestion. Moderate right-sided pleural effusion with airspace disease at the right base. No pneumothorax. IMPRESSION: Cardiomegaly with vascular congestion. Moderate right pleural effusion with airspace disease at the right base which may be due to atelectasis or pneumonia. Electronically Signed   By: Donavan Foil M.D.   On:  11/14/2022 19:11   DG Chest Portable 1 View  Result Date: 11/14/2022 CLINICAL DATA:  Shortness of breath. EXAM: PORTABLE CHEST 1 VIEW COMPARISON:  December 18, 2008. FINDINGS: Stable cardiomediastinal silhouette. Left lung is clear. Right-sided pacemaker is noted. Interval development of mild to moderate right pleural effusion with associated atelectasis or infiltrate. Bony thorax is unremarkable. IMPRESSION: Mild to moderate right pleural effusion with associated right basilar atelectasis or infiltrate. Electronically Signed   By: Marijo Conception M.D.   On: 11/14/2022 16:08     Subjective: - no chest pain, shortness of breath, no abdominal pain, nausea or vomiting.   Discharge Exam: BP 108/66 (BP Location: Left Arm)   Pulse 80   Temp 97.9 F (36.6 C) (Oral)   Resp 15   Ht 6' (1.829 m)   Wt 111.6 kg   SpO2 98%   BMI 33.36 kg/m   General: Pt is alert, awake, not in acute distress Cardiovascular: RRR,  S1/S2 +, no rubs, no gallops Respiratory: CTA bilaterally, no wheezing, no rhonchi Abdominal: Soft, NT, ND, bowel sounds + Extremities: no edema, no cyanosis    The results of significant diagnostics from this hospitalization (including imaging, microbiology, ancillary and laboratory) are listed below for reference.     Microbiology: No results found for this or any previous visit (from the past 240 hour(s)).   Labs: Basic Metabolic Panel: Recent Labs  Lab 11/29/22 1320 11/30/22 0304 12/01/22 0306 12/02/22 0359 12/03/22 0431  NA 136 137 138 136 135  K 4.3 4.3 4.2 4.2 4.1  CL 104 106 102 103 102  CO2 '23 23 23 '$ 21* 23  GLUCOSE 126* 100* 109* 109* 112*  BUN 31* 33* 35* 36* 34*  CREATININE 2.06* 2.04* 2.30* 2.10* 2.07*  CALCIUM 9.2 9.0 9.0 9.0 8.6*   Liver Function Tests: Recent Labs  Lab 11/26/22 1432 11/29/22 1320  AST 14* 15  ALT 11 10  ALKPHOS 89 90  BILITOT 0.4 0.4  PROT 7.2 6.8  ALBUMIN 3.2* 2.9*   CBC: Recent Labs  Lab 11/26/22 1432 11/29/22 1320  11/30/22 0304 12/01/22 0306 12/02/22 0359 12/03/22 0431  WBC 7.1 7.3 6.2 6.0 6.6 7.6  NEUTROABS 5.3 5.1  --   --   --   --   HGB 9.2* 9.4* 8.7* 8.6* 8.6* 8.7*  HCT 30.3* 30.3* 29.4* 29.6* 29.4* 28.3*  MCV 79.3* 78.5* 80.5 81.5 80.8 77.7*  PLT 342 359 322 308 263 306   CBG: No results for input(s): "GLUCAP" in the last 168 hours. Hgb A1c No results for input(s): "HGBA1C" in the last 72 hours. Lipid Profile No results for input(s): "CHOL", "HDL", "LDLCALC", "TRIG", "CHOLHDL", "LDLDIRECT" in the last 72 hours. Thyroid function studies No results for input(s): "TSH", "T4TOTAL", "T3FREE", "THYROIDAB" in the last 72 hours.  Invalid input(s): "FREET3" Urinalysis    Component Value Date/Time   COLORURINE YELLOW 10/06/2008 [place] 10/06/2008 1044   LABSPEC 1.017 10/06/2008 1044   PHURINE 5.0 10/06/2008 1044   GLUCOSEU NEGATIVE 10/06/2008 1044   HGBUR NEGATIVE 10/06/2008 1044   BILIRUBINUR NEGATIVE 10/06/2008 1044   KETONESUR NEGATIVE 10/06/2008 1044   PROTEINUR NEGATIVE 10/06/2008 1044   UROBILINOGEN 0.2 10/06/2008 1044   NITRITE NEGATIVE 10/06/2008 1044   LEUKOCYTESUR  10/06/2008 1044    NEGATIVE MICROSCOPIC NOT DONE ON URINES WITH NEGATIVE PROTEIN, BLOOD, LEUKOCYTES, NITRITE, OR GLUCOSE <1000 mg/dL.    FURTHER DISCHARGE INSTRUCTIONS:   Get Medicines reviewed and adjusted: Please take all your medications with you for your next visit with your Primary MD   Laboratory/radiological data: Please request your Primary MD to go over all hospital tests and procedure/radiological results at the follow up, please ask your Primary MD to get all Hospital records sent to his/her office.   In some cases, they will be blood work, cultures and biopsy results pending at the time of your discharge. Please request that your primary care M.D. goes through all the records of your hospital data and follows up on these results.   Also Note the following: If you experience  worsening of your admission symptoms, develop shortness of breath, life threatening emergency, suicidal or homicidal thoughts you must seek medical attention immediately by calling 911 or calling your MD immediately  if symptoms less severe.   You must read complete instructions/literature along with all the possible adverse reactions/side effects for all the Medicines you take and that have been prescribed to you. Take any new Medicines after  you have completely understood and accpet all the possible adverse reactions/side effects.    Do not drive when taking Pain medications or sleeping medications (Benzodaizepines)   Do not take more than prescribed Pain, Sleep and Anxiety Medications. It is not advisable to combine anxiety,sleep and pain medications without talking with your primary care practitioner   Special Instructions: If you have smoked or chewed Tobacco  in the last 2 yrs please stop smoking, stop any regular Alcohol  and or any Recreational drug use.   Wear Seat belts while driving.   Please note: You were cared for by a hospitalist during your hospital stay. Once you are discharged, your primary care physician will handle any further medical issues. Please note that NO REFILLS for any discharge medications will be authorized once you are discharged, as it is imperative that you return to your primary care physician (or establish a relationship with a primary care physician if you do not have one) for your post hospital discharge needs so that they can reassess your need for medications and monitor your lab values.  Time coordinating discharge: 35 minutes  SIGNED:  Marzetta Board, MD, PhD 12/03/2022, 9:52 AM

## 2022-12-03 NOTE — Discharge Instructions (Signed)
Pleural drainage schedule to start 1/20. Drain and record twice a week for the first week, up to max of 1L until patient is only able to drain out 116m. If <1583mfor 3 consecutive drains then drain every other day. If <15081mor 3 consecutive drains every other day then call the practice that inserted the catheter for evaluation and possible removal. Interventional Radiology ([phone]

## 2022-12-03 NOTE — TOC Transition Note (Incomplete)
Transition of Care [facility]) - CM/SW Discharge Note   Patient Details  Name: [name] MRN: [id number] Date of Birth: 1941/03/12  [facility] CM/SW Contact:  Sharin Mons, RN Phone Number: 12/03/2022, 1:34 PM   Clinical Narrative:    Patient will DC to: home Anticipated DC date: 12/03/2022 Family notified: yes  Transport by:car   Per MD patient ready for DC today. RN, patient, patient's family and [place] with Manufacturing engineer aware of DC. [facility] will follow up with pt on tomorrow for home hospice care intake. Pt provided with 10 PleurX bottles/dressing  change kits. PleurX patient prescription form signed by MD.NCM to fax form to [place], [phone].  Post hospital follow up noted on AVS. Son to provide transportation to home.  RNCM will sign off for now as intervention is no longer needed. Please consult [place] again if new needs arise.   Final next level of care: Home w Hospice Care Barriers to Discharge: No Barriers Identified   Patient Goals and CMS Choice      Discharge Placement                         Discharge Plan and Services Additional resources added to the After Visit Summary for                                       Social Determinants of Health (SDOH) Interventions SDOH Screenings   Food Insecurity: No Food Insecurity (11/29/2022)  Housing: Low Risk  (11/29/2022)  Transportation Needs: No Transportation Needs (11/29/2022)  Utilities: Not At Risk (11/29/2022)  Tobacco Use: Low Risk  (12/02/2022)     Readmission Risk Interventions     No data to display

## 2022-12-03 NOTE — Progress Notes (Signed)
Patient's son and significant other/girlfriend taught draining procedure for pleurX. Son and girlfriend watched the video and son returned demonstrated all parts of the drainage procedure and is comfortable with doing this at home. Patient will be sent home with 10 PleurX bottles/dressing change kits.

## 2022-12-04 ENCOUNTER — Encounter: Payer: Self-pay | Admitting: Thoracic Surgery (Cardiothoracic Vascular Surgery)

## 2023-01-16 DEATH — deceased

## 2023-06-22 ENCOUNTER — Ambulatory Visit: Payer: Medicare Other
